# Patient Record
Sex: Female | Born: 1990 | Race: White | Hispanic: No | Marital: Single | Smoking: Never smoker
Health system: Southern US, Community
[De-identification: ages and names within clinical notes are randomized; demographics above are authoritative.]

## PROBLEM LIST (undated history)

## (undated) DIAGNOSIS — F419 Anxiety disorder, unspecified: Secondary | ICD-10-CM

## (undated) DIAGNOSIS — E282 Polycystic ovarian syndrome: Secondary | ICD-10-CM

## (undated) DIAGNOSIS — K589 Irritable bowel syndrome without diarrhea: Secondary | ICD-10-CM

## (undated) DIAGNOSIS — R5383 Other fatigue: Secondary | ICD-10-CM

## (undated) DIAGNOSIS — Z87440 Personal history of urinary (tract) infections: Secondary | ICD-10-CM

## (undated) DIAGNOSIS — K279 Peptic ulcer, site unspecified, unspecified as acute or chronic, without hemorrhage or perforation: Secondary | ICD-10-CM

## (undated) DIAGNOSIS — Z8619 Personal history of other infectious and parasitic diseases: Secondary | ICD-10-CM

## (undated) DIAGNOSIS — Z8719 Personal history of other diseases of the digestive system: Secondary | ICD-10-CM

## (undated) DIAGNOSIS — E063 Autoimmune thyroiditis: Secondary | ICD-10-CM

## (undated) DIAGNOSIS — G8929 Other chronic pain: Secondary | ICD-10-CM

## (undated) DIAGNOSIS — R51 Headache: Secondary | ICD-10-CM

## (undated) DIAGNOSIS — E049 Nontoxic goiter, unspecified: Secondary | ICD-10-CM

## (undated) DIAGNOSIS — R1013 Epigastric pain: Secondary | ICD-10-CM

## (undated) DIAGNOSIS — E079 Disorder of thyroid, unspecified: Secondary | ICD-10-CM

## (undated) DIAGNOSIS — E669 Obesity, unspecified: Secondary | ICD-10-CM

## (undated) DIAGNOSIS — N915 Oligomenorrhea, unspecified: Secondary | ICD-10-CM

## (undated) HISTORY — DX: Other fatigue: R53.83

## (undated) HISTORY — DX: Epigastric pain: R10.13

## (undated) HISTORY — DX: Anxiety disorder, unspecified: F41.9

## (undated) HISTORY — DX: Other chronic pain: G89.29

## (undated) HISTORY — DX: Disorder of thyroid, unspecified: E07.9

## (undated) HISTORY — DX: Nontoxic goiter, unspecified: E04.9

## (undated) HISTORY — DX: Autoimmune thyroiditis: E06.3

## (undated) HISTORY — PX: OTHER SURGICAL HISTORY: SHX169

## (undated) HISTORY — DX: Oligomenorrhea, unspecified: N91.5

## (undated) HISTORY — DX: Polycystic ovarian syndrome: E28.2

## (undated) HISTORY — DX: Peptic ulcer, site unspecified, unspecified as acute or chronic, without hemorrhage or perforation: K27.9

## (undated) HISTORY — DX: Obesity, unspecified: E66.9

## (undated) HISTORY — PX: WISDOM TOOTH EXTRACTION: SHX21

## (undated) HISTORY — DX: Personal history of other infectious and parasitic diseases: Z86.19

## (undated) HISTORY — PX: KNEE SURGERY: SHX244

## (undated) HISTORY — DX: Personal history of urinary (tract) infections: Z87.440

## (undated) HISTORY — PX: BAND HEMORRHOIDECTOMY: SHX1213

## (undated) HISTORY — DX: Personal history of other diseases of the digestive system: Z87.19

## (undated) HISTORY — DX: Headache: R51

---
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
## 2008-02-26 ENCOUNTER — Ambulatory Visit: Payer: Self-pay | Admitting: "Endocrinology

## 2008-05-14 ENCOUNTER — Telehealth: Payer: Self-pay | Admitting: Internal Medicine

## 2008-08-02 ENCOUNTER — Ambulatory Visit: Payer: Self-pay | Admitting: "Endocrinology

## 2009-05-11 IMAGING — CT CT ENTERO ABD W/CM
2 of 8 series · 15 of 46 positions shown, 19 images · IV contrast (Omnipaque 300)
Comparison: Abdominal ultrasound 05/16/2007.

CT ABDOMEN:

CLINICAL DATA: 16-year-old with nausea, vomiting, epigastric pain
and weight loss.  Question Crohn disease.

CT ABDOMEN AND PELVIS WITH CONTRAST (CT ENTEROGRAPHY)
TECHNIQUE: Multidetector CT of the abdomen and pelvis during bolus
administration of intravenous contrast. Negative oral contrast
VoLumen was given.
Contrast:  100 ml Zmnipaque-AJJ intravenously.

[Series 4: abd_pel entero 2.0 b30f st · axial · 0.80mm/px · z∈[-328,+52]mm · 12 of 297 slices shown, 16 images]
[im 29/297  soft-tissue]
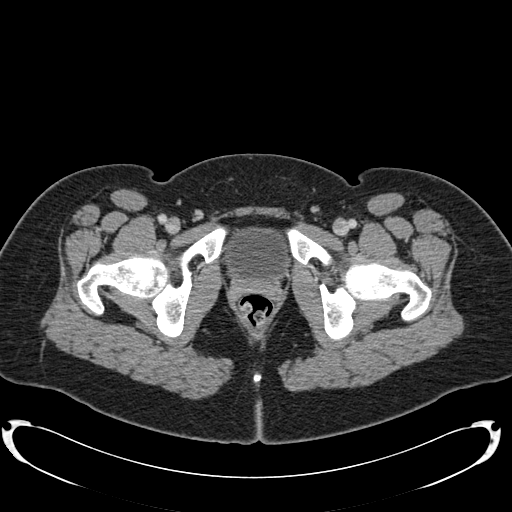
[im 29/297  bone]
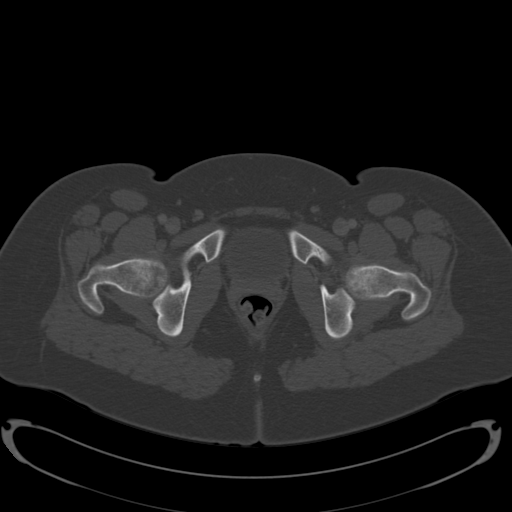
[im 57/297  soft-tissue]
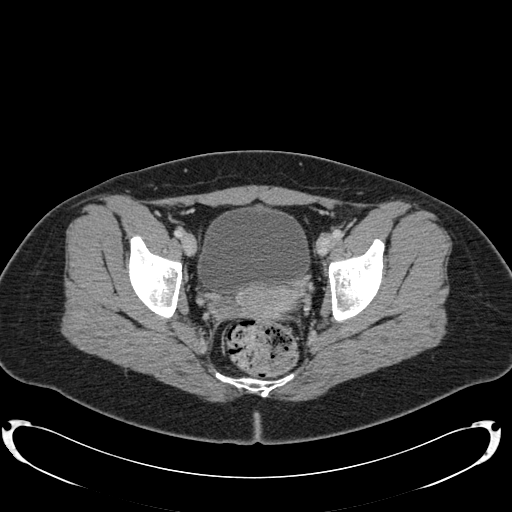
[im 85/297  soft-tissue]
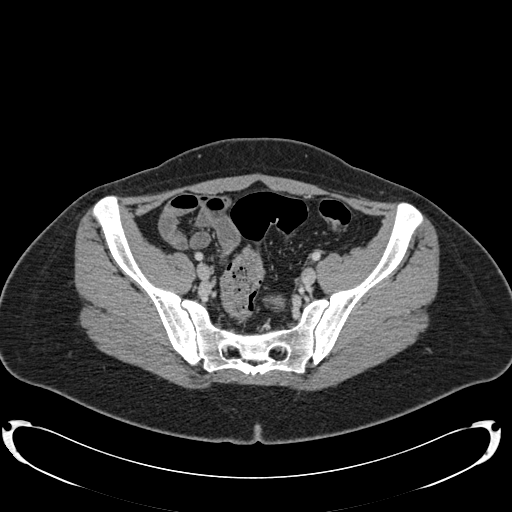
[im 113/297  soft-tissue]
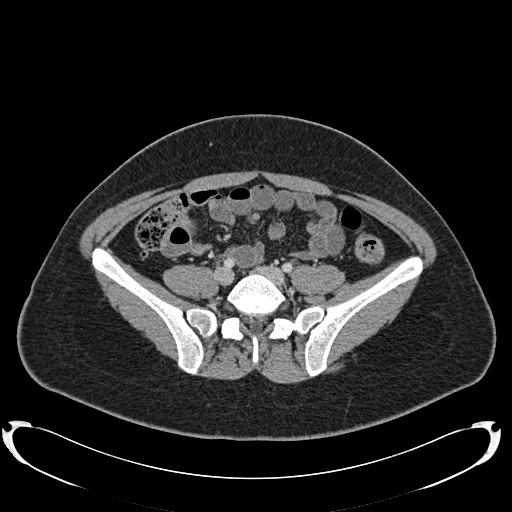
[im 141/297  soft-tissue]
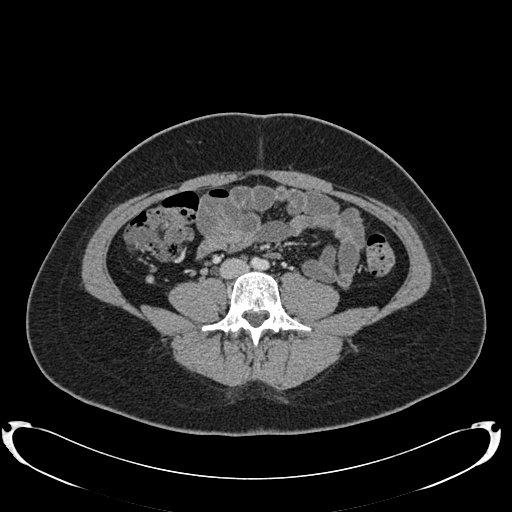
[im 170/297  soft-tissue]
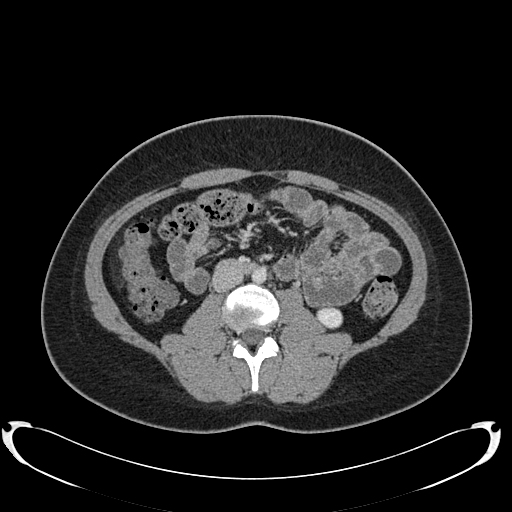
[im 198/297  soft-tissue]
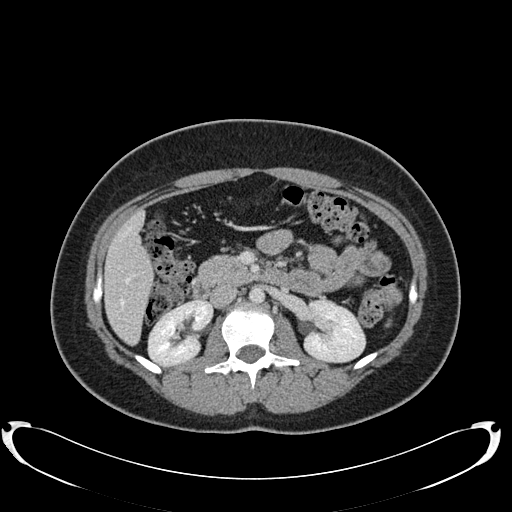
[im 226/297  soft-tissue]
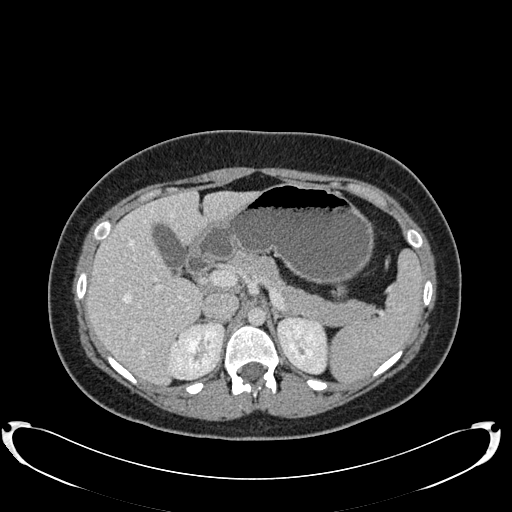
[im 240/297  lung]
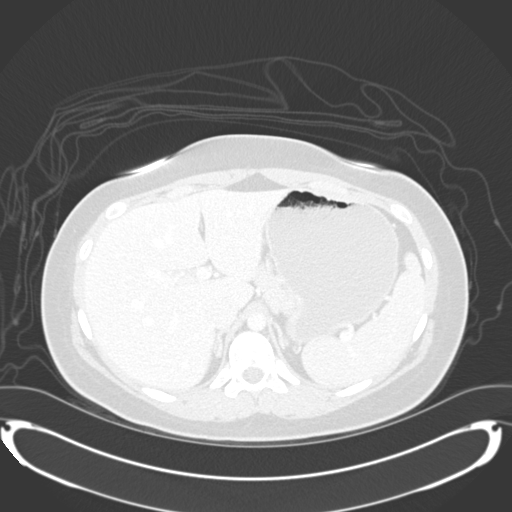
[im 254/297  soft-tissue]
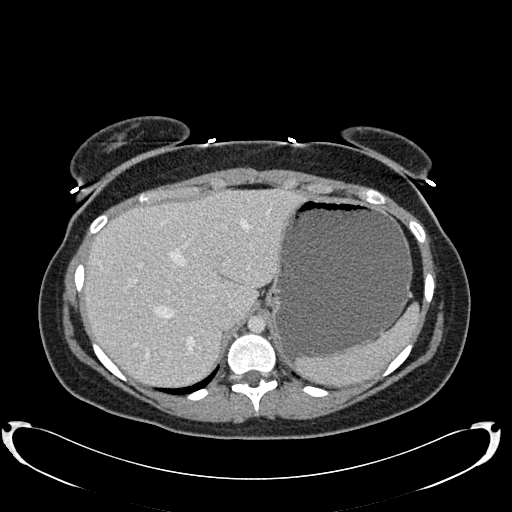
[im 254/297  lung]
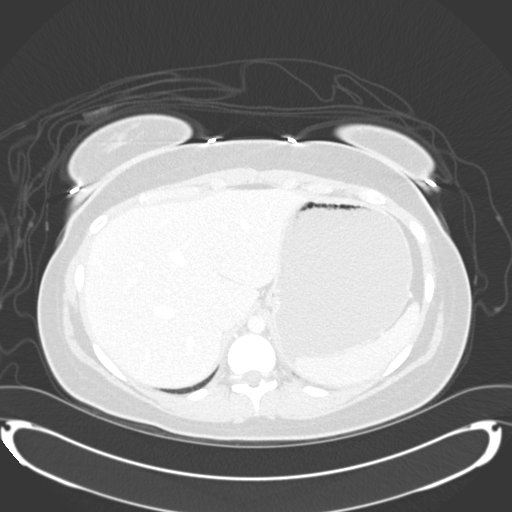
[im 254/297  bone]
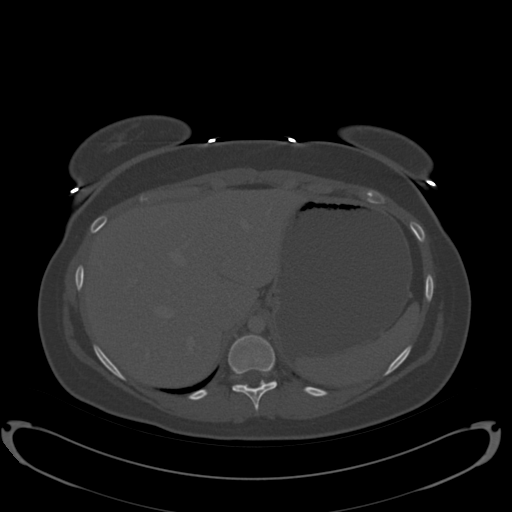
[im 268/297  lung]
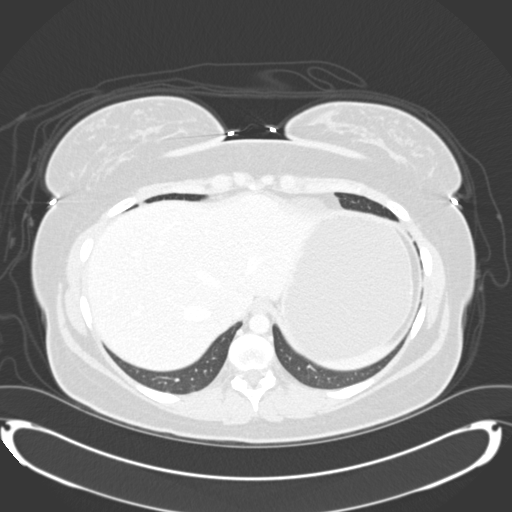
[im 282/297  soft-tissue]
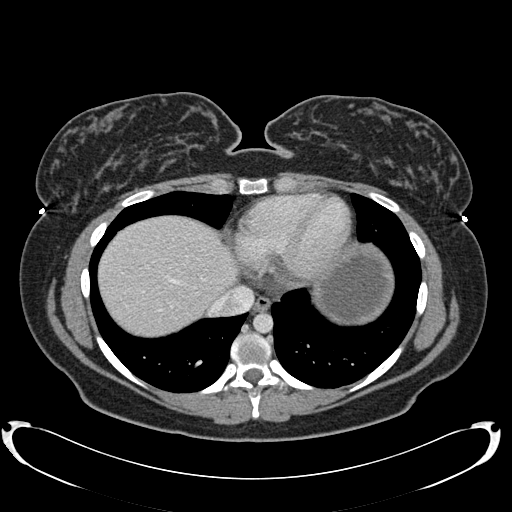
[im 282/297  lung]
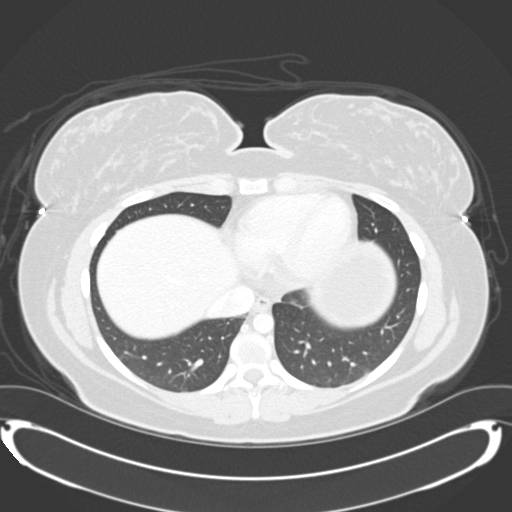

[Series 602: <mpr range> · coronal · 0.87mm/px · 3 of 50 slices shown]
[im 13/50  soft-tissue]
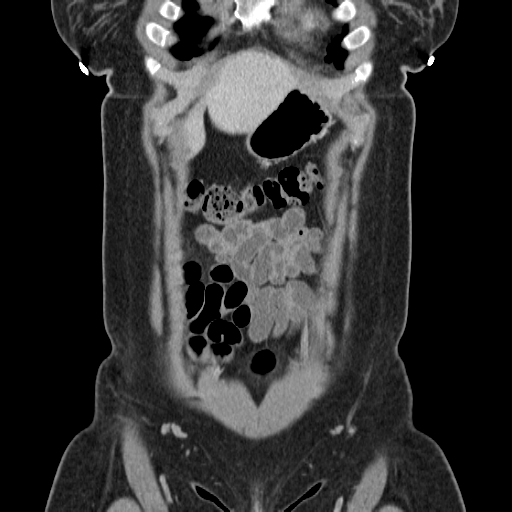
[im 25/50  soft-tissue]
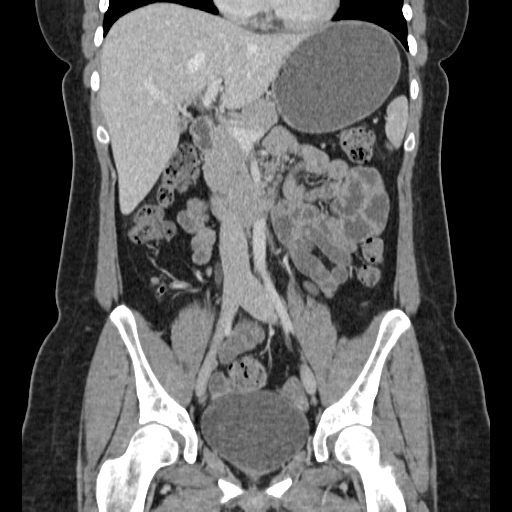
[im 37/50  soft-tissue]
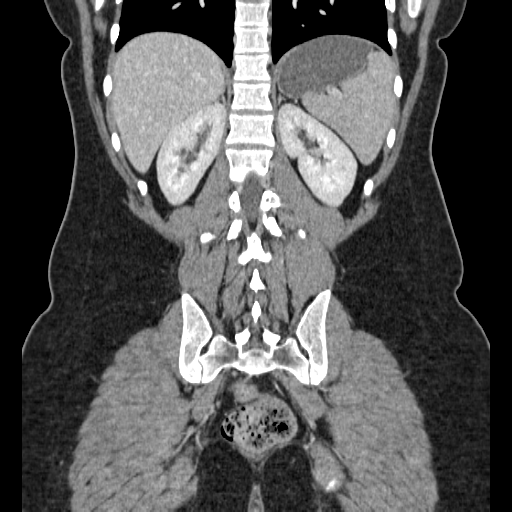

[15 of 46 positions shown; findings below may reference images not displayed]

FINDINGS: CT enterography images demonstrate no bowel wall
thickening, abnormal bowel wall enhancement or perienteric
inflammation.  Specifically, the terminal ileum and cecum appear
normal.  The appendix appears normal.  There are no enlarged
retroperitoneal lymph nodes.  There is no bowel distension or
ascites.

The lung bases are clear.  The gallbladder demonstrates a phrygian
cap but otherwise appears normal.  The liver, spleen, pancreas,
adrenal glands and kidneys appear normal.
IMPRESSION: Normal CT enterography.  No evidence of Crohn disease.

CT PELVIS:
FINDINGS: There is no bowel wall thickening, abnormal enhancement
or distension in the pelvis.  No pelvic mass, fluid collection or
inflammatory process is demonstrated.  The uterus and ovaries
appear normal.  There is no adenopathy or ascites.  The sacroiliac
joints appear normal without evidence of sacroiliitis.
IMPRESSION: Normal CT enterography of the pelvis.

## 2009-05-27 ENCOUNTER — Ambulatory Visit: Payer: Self-pay | Admitting: "Endocrinology

## 2009-10-08 ENCOUNTER — Ambulatory Visit (HOSPITAL_BASED_OUTPATIENT_CLINIC_OR_DEPARTMENT_OTHER): Admission: RE | Admit: 2009-10-08 | Discharge: 2009-10-08 | Payer: Self-pay | Admitting: Orthopaedic Surgery

## 2009-10-08 ENCOUNTER — Ambulatory Visit: Payer: Self-pay | Admitting: Diagnostic Radiology

## 2010-03-18 ENCOUNTER — Ambulatory Visit: Payer: Self-pay | Admitting: "Endocrinology

## 2010-12-25 ENCOUNTER — Other Ambulatory Visit: Payer: Self-pay | Admitting: "Endocrinology

## 2010-12-25 LAB — CLIENT PROFILE 3332: TSH: 1.692 u[IU]/mL (ref 0.350–4.500)

## 2010-12-29 NOTE — Assessment & Plan Note (Signed)
[facility]                         GASTROENTEROLOGY OFFICE NOTE   NAME:Catherine [name], Catherine [name]                      MRN:          [id number]  DATE:11/03/2007                            DOB:          04/06/1991    REFERRING PHYSICIAN:  David Stall, M.D.   REASON FOR VISIT:  Nausea, vomiting, and abdominal pain.   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  She is referred through the courtesy of Dr. Fransico Michael  regarding nausea, vomiting, and abdominal discomfort.  The patient is  accompanied today by her mother who provides a large portion of the  history.  Apparently,Catherine [name] has had a sensitive stomach for many  years as well as intermittent problems with constipation.  At one point,  she had problems with persistant left lower quadrant discomfort for  which she underwent a flexible sigmoidoscopy with Nance Pew in [employer].  Last September, she developed an acute gastroenteritis-type  illness with nausea, vomiting, and diarrhea.  She subsequently saw Dr.  Anselmo Rod ( a [facility] Gastroenterologist) for these persistant  symptoms.  She underwent an extensive workup, including upper endoscopy  and colonoscopy.  Upper endoscopy revealed nonspecific mild gastric  erosions.  Biopsies were obtained and revealed nonspecific gastritis.  No evidence of eosinophilic gastritis.  Biopsies of the duodenum were  normal.  No evidence of sprue.  Colonoscopy was entirely normal.  The  ileum was said to have some small erosions.  However, biopsies returned  normal.  Other testing included a normal abdominal ultrasound, normal  hepatobiliary scan, and normal blood work, including CBC, comprehensive  metabolic panel, and thyroid-stimulating hormone.  She also had multiple  stool studies which were unremarkable.  Finally, a breath test was  ordered.  There was no evidence of lactose malabsorption.  There was a  question of mild bacterial overgrowth for which Flagyl and Flora Q  treatment were ordered.  The patient and her mother were concerned that  the ibuprofen that she uses for bad menstrual cramps as well as birth  control pill for her ovarian disease may have contributed to symptoms.  As such, these were discontinued in the late fall.  Through December,  January, and February, she did well.  In February, she resumed birth  control as well as ibuprofen.  Now, for the past several weeks, she has  been having intermittent problems with nausea, vomiting, and epigastric  pain.  Some diarrhea, though not prominent.  She is now referred.  The  patient denies that the pain precedes the episodes of nausea and  vomiting.  She does have some reflux symptoms as manifested by  substernal burning.  For this, she has been on Nexium 40 mg b.i.d.  This  helps.  She does report that at times her vomitus contains food stuff  eaten many hours previous.  No significant nocturnal symptoms.  She has  had no change in her weight since last fall. She has chronic migraines  and wants an alternative to NSAIDS.  They plan to see a neurologist in  the near future.  Her mother is concerned that this illness may affect  her  school attendance.   PAST MEDICAL HISTORY:  1. Hashimoto's thyroiditis.  Apparently euthyroid at this time.  2. Migraine headaches.  3. Polycystic ovarian disease.  4. Gastroesophageal reflux disease.   PAST SURGICAL HISTORY:  1. Hemorrhoidectomy.  2. Surgery on her femur in 2003.   FAMILY HISTORY:  Maternal grandmother with colon cancer as well as colon  polyps.  Grandfather with diabetes.   SOCIAL HISTORY:  The patient is single without children.  She lives with  her parents.  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  She does not smoke or use alcohol.   REVIEW OF SYSTEMS:  Per diagnostic  evaluation form.  Last menstrual  period October 15, 2007.   PHYSICAL EXAMINATION:  GENERAL:  Pleasant, well-appearing female in no  acute distress.  VITAL SIGNS:  Blood pressure 100/58, heart rate 80 and regular, weight  174 pounds.  She is 5 feet 4 inches in height.  HEENT:  Sclerae are anicteric.  Conjunctivae are pink.  Oral mucosa is  intact.  There is no thrush.  There are no aphthous ulcers.  No  adenopathy.  LUNGS:  Clear.  HEART:  Regular.  ABDOMEN:  Soft without tenderness, mass, or hernia.  Good bowel sounds  are heard.  EXTREMITIES:  Without edema.   IMPRESSION:  An 20 year old female who presents today with 2-week  history of intermittent problems with nausea and vomiting.  Similar  problems last fall.  She certainly has reflux disease which may be a  contributor.  In addition, I wonder if she may have gastroparesis. At  this point, I do not find compelling evidence for a serious underlying  condition given the extensive relatively recent negative testing as well  as an absence of alarm features by history.  She could certainly have  intolerance to nonsteroidal anti-inflammatory drugs and/or hormones as a  primary cause.   RECOMMENDATIONS:  1. Repeat laboratories to compare to last fall to look for any      interval change to suggest a chronic illness.  2. Schedule solid phase gastric emptying scan to exclude gastroparesis      of significance.  3. Prescribe Zofran 4 mg q.4h. p.r.n. nausea.  We did discuss that she      may wish to try this on a regular basis for the time being in an      effort to break the cycle of vomiting.  4. Continue b.i.d. proton pump inhibitor therapy.  5. Keep scheduled appointment with neurologist regarding recent      problems with migraine      headaches.  6. GI office followup in about 4 weeks.     Wilhemina Bonito. Marina Goodell, MD  Electronically Signed    JNP/MedQ  DD: 11/05/2007  DT: 11/05/2007  Job #: [id number]   cc:   David Stall,  M.D.

## 2010-12-29 NOTE — Assessment & Plan Note (Signed)
[facility]                         GASTROENTEROLOGY OFFICE NOTE   NAME:Catherine [name], Catherine [name]                      MRN:          [id number]  DATE:12/04/2007                            DOB:          July 09, 1991    HISTORY:  Catherine [name] presents today for followup regarding intermittent  problems with nausea, vomiting, and abdominal pain.  She was evaluated  on November 03, 2007 as a new [facility] patient for the same.  See that  dictation for details.  Multiple repeat laboratories including CBC,  comprehensive metabolic panel, thyroid-stimulating hormone were normal.  Her albumin was 3.4.  Sedimentation rate was mildly elevated at 30 and C-  reactive protein mildly elevated at 12.  The patient was prescribed  Zofran which she states she did not tolerate due to constipation.  She  also underwent solid phase gastric emptying scan which was normal.  She is accompanied today by her mother.  They tell me that she developed  some problems with itching and rash which was treated with prednisone  and antihistamines.  She thought maybe this made her GI symptoms better.  Over the past week or so she has felt quite well without nausea,  vomiting, or pain.  Her mother has quite a number of questions today  some of which are  relevant while others irrelevant.  They were answered  to the best of my ability.   CURRENT MEDICATIONS:  Nexium, birth control pill, and Benadryl p.r.n.   PHYSICAL EXAMINATION:  GENERAL:  Finds a well-appearing female in no  acute distress.  VITAL SIGNS:  Blood pressure 100/60, heart rate 88, weight is 176.2  pounds (increased 2.2 pounds).  HEENT:  Sclerae are anicteric.  Oral mucosa is intact.  LUNGS:  Clear.  HEART:  Regular.  ABDOMEN:  Obese and soft without tenderness, mass, or hernia.  EXTREMITIES:  Without edema.  SKIN:  Unremarkable.  No evidence of rash.   IMPRESSION:  Intermittent problems with nausea, vomiting, and abdominal  pain of  uncertain cause.  Negative workup to date.   Of interest question of endoscopic ileitis (but biopsy negative) from  Dr. Loreta Ave in the past. Also, minimal elevation of inflammatory markers.  One wonders if she could have occult Crohn disease.  Also, the history  of improvement with antihistamines and prednisone, could this patient  have C1 esterase inhibitor deficiency with intestinal angioedema.   RECOMMENDATIONS:  1. Schedule CT enterography to rule out Crohn's.  2. Prescribe Phenergan p.r.n. nausea as they have requested as an      alternative to Zofran.  3. The patient's mother is referred to her primary physician regarding      questions about Avian borne illnesses.     Wilhemina Bonito. Marina Goodell, MD  Electronically Signed    JNP/MedQ  DD: 12/04/2007  DT: 12/04/2007  Job #: [id number]   cc:   David Stall, M.D.

## 2010-12-31 ENCOUNTER — Encounter: Payer: Self-pay | Admitting: *Deleted

## 2010-12-31 DIAGNOSIS — E038 Other specified hypothyroidism: Secondary | ICD-10-CM

## 2010-12-31 DIAGNOSIS — E282 Polycystic ovarian syndrome: Secondary | ICD-10-CM | POA: Insufficient documentation

## 2010-12-31 DIAGNOSIS — E669 Obesity, unspecified: Secondary | ICD-10-CM

## 2011-03-29 ENCOUNTER — Ambulatory Visit (INDEPENDENT_AMBULATORY_CARE_PROVIDER_SITE_OTHER): Payer: Managed Care, Other (non HMO) | Admitting: "Endocrinology

## 2011-03-29 VITALS — BP 105/72 | HR 87 | Wt 170.4 lb

## 2011-03-29 DIAGNOSIS — F32A Depression, unspecified: Secondary | ICD-10-CM

## 2011-03-29 DIAGNOSIS — R5381 Other malaise: Secondary | ICD-10-CM

## 2011-03-29 DIAGNOSIS — E669 Obesity, unspecified: Secondary | ICD-10-CM

## 2011-03-29 DIAGNOSIS — E049 Nontoxic goiter, unspecified: Secondary | ICD-10-CM

## 2011-03-29 DIAGNOSIS — F329 Major depressive disorder, single episode, unspecified: Secondary | ICD-10-CM

## 2011-03-29 DIAGNOSIS — R5383 Other fatigue: Secondary | ICD-10-CM

## 2011-03-29 DIAGNOSIS — F341 Dysthymic disorder: Secondary | ICD-10-CM

## 2011-03-29 DIAGNOSIS — E063 Autoimmune thyroiditis: Secondary | ICD-10-CM

## 2011-03-29 NOTE — Progress Notes (Addendum)
Subjective:  Patient Name: Catherine [name] Date of Birth: January 24, 1991  MRN: [id number]  Zaharah Amir  presents to the office today for follow-up of her oligomenorrhea, PCOS, goiter, Hashimoto's thyroiditis, obesity, nausea and vomiting, dyspepsia, hypothyroidism, and fatigue.   HISTORY OF PRESENT ILLNESS:   Catherine [name] is a 20 y.o. Caucasian young woman.  Catherine [name] was unaccompanied.  1. The patient was first referred to me on 09/09/06 by her gynecologist, Dr. Marcelle Overlie, for evaluation and management of secondary amenorrhea. She was then 20 years old.  A. Patient had been essentially healthy up through age 47-13, when she underwent menarche. Her menstrual cycles have never been regular. Her last normal menstrual period occurred in about 2005. She was not on any oral contraceptives  or contraceptive implants at that time. Another obstetrician put her on oral contraceptives at that point. She gained about 20 pounds in weight in that first year on oral contraceptives. She started evaluation with Dr. Vincente Poli in 2007 or so. Dr. Vincente Poli put her on Yaz initially, but later converted her to [facility].The patient's past medical history was positive for a lot of depression, mood swings, and panic attacks. She also had 2 knee surgeries in the seventh grade. She did not do any type of routine physical activity. Family history was positive for diabetes in the paternal grandfather and hypothyroidism in the paternal grandmother.  Maternal grandmother had colon cancer. Mother was obese. Mother also had endometriosis.  B. On physical examination, her height was at the 35th percentile and her weight at 177.6 pounds was at the 93rd percentile. Her BMI was 30.7. She had a 25+ gram thyroid gland. She was tender on the right lobe. She also had 1+ acanthosis nigricans. Previous laboratory data from 06/21/06 showed a normal CMP. Her TSH was 2.05 and her free T4 was 1.18. A serum glucose was 91 with a corresponding insulin level of 95.  Laboratory data at our visit included a blood glucose of 79 and hemoglobin A1c of 5.0%. Subsequent lab tests on 09/12/06 showed a fasting glucose of 72 and a fasting insulin of 22. It appeared at that time that the patient was mildly obese. Her level of obesity was high enough, however, to cause a significant degree of insulin resistance and hyperinsulinemia. Her level of obesity may also have been high enough to cause oligomenorrhea. She had a goiter and clinical evidence of Hashimoto's disease, but she was euthyroid. I talked with the patient and her mother about our Eat Right Diet plan. I also talked about exercising 45-60 minutes a day. I started her on metformin, 500 mg, twice daily. 2. During the past 4-1/2 years, patient's weight has fluctuated up and down, varying between 150.7-179.4 lbs. She has continued to have flare ups of Hashimoto's disease. On 02/12/08 her TSH rose to 4.493, but then subsequently normalized. She has been essentially euthyroid for the entire time period. At times she's been on birth control patches, but most times has been on birth control pills. Her dyspepsia is better when she takes her Nexium and worse when she does not. The patient's last [facility] visit was on 03/18/10. At that point her weight was 150.7 pounds. She was then taking Loestrin FE, which she felt was "wonderful". In the interim, she started Paxil in December. This medication has significantly reduced her anxiety. Her life is much better. Everything has leveled out. She also had an ovarian cyst rupture in May. She was put on birth control at that time. Restarting her birth control pills  resulted in migraines. Topamax was added to control migraine. 3. Pertinent Review of Systems:  Constitutional: The patient feels "pretty good". She is sleeping well. She has more energy. She is much less depressed. She has no significant complaints. Eyes: Vision is good. There are no significant eye complaints. Neck: The patient has no  complaints of anterior neck swelling, soreness, tenderness,  pressure, discomfort, or difficulty swallowing.  Heart: Heart rate increases with exercise or other physical activity. The patient has no complaints of palpitations, irregular heat beats, chest pain, or chest pressure. Gastrointestinal: She gets a "nervous stomach" under stress. Stress tends to cause diarrhea. This has not been as much of a problem since she's been on Paxil. Bowel movents seem normal. The patient has no complaints of excessive hunger, acid reflux, upset stomach, stomach aches or pains, diarrhea, or constipation. Legs: Muscle mass and strength seem normal. There are no complaints of numbness, tingling, burning, or pain. No edema is noted. Feet: There are no obvious foot problems. There are no complaints of numbness, tingling, burning, or pain. No edema is noted. GYN/GU: On oral contraceptives.   PAST MEDICAL, FAMILY, AND SOCIAL HISTORY:  Past Medical History  Diagnosis Date  . Anxiety   . Chronic headaches   . Thyroid disease   . History of lower GI bleeding   . PUD (peptic ulcer disease)   . IBS (irritable bowel syndrome)   . PCOS (polycystic ovarian syndrome)   . Oligomenorrhea   . Goiter   . Hashimoto's thyroiditis   . Obesity   . Dyspepsia   . Hypothyroidism, acquired, autoimmune   . Fatigue     Family History  Problem Relation Age of Onset  . Breast cancer Maternal Grandmother   . Colon cancer Maternal Grandmother   . Colon polyps Maternal Grandmother   . Diabetes Paternal Grandfather   . Lupus Paternal Grandmother   . Osteopenia Mother     Current outpatient prescriptions:cetirizine (ZYRTEC) 10 MG tablet, Take 10 mg by mouth daily.  , Disp: , Rfl: ;  Norethindrone-Ethinyl Estradiol-Fe (GENERESS FE) 0.8-25 MG-MCG tablet, Chew 1 tablet by mouth daily.  , Disp: , Rfl: ;  PARoxetine (PAXIL) 20 MG tablet, Take 20 mg by mouth every morning.  , Disp: , Rfl: ;  topiramate (TOPAMAX) 25 MG capsule, Take 25  mg by mouth 3 (three) times daily.  , Disp: , Rfl:  busPIRone (BUSPAR) 10 MG tablet, One tablet by mouth twice daily , Disp: , Rfl: ;  dicyclomine (BENTYL) 10 MG capsule, Take 1 tablet by mouth every 4 hours as needed for pain., Disp: 120 capsule, Rfl: 0;  loperamide (IMODIUM A-D) 2 MG tablet, Take as directed, Disp: 1 tablet, Rfl: 0;  Probiotic Product (ALIGN) 4 MG CAPS, Take 1 capsule by mouth daily., Disp: 21 capsule, Rfl: 0  Allergies as of 03/29/2011 - Review Complete 03/29/2011  Allergen Reaction Noted  . Nsaids  03/29/2011    1. 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 2. Activities: Not much physical activity 3. Smoking, alcohol, or drugs: None 4. Primary Care Provider: The patient no longer has a PCP  ROS: There are no other significant problems involving [place]'s other body systems.   Objective:  Vital Signs:  BP 105/72  Pulse 87  Wt 170 lb 6.4 oz (77.293 kg)   Ht Readings from Last 3 Encounters:  06/21/11 5\' 6"  (1.676 m)  06/03/11 5\' 6"  (1.676 m)  05/11/11 5\' 6"  (1.676 m)   Wt Readings from Last 3 Encounters:  06/21/11 170 lb (77.111 kg)  06/03/11 169 lb (76.658 kg)  05/11/11 172 lb (78.019 kg)   There is no height on file to calculate BSA.  Normalized stature-for-age data available only for age 67 to 20 years. Normalized weight-for-age data available only for age 67 to 20 years.   PHYSICAL EXAM:  Constitutional: The patient appears upbeat, perky, and very chatty. She has gained 20 pounds since her last visit. Face: The face appears normal.  Eyes: There is no obvious arcus or proptosis. Moisture appears normal. Mouth: The oropharynx and tongue appear normal. Dentition appears to be normal for age. Oral moisture is normal. Neck: The neck appears to be visibly normal. No carotid bruits are noted. The thyroid gland is 20+ grams in size. Her thyroid isthmus is  fairly large. The consistency of the thyroid gland is normal. The left lobe of the thyroid gland is mildly tender to palpation. Lungs: The lungs are clear to auscultation. Air movement is good. Heart: Heart rate and rhythm are regular. Heart sounds S1 and S2 are normal. I did not appreciate any pathologic cardiac murmurs. Abdomen: The abdomen appears to be normal in size. Bowel sounds are normal. There is no obvious hepatomegaly, splenomegaly, or other mass effect.  Arms: Muscle size and bulk are normal for age. Hands: There is no obvious tremor. Phalangeal and metacarpophalangeal joints are normal. Palmar muscles are normal. Palmar skin is normal. Palmar moisture is also normal. Legs: Muscles appear normal for age. No edema is present. Neurologic: Strength is normal for age in both the upper and lower extremities. Muscle tone is normal. Sensation to touch is normal in both legs.    LAB DATA: 12/28/10: TSH was 1.692. Free T4 0.99. Free T3 was 3.0.    Assessment and Plan:   ASSESSMENT:  1. Thyroiditis: The patient's Hashimoto's disease is active today. She was mid-range euthyroid in May. 2. Goiter: The thyroid gland is somewhat smaller today. 3. Obesity: Her weight is worse. 4.  Dyspepsia: She is doing better. 5. Fatigue: She is doing better. 6. Anxiety and depression: There has been a wonderful improvement with Paxil.  PLAN:  1. Diagnostic: No lab tests will be ordered today. We can repeat her lab tests at the next visit.  2. Therapeutic: Eat right and exercise right. 3. Patient education: Although Paxil was not very likely to cause increased weight gain, Topamax is more likely to do so. However, if she is exercising 45-60 minutes per day and being careful with the amount of calories she takes in, she can lose weight again. 4. Follow-up: Return in about 6 months (around 09/29/2011).  Level of Service: This visit lasted in excess of 40 minutes. More than 50% of the visit was devoted to  counseling.  David Stall, MD 09/05/2011 10:27 PM

## 2011-03-29 NOTE — Patient Instructions (Signed)
Follow-up in 6 months. Please resume Eat right Diet and daily exercise regimen.

## 2011-05-09 ENCOUNTER — Telehealth: Payer: Self-pay | Admitting: Internal Medicine

## 2011-05-09 NOTE — Telephone Encounter (Signed)
Mother called this AM- patient is a 20 yo woman with rectal bleeding in setting of chronic diarrhea. Last saw Dr. Marina Goodell in 2009. 15 mins rectal bleeding yesterday - none since. Has been having some intermittently. I advised that urgent care evaluation today was appropriate - and that we would contact her from the office (she is requesting follow-up). Has not seen Dr. Marina Goodell since 2009.

## 2011-05-10 NOTE — Telephone Encounter (Signed)
Pt last seen by Dr. Marina Goodell in 2009 for nausea, diarrhea, and abdominal pain. Mother states that she has been having problems with diarrhea for quite a while and is now having rectal bleeding. Mother is requesting she be seen. Pt scheduled to see Mike Gip PA 05/11/11@9am .

## 2011-05-11 ENCOUNTER — Other Ambulatory Visit (INDEPENDENT_AMBULATORY_CARE_PROVIDER_SITE_OTHER): Payer: 59

## 2011-05-11 ENCOUNTER — Ambulatory Visit (INDEPENDENT_AMBULATORY_CARE_PROVIDER_SITE_OTHER): Payer: 59 | Admitting: Physician Assistant

## 2011-05-11 ENCOUNTER — Encounter: Payer: Self-pay | Admitting: Physician Assistant

## 2011-05-11 VITALS — BP 90/58 | HR 80 | Ht 66.0 in | Wt 172.0 lb

## 2011-05-11 DIAGNOSIS — K625 Hemorrhage of anus and rectum: Secondary | ICD-10-CM

## 2011-05-11 DIAGNOSIS — K589 Irritable bowel syndrome without diarrhea: Secondary | ICD-10-CM

## 2011-05-11 LAB — CBC WITH DIFFERENTIAL/PLATELET
Basophils Absolute: 0 10*3/uL (ref 0.0–0.1)
HCT: 38.5 % (ref 36.0–46.0)
Hemoglobin: 13.2 g/dL (ref 12.0–15.0)
Lymphs Abs: 3.1 10*3/uL (ref 0.7–4.0)
MCHC: 34.2 g/dL (ref 30.0–36.0)
MCV: 89.3 fl (ref 78.0–100.0)
Monocytes Absolute: 0.6 10*3/uL (ref 0.1–1.0)
Monocytes Relative: 7 % (ref 3.0–12.0)
Neutro Abs: 5.1 10*3/uL (ref 1.4–7.7)
RDW: 12.4 % (ref 11.5–14.6)

## 2011-05-11 MED ORDER — HYDROCORTISONE ACETATE 25 MG RE SUPP
RECTAL | Status: DC
Start: 2011-05-11 — End: 2011-06-03

## 2011-05-11 MED ORDER — ALIGN 4 MG PO CAPS: 1.0000 | ORAL_CAPSULE | Freq: Every day | ORAL | Status: DC

## 2011-05-11 NOTE — Progress Notes (Signed)
Agree with initial assessment and plan 

## 2011-05-11 NOTE — Patient Instructions (Signed)
Please go to the basement level to have your labs drawn. . We have faxed a prescription to [facility] in [facility] for suppositories.  We have given you a note for school. Call us back in 2 weeks if you have not improved.

## 2011-05-11 NOTE — Progress Notes (Signed)
Subjective:    Patient ID: Catherine [name], female    DOB: 06-11-1991, 20 y.o.   MRN: [id number]  HPI Catherine [name] is a pleasant 20 year old white female known to Dr. Marina Goodell from an evaluation in 2009. She does have history of Hashimoto's  thyroiditis and polycystic ovarian syndrome. She had undergone fairly extensive GI workup with Dr. Ranae Palms , in  2008 and then saw Dr. Marina Goodell in 2009. She underwent upper endoscopy and colonoscopy both of which were negative with the exception of small internal hemorrhoids and biopsies of the duodenum were negative for sprue and terminal ileal biopsies were unremarkable. She underwent CT enterography . in 2009 again to rule out Crohn's and this was negative.  Patient relates that she has had chronic ongoing problems with diarrhea which generally has been mild. She says she always has loose stools but may have good days without any bowel movements and some days has 1-2 loose stools. She intermittently has some abdominal cramping and discomfort and has noted scant amount of bright red blood on the tissue very sporadically. She comes in today after taking a course of Augmentin for a sinus infection which she completed a little over a week ago. She says that while on the antibiotic she had a significant amount of abdominal discomfort cramping and frequent diarrhea than her baseline. This past weekend she had an episode of rectal bleeding and says that she had dripping of blood into the commode for about 15 minutes and had an increase in abdominal pain after that episode. She has not had any bowel movement since nor any further bleeding. She says she saw a lot of blood, enough to worry her. She complains of increasing abdominal wall bloating discomfort and nausea all of which were worse while she was on the antibiotics.  Patient does have a cousin who has been labeled gluten intolerant. Maternal grandmother had colon cancer.    Review of Systems  Constitutional: Negative.     HENT: Negative.   Eyes: Negative.   Respiratory: Negative.   Cardiovascular: Negative.   Gastrointestinal: Positive for abdominal pain, diarrhea and anal bleeding.  Genitourinary: Negative.   Musculoskeletal: Negative.   Neurological: Negative.   Hematological: Negative.   Psychiatric/Behavioral: The patient is nervous/anxious.    Outpatient Prescriptions Prior to Visit  Medication Sig Dispense Refill  . cetirizine (ZYRTEC) 10 MG tablet Take 10 mg by mouth daily.        . Norethindrone-Ethinyl Estradiol-Fe (GENERESS FE) 0.8-25 MG-MCG tablet Chew 1 tablet by mouth daily.        [name] PARoxetine (PAXIL) 20 MG tablet Take 20 mg by mouth every morning.        . topiramate (TOPAMAX) 25 MG capsule Take 25 mg by mouth 3 (three) times daily.         Allergies  Allergen Reactions  . Nsaids        Objective:   Physical Exam Well-developed young white female in no acute distress, pleasant, accompanied by her mother. HEENT; nontraumatic normocephalic EOMI PERRLA sclera anicteric Neck supple no JVD Cardiovascular; regular rate and rhythm with S1-S2 no murmur or gallop Pulmonary; clear bilaterally Abdomen; soft minimally tender bilateral lower quadrants no guarding no rebound no masses or palpable hepatosplenomegaly, bowel sounds active, Recta;l no external hemorrhoids scant stool trace Hemoccult positive, on anoscopy she has an inflamed internal hemorrhoid, nonthrombosed and is nontender to exam, skin pale warm dry benign, Psych; mood and affect normal an appropriate.  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 Patient also with an episode of hematochezia 3 days ago, since resolved. Internal hemorrhoids on exam  Plan; Reassurance, and discussion with patient and her mother regarding antibiotic-induced of the flare of irritable bowel syndrome. Start align,one by mouth daily x30 days, and advised  aconcurrent course of probiotics whenever she has to take antibiotics in the future. Start Anusol HC suppositories each bedtime x7 days then as needed Check CBC today Advised patient to call back for followup in 2 weeks if her symptoms are not significantly improved  #2 Chronic anxiety  #3 Positive family history of colon cancer, maternal grandmother

## 2011-05-12 ENCOUNTER — Telehealth: Payer: Self-pay | Admitting: *Deleted

## 2011-05-12 NOTE — Telephone Encounter (Signed)
Message copied by Daphine Deutscher on Wed May 12, 2011 10:06 AM ------      Message from: [place], [place] S      Created: Wed May 12, 2011 10:03 AM       Please let Consuella know her cbc is normal, no anemia

## 2011-05-12 NOTE — Telephone Encounter (Signed)
Spoke with patient and gave her the results as per Amy Esterwood, PA 

## 2011-05-24 ENCOUNTER — Telehealth: Payer: Self-pay | Admitting: Internal Medicine

## 2011-05-28 NOTE — Telephone Encounter (Signed)
Pt is still having problems with diarrhea and her IBS. Pt scheduled to see Mike Gip PA on 06/03/11@3 :30pm, Dr. Marina Goodell is the supervising physician that day. Pt has had to drop out of school due to her stomach issues per pts mother. Pt aware of appt date and time.

## 2011-06-02 ENCOUNTER — Telehealth: Payer: Self-pay

## 2011-06-02 ENCOUNTER — Telehealth: Payer: Self-pay | Admitting: Internal Medicine

## 2011-06-02 MED ORDER — TRAMADOL HCL 50 MG PO TABS: ORAL_TABLET | ORAL | Status: DC

## 2011-06-02 NOTE — Telephone Encounter (Signed)
Pt has an appt to see Mike Gip PA 06/03/11@9am . Dr. Marina Goodell had a cancellation 06/03/11@9 :15am. Pt moved to Dr. Lamar Sprinkles schedule. Pts mother aware.

## 2011-06-02 NOTE — Telephone Encounter (Signed)
Pts mother is calling stating that her daughter is complaining of a lot of lower abdominal pain. She has an appt tomorrow to see Catherine [name]. Catherine [name] pt has history of IBS. Pts mother states that she has had such problems with pain and diarrhea that she had to drop out of school. Mother is requesting some pain medication until she is seen tomorrow. States that about the only drug her GI tract can tolerate is hydrocodone. Dr. Rhea Belton as doc of the day please advise.

## 2011-06-02 NOTE — Telephone Encounter (Signed)
Pts mother aware and rx sent to the pharmacy.

## 2011-06-02 NOTE — Telephone Encounter (Signed)
Okay to give tramadol 50 mg every 6-8 hours when necessary pain. Dispense #10 No refills See Amy tomorrow and then follow with Marina Goodell

## 2011-06-03 ENCOUNTER — Ambulatory Visit (INDEPENDENT_AMBULATORY_CARE_PROVIDER_SITE_OTHER): Payer: 59 | Admitting: Internal Medicine

## 2011-06-03 ENCOUNTER — Ambulatory Visit: Payer: 59 | Admitting: Physician Assistant

## 2011-06-03 ENCOUNTER — Other Ambulatory Visit: Payer: Self-pay | Admitting: "Endocrinology

## 2011-06-03 ENCOUNTER — Other Ambulatory Visit (INDEPENDENT_AMBULATORY_CARE_PROVIDER_SITE_OTHER): Payer: 59

## 2011-06-03 ENCOUNTER — Encounter: Payer: Self-pay | Admitting: Internal Medicine

## 2011-06-03 VITALS — BP 98/64 | HR 80 | Ht 66.0 in | Wt 169.0 lb

## 2011-06-03 DIAGNOSIS — R197 Diarrhea, unspecified: Secondary | ICD-10-CM

## 2011-06-03 DIAGNOSIS — R1084 Generalized abdominal pain: Secondary | ICD-10-CM

## 2011-06-03 DIAGNOSIS — K589 Irritable bowel syndrome without diarrhea: Secondary | ICD-10-CM

## 2011-06-03 LAB — COMPREHENSIVE METABOLIC PANEL
ALT: 13 U/L (ref 0–35)
Alkaline Phosphatase: 74 U/L (ref 39–117)
CO2: 24 mEq/L (ref 19–32)
Creatinine, Ser: 0.9 mg/dL (ref 0.4–1.2)
GFR: 85.52 mL/min (ref >60.00)
Glucose, Bld: 87 mg/dL (ref 70–99)
Total Bilirubin: 0.5 mg/dL (ref 0.3–1.2)

## 2011-06-03 MED ORDER — METRONIDAZOLE 250 MG PO TABS: 250.0000 mg | ORAL_TABLET | Freq: Four times a day (QID) | ORAL | Status: AC

## 2011-06-03 NOTE — Patient Instructions (Signed)
Go to basement floor today and have labs Metronidazole sent to your pharmacy, please complete entire prescription. Follow-up in 2 weeks with Dr. Marina Goodell.

## 2011-06-03 NOTE — Progress Notes (Signed)
HISTORY OF PRESENT ILLNESS:  Catherine [name] is a 20 y.o. female with a history of chronic anxiety for which she sees a psychiatrist, Hashimoto's thyroiditis, chronic migraine headaches, and polycystic ovary disease. She also has a history of chronic GI complaints for which he has undergone extensive evaluation elsewhere (including, but not limited to, upper endoscopy and colonoscopy with biopsies) and here in 2009. Have not seen the patient since April of 2009. She was seen by our physician extender on 05/11/2011 with intermittent abdominal cramping, worsening loose stools, and scant red blood on the toilet tissue. This, one week after taking a course of Augmentin for a sinus infection. CBC was obtained and returned normal. She was placed on probiotic Align. She was treated with Anusol suppositories for obvious hemorrhoids on physical exam. She presents today for followup. States that her rectal bleeding has resolved. Still with multiple loose stools daily and sharp abdominal pain. No improvement with probiotic. She does have some nocturnal symptoms. She has lost a few pounds of weight. No fevers. It should be noted that her symptoms also began around the time that her psychiatric medications were adjusted or changed. Apparently she has discontinued the new medication that was previously prescribed. She is accompanied today by her mother. Her mother is distressed stating that this current illness has caused her daughter to dropout of college. 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 She requests that I draft a letter on her daughter's behalf to the school, regarding her illness. She hands me a paper with specific items to be included in that letter.   REVIEW OF SYSTEMS:  All non-GI ROS negative except for fatigue and headaches  Past Medical History  Diagnosis Date  . Anxiety   . Chronic headaches   . Thyroid disease   . History of lower GI bleeding   . PUD (peptic ulcer disease)     Past Surgical  History  Procedure Date  . Knee surgery     x 2  . Band hemorrhoidectomy   . Wisdom tooth extraction     Social History MIKE BERNTSEN  reports that she has never smoked. She has never used smokeless tobacco. She reports that she does not drink alcohol or use illicit drugs.  family history includes Breast cancer in her maternal grandmother; Colon cancer in her maternal grandmother; Colon polyps in her maternal grandmother; Diabetes in her paternal grandfather; Lupus in her paternal grandmother; and Osteopenia in her mother.  Allergies  Allergen Reactions  . Nsaids        PHYSICAL EXAMINATION:  Vital signs: BP 98/64  Pulse 80  Ht 5\' 6"  (1.676 m)  Wt 169 lb (76.658 kg)  BMI 27.28 kg/m2  LMP 04/27/2011 General: Well-developed, obese, well-nourished, no acute distress HEENT: Sclerae are anicteric, conjunctiva pink. Oral mucosa intact Lungs: Clear Heart: Regular Abdomen: soft, obese, nontender, nondistended, no obvious ascites, no peritoneal signs, normal bowel sounds. No organomegaly. Extremities: No edema Psychiatric: alert and oriented x3. Cooperative     ASSESSMENT:  #1. Acute diarrheal illness with abdominal cramping. Rule out infectious colitis. Rule out exacerbation of irritable bowel syndrome #2. Chronic abdominal complaints with previous extensive GI workups being negative. Likely irritable bowel syndrome #3. Anxiety #4. Recent withdrawal from school on patient's part allegedly due to illness   PLAN:  #1. Stool studies including Clostridium difficile by PCR, ova and parasites, enteric pathogens #2. Comprehensive metabolic panel, tissue transglutaminase antibody #3. Prescribe metronidazole 250 mg 4 times a day x10  days #4. Routine office followup in 2 weeks #5. Draft t the patient's college as requested

## 2011-06-07 ENCOUNTER — Telehealth: Payer: Self-pay

## 2011-06-07 LAB — TISSUE TRANSGLUTAMINASE, IGA: Tissue Transglutaminase Ab, IgA: 3.4 U/mL (ref <20)

## 2011-06-07 NOTE — Telephone Encounter (Signed)
Message copied by Michele Mcalpine on Mon Jun 07, 2011  3:29 PM ------      Message from: Hilarie Fredrickson      Created: Mon Jun 07, 2011  2:29 PM       The patient noted her blood work was normal including celiac sprue testing. Continue with plan as outlined on the day of her last office visit. Keep followup office appointment as recommended

## 2011-06-07 NOTE — Telephone Encounter (Signed)
Pt's mother aware.

## 2011-06-09 ENCOUNTER — Telehealth: Payer: Self-pay

## 2011-06-09 MED ORDER — DICYCLOMINE HCL 10 MG PO CAPS: ORAL_CAPSULE | ORAL | Status: DC

## 2011-06-09 NOTE — Telephone Encounter (Signed)
I am not comfortable providing hydrocodone for her abdominal pain. She is welcome to try Bentyl 10 mg every 4 hours prn pain (you may prescribe if they want to try). As for the latter, it will be ready at the time of her followup visit.

## 2011-06-09 NOTE — Telephone Encounter (Signed)
Pt aware. Rx for bentyl sent to the pharmacy.

## 2011-06-09 NOTE — Telephone Encounter (Signed)
Pts mother is calling stating they forgot to ask Dr. Marina Goodell for pain medicine at their last visit. She reports that they did not fill the Tramadol rx that Dr. Rhea Belton sent in due to a reaction with it and her Paxil. She also takes an anti-seizure med for migraines that interacts with pain meds. Mother states that Hydrocodone is the only thing that works well for her, she states she takes 1/2 tab along with a tylenol and this helps. Daughter cannot take NSAIDS.  Also asking about letter that was to be written to [facility] for pt to be readmitted to the [facility] since she withdrew due to medical reasons.  Dr. Marina Goodell please advise.

## 2011-06-11 ENCOUNTER — Ambulatory Visit: Payer: 59 | Admitting: Internal Medicine

## 2011-06-16 ENCOUNTER — Encounter: Payer: Self-pay | Admitting: Internal Medicine

## 2011-06-18 ENCOUNTER — Ambulatory Visit: Payer: 59

## 2011-06-18 ENCOUNTER — Other Ambulatory Visit: Payer: Self-pay | Admitting: *Deleted

## 2011-06-18 DIAGNOSIS — R197 Diarrhea, unspecified: Secondary | ICD-10-CM

## 2011-06-18 DIAGNOSIS — R1084 Generalized abdominal pain: Secondary | ICD-10-CM

## 2011-06-21 ENCOUNTER — Ambulatory Visit (INDEPENDENT_AMBULATORY_CARE_PROVIDER_SITE_OTHER): Payer: 59 | Admitting: Internal Medicine

## 2011-06-21 ENCOUNTER — Encounter: Payer: Self-pay | Admitting: Internal Medicine

## 2011-06-21 VITALS — BP 100/60 | HR 88 | Ht 66.0 in | Wt 170.0 lb

## 2011-06-21 DIAGNOSIS — R1084 Generalized abdominal pain: Secondary | ICD-10-CM

## 2011-06-21 DIAGNOSIS — R197 Diarrhea, unspecified: Secondary | ICD-10-CM

## 2011-06-21 DIAGNOSIS — F341 Dysthymic disorder: Secondary | ICD-10-CM

## 2011-06-21 DIAGNOSIS — F32A Depression, unspecified: Secondary | ICD-10-CM

## 2011-06-21 DIAGNOSIS — F419 Anxiety disorder, unspecified: Secondary | ICD-10-CM

## 2011-06-21 DIAGNOSIS — K589 Irritable bowel syndrome without diarrhea: Secondary | ICD-10-CM

## 2011-06-21 LAB — OVA AND PARASITE SCREEN: OP: NONE SEEN

## 2011-06-21 MED ORDER — LOPERAMIDE HCL 2 MG PO TABS: ORAL_TABLET | ORAL | Status: DC

## 2011-06-21 NOTE — Progress Notes (Signed)
HISTORY OF PRESENT ILLNESS:  Catherine [name] is a 20 y.o. female with chronic anxiety, Hashimoto's thyroiditis, chronic migraine headaches, polycystic ovary disease, and chronic functional GI complaints for which she has undergone extensive evaluation previously. She was last seen 06/03/2011 regarding worsening diarrhea and abdominal discomfort. The dictation for details. She was placed on metronidazole 250 mg 4 times a day x10 days. She has taken this on reliably. Still has several days of medication left. States that when she takes a medication a "freaks her out". She cannot be more specific. She tells me that she is having 5 loose bowel movements per day. She was prescribed Bentyl for abdominal pain, at their request. She has not tried this. She is due to see her psychiatrist. Still with some hemorrhoidal bleeding. No fevers or weight loss. She submitted stool studies the other day. These are all negative thus far. Clostridium difficile PCR is pending. Known to problems  REVIEW OF SYSTEMS:  All non-GI ROS negative except for sinus allergy, anxiety, fatigue, headaches, menstrual cramps.  Past Medical History  Diagnosis Date  . Anxiety   . Chronic headaches   . Thyroid disease   . History of lower GI bleeding   . PUD (peptic ulcer disease)     Past Surgical History  Procedure Date  . Knee surgery     x 2  . Band hemorrhoidectomy   . Wisdom tooth extraction     Social History Catherine [name]  reports that she has never smoked. She has never used smokeless tobacco. She reports that she does not drink alcohol or use illicit drugs.  family history includes Breast cancer in her maternal grandmother; Colon cancer in her maternal grandmother; Colon polyps in her maternal grandmother; Diabetes in her paternal grandfather; Lupus in her paternal grandmother; and Osteopenia in her mother.  Allergies  Allergen Reactions  . Nsaids        PHYSICAL EXAMINATION: Vital signs: BP 100/60  Pulse  88  Ht 5\' 6"  (1.676 m)  Wt 170 lb (77.111 kg)  BMI 27.44 kg/m2 General: Well-developed, well-nourished, no acute distress HEENT: Sclerae are anicteric, conjunctiva pink. Oral mucosa intact Lungs: Clear Heart: Regular Abdomen: soft, obese, nontender, nondistended, no obvious ascites, no peritoneal signs, normal bowel sounds. No organomegaly. Extremities: No edema Psychiatric: alert and oriented x3. Cooperative    ASSESSMENT:  #1. Problems of abdominal cramping and diarrhea. Most likely exacerbation of irritable bowel. #2. Anxiety   PLAN:  #1. Complete empiric trial of metronidazole #2. Try Bentyl for abdominal cramping #3. Close followup with her psychiatrist regarding anxiety and depression issues, as this may exacerbate irritable bowel. As such, improvement in her psychiatric issues may result in improvement of the abdominal complaints. #4. Followup on a pending stool studies #5. Provided letter for the patient's school regarding her current problems and need to withdraw #6. GI followup as needed. Return to the care of her primary provider.

## 2011-06-21 NOTE — Patient Instructions (Addendum)
We have given you a work note. Please finish your course of antibiotics. Take Imodium as needed for diarrhea. Continue Bentyl as prescribed.

## 2011-06-22 LAB — STOOL CULTURE

## 2011-06-25 ENCOUNTER — Telehealth: Payer: Self-pay | Admitting: Internal Medicine

## 2011-06-25 DIAGNOSIS — R197 Diarrhea, unspecified: Secondary | ICD-10-CM

## 2011-06-25 NOTE — Telephone Encounter (Signed)
paer office note on 06/21/11 patient was to have a stool for c-diff by PCR.  I have spoken with the lab and they did not provide the specimen container to the patient.  The Stool for O&P and culture are negative.  Patient is on flagyl 250 mg qid since 06/21/11.  Dr Marina Goodell please advise if you want her to come back and provide a specimen since she has been taking antibiotics.

## 2011-06-30 NOTE — Telephone Encounter (Signed)
Yes as she continued to complain of diarrhea at the recent o.v.

## 2011-06-30 NOTE — Telephone Encounter (Signed)
Patient advised to come and pick up container for stool for c-diff.  Mother verbalized understanding

## 2011-07-24 ENCOUNTER — Encounter (HOSPITAL_COMMUNITY): Payer: Self-pay

## 2011-07-24 ENCOUNTER — Emergency Department (HOSPITAL_COMMUNITY)
Admission: EM | Admit: 2011-07-24 | Discharge: 2011-07-24 | Disposition: A | Discharge status: Home / Self Care | Payer: 59 | Source: Home / Self Care

## 2011-07-24 DIAGNOSIS — N390 Urinary tract infection, site not specified: Secondary | ICD-10-CM

## 2011-07-24 HISTORY — DX: Irritable bowel syndrome, unspecified: K58.9

## 2011-07-24 LAB — POCT URINALYSIS DIP (DEVICE)
Glucose, UA: NEGATIVE mg/dL
Ketones, ur: NEGATIVE mg/dL
Protein, ur: NEGATIVE mg/dL
Urobilinogen, UA: 0.2 mg/dL (ref 0.0–1.0)

## 2011-07-24 LAB — POCT PREGNANCY, URINE: Preg Test, Ur: NEGATIVE

## 2011-07-24 MED ORDER — CEPHALEXIN 500 MG PO CAPS: 500.0000 mg | ORAL_CAPSULE | Freq: Four times a day (QID) | ORAL | Status: AC

## 2011-07-24 MED ORDER — PHENAZOPYRIDINE HCL 200 MG PO TABS: 200.0000 mg | ORAL_TABLET | Freq: Three times a day (TID) | ORAL | Status: AC

## 2011-07-24 NOTE — ED Provider Notes (Signed)
History     CSN: [id number] Arrival date & time: 07/24/2011  6:19 PM   None     Chief Complaint  Patient presents with  . Urinary Tract Infection    Pt has urinary frequency, blood in urine and low back pain since yesterday    (Consider location/radiation/quality/duration/timing/severity/associated sxs/prior treatment) HPI Comments: Pt had uti dx over thanksgiving, took 3 day antibiotics, felt better; 2 days later sx returned and have progessively worsened  Patient is a 20 y.o. female presenting with urinary tract infection. The history is provided by the patient.  Urinary Tract Infection This is a new problem. The current episode started more than 1 week ago. The problem occurs constantly. The problem has been gradually worsening. Pertinent negatives include no abdominal pain. Exacerbated by: urination. The symptoms are relieved by nothing. She has tried nothing for the symptoms.    Past Medical History  Diagnosis Date  . Anxiety   . Chronic headaches   . Thyroid disease   . History of lower GI bleeding   . PUD (peptic ulcer disease)   . Polycystic ovarian disease   . IBS (irritable bowel syndrome)     Past Surgical History  Procedure Date  . Knee surgery     x 2  . Band hemorrhoidectomy   . Wisdom tooth extraction     Family History  Problem Relation Age of Onset  . Breast cancer Maternal Grandmother   . Colon cancer Maternal Grandmother   . Colon polyps Maternal Grandmother   . Diabetes Paternal Grandfather   . Lupus Paternal Grandmother   . Osteopenia Mother     History  Substance Use Topics  . Smoking status: Never Smoker   . Smokeless tobacco: Never Used  . Alcohol Use: No    OB History    Grav Para Term Preterm Abortions TAB SAB Ect Mult Living                  Review of Systems  Constitutional: Negative for fever and chills.  Gastrointestinal: Negative for nausea, vomiting and abdominal pain.  Genitourinary: Positive for dysuria, urgency,  frequency, hematuria and flank pain. Negative for vaginal discharge.    Allergies  Nsaids and Penicillins  Home Medications   Current Outpatient Rx  Name Route Sig Dispense Refill  . BUSPIRONE HCL 10 MG PO TABS  One tablet by mouth twice daily     . CEPHALEXIN 500 MG PO CAPS Oral Take 1 capsule (500 mg total) by mouth 4 (four) times daily. 28 capsule 0  . CETIRIZINE HCL 10 MG PO TABS Oral Take 10 mg by mouth daily.      [name] DICYCLOMINE HCL 10 MG PO CAPS  Take 1 tablet by mouth every 4 hours as needed for pain. 120 capsule 0  . LOPERAMIDE HCL 2 MG PO TABS  Take as directed 1 tablet 0  . NORETHIN-ETH ESTRADIOL-FE 0.8-25 MG-MCG PO CHEW Oral Chew 1 tablet by mouth daily.      [name] PAROXETINE HCL 20 MG PO TABS Oral Take 20 mg by mouth every morning.      [name] PHENAZOPYRIDINE HCL 200 MG PO TABS Oral Take 1 tablet (200 mg total) by mouth 3 (three) times daily. 6 tablet 0  . ALIGN 4 MG PO CAPS Oral Take 1 capsule by mouth daily. 21 capsule 0    Take 1 capsule for 1 month.  . TOPIRAMATE 25 MG PO CPSP Oral Take 25 mg by mouth 3 (three) times  daily.        BP 109/76  Pulse 73  Temp(Src) 98.5 F (36.9 C) (Oral)  Resp 16  SpO2 97%  LMP 07/22/2011  Physical Exam  Constitutional: She appears well-developed and well-nourished. No distress.  Cardiovascular: Normal rate and regular rhythm.   Pulmonary/Chest: Effort normal and breath sounds normal.  Abdominal: Soft. Bowel sounds are normal. There is tenderness in the suprapubic area. There is CVA tenderness.       B CVAT    ED Course  Procedures (including critical care time)  Labs Reviewed  POCT URINALYSIS DIP (DEVICE) - Abnormal; Notable for the following:    Hgb urine dipstick MODERATE (*)    Leukocytes, UA MODERATE (*) Biochemical Testing Only. Please order routine urinalysis from main lab if confirmatory testing is needed.   All other components within normal limits  POCT PREGNANCY, URINE  POCT URINALYSIS DIPSTICK  POCT PREGNANCY, URINE    No results found.   1. UTI (lower urinary tract infection)    Pt not actually allergic to penicillins, but was advised by her GI not to take them.  Pt to f/u with pcp for recheck urine after completes antibiotics.    MDM          Cathlyn Parsons, NP 07/24/11 [phone]

## 2011-07-28 NOTE — ED Provider Notes (Signed)
Medical screening examination/treatment/procedure(s) were performed by non-physician practitioner and as supervising physician I was immediately available for consultation/collaboration.   Kiyo Heal [name] MD.    Azizah Lisle [name] Madalen Gavin, MD 07/28/11 2131 

## 2011-08-06 ENCOUNTER — Telehealth: Payer: Self-pay | Admitting: Internal Medicine

## 2011-08-06 NOTE — Telephone Encounter (Signed)
Left message for pt to call back  °

## 2011-08-09 NOTE — Telephone Encounter (Signed)
Spoke with pts mother and she needs a letter from our office stating that she can return to [employer] for the Spring 2013 semester. Letter mailed to pt.

## 2011-09-05 ENCOUNTER — Encounter: Payer: Self-pay | Admitting: "Endocrinology

## 2011-09-05 DIAGNOSIS — R5383 Other fatigue: Secondary | ICD-10-CM | POA: Insufficient documentation

## 2011-09-05 DIAGNOSIS — E049 Nontoxic goiter, unspecified: Secondary | ICD-10-CM | POA: Insufficient documentation

## 2011-09-05 DIAGNOSIS — E063 Autoimmune thyroiditis: Secondary | ICD-10-CM | POA: Insufficient documentation

## 2011-09-05 DIAGNOSIS — R1013 Epigastric pain: Secondary | ICD-10-CM | POA: Insufficient documentation

## 2011-09-05 DIAGNOSIS — N915 Oligomenorrhea, unspecified: Secondary | ICD-10-CM | POA: Insufficient documentation

## 2011-09-06 ENCOUNTER — Encounter: Payer: Self-pay | Admitting: "Endocrinology

## 2011-10-04 ENCOUNTER — Ambulatory Visit: Payer: Managed Care, Other (non HMO) | Admitting: "Endocrinology

## 2011-12-09 ENCOUNTER — Ambulatory Visit: Payer: 59 | Admitting: "Endocrinology

## 2012-02-01 ENCOUNTER — Other Ambulatory Visit: Payer: Self-pay | Admitting: *Deleted

## 2012-02-01 DIAGNOSIS — E038 Other specified hypothyroidism: Secondary | ICD-10-CM

## 2012-02-19 LAB — T3, FREE: T3, Free: 3 pg/mL (ref 2.3–4.2)

## 2012-02-19 LAB — TSH: TSH: 1.208 u[IU]/mL (ref 0.350–4.500)

## 2012-02-21 ENCOUNTER — Ambulatory Visit (INDEPENDENT_AMBULATORY_CARE_PROVIDER_SITE_OTHER): Payer: Managed Care, Other (non HMO) | Admitting: "Endocrinology

## 2012-02-21 ENCOUNTER — Encounter: Payer: Self-pay | Admitting: "Endocrinology

## 2012-02-21 VITALS — BP 103/69 | HR 72 | Wt 165.2 lb

## 2012-02-21 DIAGNOSIS — F32A Depression, unspecified: Secondary | ICD-10-CM

## 2012-02-21 DIAGNOSIS — K3189 Other diseases of stomach and duodenum: Secondary | ICD-10-CM

## 2012-02-21 DIAGNOSIS — R5383 Other fatigue: Secondary | ICD-10-CM

## 2012-02-21 DIAGNOSIS — F329 Major depressive disorder, single episode, unspecified: Secondary | ICD-10-CM

## 2012-02-21 DIAGNOSIS — R1013 Epigastric pain: Secondary | ICD-10-CM

## 2012-02-21 DIAGNOSIS — T07XXXA Unspecified multiple injuries, initial encounter: Secondary | ICD-10-CM

## 2012-02-21 DIAGNOSIS — E669 Obesity, unspecified: Secondary | ICD-10-CM

## 2012-02-21 DIAGNOSIS — J329 Chronic sinusitis, unspecified: Secondary | ICD-10-CM

## 2012-02-21 DIAGNOSIS — F341 Dysthymic disorder: Secondary | ICD-10-CM

## 2012-02-21 DIAGNOSIS — E063 Autoimmune thyroiditis: Secondary | ICD-10-CM

## 2012-02-21 DIAGNOSIS — E049 Nontoxic goiter, unspecified: Secondary | ICD-10-CM

## 2012-02-21 DIAGNOSIS — R5381 Other malaise: Secondary | ICD-10-CM

## 2012-02-21 NOTE — Progress Notes (Signed)
Subjective:  Patient Name: Catherine [name] Date of Birth: 1991-05-16  MRN: [id number]  Catherine [name]  presents to the office today for follow-up of her oligomenorrhea, PCOS, goiter, Hashimoto's thyroiditis, obesity, nausea and vomiting, dyspepsia, hypothyroidism, and fatigue.   HISTORY OF PRESENT ILLNESS:   Catherine [name] is a 21 y.o. Caucasian young woman.  Catherine [name] was accompanied by her mother.  1. The patient was first referred to me on 09/09/06 by her gynecologist, Dr. Marcelle Overlie, for evaluation and management of secondary amenorrhea. She was then 21 years old.  A. Patient had been essentially healthy up through age 50-13, when she underwent menarche. Her menstrual cycles have never been regular. Her last normal menstrual period occurred in about 2005. She was not on any oral contraceptives  or contraceptive implants at that time. Another obstetrician put her on oral contraceptives at that point. She gained about 20 pounds in weight in that first year on oral contraceptives. She started evaluation with Dr. Vincente Poli in 2007 or so. Dr. Vincente Poli put her on Yaz initially, but later converted her to [facility].The patient's past medical history was positive for a lot of depression, mood swings, and panic attacks. She also had 2 knee surgeries in the seventh grade. She did not do any type of routine physical activity. Family history was positive for diabetes in the paternal grandfather and hypothyroidism in the paternal grandmother.  Maternal grandmother had colon cancer. Mother was obese. Mother also had endometriosis.  B. On physical examination, her height was at the 35th percentile and her weight at 177.6 pounds was at the 93rd percentile. Her BMI was 30.7. She had a 25+ gram thyroid gland. She was tender on the right lobe. She also had 1+ acanthosis nigricans. Previous laboratory data from 06/21/06 showed a normal CMP. Her TSH was 2.05 and her free T4 was 1.18. A serum glucose was 91 with a corresponding insulin  level of 95. Laboratory data at our visit included a blood glucose of 79 and hemoglobin A1c of 5.0%. Subsequent lab tests on 09/12/06 showed a fasting glucose of 72 and a fasting insulin of 22. It appeared at that time that the patient was mildly obese. Her level of obesity was high enough, however, to cause a significant degree of insulin resistance and hyperinsulinemia. Her level of obesity may also have been high enough to cause oligomenorrhea. She had a goiter and clinical evidence of Hashimoto's disease, but she was euthyroid. I talked with the patient and her mother about our Eat Right Diet plan. I also talked about exercising 45-60 minutes a day. I started her on metformin, 500 mg, twice daily. 2. During the past 5-1/2 years, patient's weight has fluctuated up and down, varying between 150.7-179.4 lbs. She has continued to have flare ups of Hashimoto's disease. On 02/12/08 her TSH rose to 4.493, but then subsequently normalized. She has been essentially euthyroid for the entire time period. At times she's been on birth control patches, but most times has been on birth control pills. Her dyspepsia is better when she takes her Nexium and worse when she does not.  3. The patient's last [facility] visit was on 03/29/11. In the interim she saw Dr. Yancey Flemings, GI at [facility], for severe diarrhea. No specific diagnoses were made. He put her on antibiotics and then probiotics. She is doing better, but still has occasional diarrhea, especially when she is anxious. Because she was having almost constant panic attacks, Dr. Leigh Aurora added Klonopin to her Paxil. She is also taking Topamax  for menstrual migraines, but at a lower dose.  The migraines are better on the Topamax. She is also taking Generess Fe OCPs. She is very prone to contracting viral and bacterial illnesses. When she develops a bacterial illness, it usually take two or more courses of antibiotics to cure the bug. She has been sick so often that she has failed  several courses at [employer]. 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 When she had a three-week trip to [place] 8 weeks ago she exercised more and felt better.  She has been working out more, has gained some muscle mass, and has lost inches. She and her boyfriend frequently. On the other hand, she still has significant fatigue and many bad days.  4. As an aside as we were finishing up today, she was reviewing her history with her mother while I was typing this note. Both ladies mentioned a history of fractures, something I'd never heard before.  She reportedly has had multiple fractures in the past involving her fingers wrists, and long bones. Some of these fractures occurred after falls or trauma, but some seemed to happen after relatively little trauma.  Her last fracture was probably about 1-1/2 years ago.   5. Pertinent Review of Systems:  Constitutional: The patient feels "OK-good". She sleeps a lot, but also has her circadian rhythm off. Her energy level is low. She is less depressed in some ways, but is struggling with her frustrations about being chronically ill, about her shame at failing in school, and about worries concerning whether or not she will be able to support herself. . Eyes: Vision is good. There are no significant eye complaints. Neck: The patient has no complaints of anterior neck swelling, soreness, tenderness,  pressure, discomfort, or difficulty swallowing.  Heart: Heart rate increases with exercise or other physical activity. The patient has no complaints of palpitations, irregular heat beats, chest pain, or chest pressure. Gastrointestinal: She gets a "nervous stomach" under stress. Stress tends to cause diarrhea. She also has occasional rectal bleeding associated with chronic diarrhea. Bowel movents seem normal otherwise. The patient has no complaints of excessive hunger, acid reflux, upset stomach, stomach aches or pains, diarrhea, or constipation. Legs: Muscle mass  and strength seem normal. There are no complaints of numbness, tingling, burning, or pain. No edema is noted. Feet: There are no obvious foot problems. There are no complaints of numbness, tingling, burning, or pain. No edema is noted. GYN/GU: LMP 3 weeks ago on oral contraceptives.   PAST MEDICAL, FAMILY, AND SOCIAL HISTORY:  Past Medical History  Diagnosis Date  . Anxiety   . Chronic headaches   . Thyroid disease   . History of lower GI bleeding   . PUD (peptic ulcer disease)   . IBS (irritable bowel syndrome)   . PCOS (polycystic ovarian syndrome)   . Oligomenorrhea   . Goiter   . Hashimoto's thyroiditis   . Obesity   . Dyspepsia   . Hypothyroidism, acquired, autoimmune   . Fatigue     Family History  Problem Relation Age of Onset  . Breast cancer Maternal Grandmother   . Colon cancer Maternal Grandmother   . Colon polyps Maternal Grandmother   . Cancer Maternal Grandmother   . Diabetes Paternal Grandfather   . Lupus Paternal Grandmother   . Thyroid disease Paternal Grandmother     Hypothyroid without surgery or irradiation.  . Osteopenia Mother   . Obesity Mother     Current outpatient  prescriptions:clonazePAM (KLONOPIN) 0.5 MG tablet, Take 0.5 mg by mouth at bedtime as needed., Disp: , Rfl: ;  Norethindrone-Ethinyl Estradiol-Fe (GENERESS FE) 0.8-25 MG-MCG tablet, Chew 1 tablet by mouth daily.  , Disp: , Rfl: ;  PARoxetine (PAXIL) 20 MG tablet, Take 20 mg by mouth every morning.  , Disp: , Rfl: ;  topiramate (TOPAMAX) 25 MG capsule, Take 25 mg by mouth 2 (two) times daily. , Disp: , Rfl:  busPIRone (BUSPAR) 10 MG tablet, One tablet by mouth twice daily , Disp: , Rfl: ;  cetirizine (ZYRTEC) 10 MG tablet, Take 10 mg by mouth daily.  , Disp: , Rfl: ;  dicyclomine (BENTYL) 10 MG capsule, Take 1 tablet by mouth every 4 hours as needed for pain., Disp: 120 capsule, Rfl: 0;  loperamide (IMODIUM A-D) 2 MG tablet, Take as directed, Disp: 1 tablet, Rfl: 0 Probiotic Product (ALIGN) 4  MG CAPS, Take 1 capsule by mouth daily., Disp: 21 capsule, Rfl: 0  Allergies as of 02/21/2012 - Review Complete 02/21/2012  Allergen Reaction Noted  . Nsaids  03/29/2011  . Penicillins  07/24/2011    1. 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 She no longer qualifies for financial aid. 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 Her grandparents are ill and will move into her parents' home soon.  2. Activities: Walking with boyfriend 3. Smoking, alcohol, or drugs: None 4. Primary Care Provider: The patient no longer has a PCP. I've recommended Dr. Berniece Andreas.  ROS: There are no other significant problems involving Catherine [name]'s other body systems.   Objective:  Vital Signs:  BP 103/69  Pulse 72  Wt 165 lb 3.2 oz (74.934 kg)   Ht Readings from Last 3 Encounters:  06/21/11 5\' 6"  (1.676 m)  06/03/11 5\' 6"  (1.676 m)  05/11/11 5\' 6"  (1.676 m)   Wt Readings from Last 3 Encounters:  02/21/12 165 lb 3.2 oz (74.934 kg)  06/21/11 170 lb (77.111 kg)  06/03/11 169 lb (76.658 kg)   There is no height on file to calculate BSA.  Normalized stature-for-age data available only for age 11 to 20 years. Normalized weight-for-age data available only for age 11 to 20 years.   PHYSICAL EXAM:  Constitutional: The patient appears somewhat sad, but fairly normal. She has lost 5 pounds since her last visit. Face: The face appears normal.  Eyes: There is no obvious arcus or proptosis. Moisture appears normal. Mouth: The oropharynx and tongue appear normal. Dentition appears to be normal for age. Oral moisture is normal. She has no hyperpigmentation of the oral mucosa. Neck: The neck appears to be visibly normal. No carotid bruits are noted. The thyroid gland is 20+ grams in size. Her thyroid isthmus is fairly large. The consistency of the thyroid gland is normal. The thyroid gland is not tender to palpation. Lungs: The lungs are clear  to auscultation. Air movement is good. Heart: Heart rate and rhythm are regular. Heart sounds S1 and S2 are normal. I did not appreciate any pathologic cardiac murmurs. Abdomen: The abdomen is somewhat enlarged. Bowel sounds are normal. There is no obvious hepatomegaly, splenomegaly, or other mass effect.  Arms: Muscle size and bulk are normal for age. Hands: There is no obvious tremor. Phalangeal and metacarpophalangeal joints are normal. Palmar muscles are normal. Palmar skin is normal. Palmar moisture is also normal. Nails are normally pink. There is no palmar hyperpigmentation.  Legs: Muscles appear  normal for age. No edema is present. Neurologic: Strength is normal for age in both the upper and lower extremities. Muscle tone is normal. Sensation to touch is normal in both legs.   Skin: She is fairly pale.   LAB DATA: 02/18/12: TSH 1.208, free T4, free T3 3.0   Assessment and Plan:   ASSESSMENT:  1. Thyroiditis: The patient's Hashimoto's disease is clinically quiescent. She was mid-range euthyroid last week. . 2. Goiter: The thyroid gland is about the same size today compared with last visit. 3. Obesity: Her weight is better. 4.  Dyspepsia: She is doing better. 5. Fatigue: She is doing better, but this is still a problem. Clinically she does not show the hyperpigmentation and loss of appetite that would be expected with evolving Addison's disease. She does appear somewhat pale, so she could have anemia, iron deficiency anemia, or lowish iron.  6. Anxiety and depression: She is doing better than she was two years ago, but not as well as she was one year ago.  7. Chronic/recurrent/frequent infectious/inflammatory disease: She has had a lot of difficulty in the past year with recurrent and difficult to treat infections. Mother wonders if she has some immunological compromise, such as chronic fatigue syndrome.  I doubt that her clinical course is due to Addison's disease. 8. Multiple fractures:  She brought this up at the end of the visit. It's really hard to tell from her history whether or not she has a genuine problem with her bone mineral metabolism.   PLAN:  1. Diagnostic: The following blood tests will be drawn at 8:00 AM: CMP, CBC with diff, iron, ACTH, and cortisol, 25-OH vitamin D, PTH, calcium,1.25 Vitamin D 2. Therapeutic: Take one good MVI per day, either Centrum for Women or One-A-Day for Women. I recommended that she contact Dr. Berniece Andreas for primary care. 3. Patient education: We talked a lot about causes of chronic fatigue. She may have had an extended mono-like illness. If she continues to have recurrent infections that are difficult to treat and that severely hamper her lifestyle, consultation to infectious disease and/or rheumatology may be indicated.   4. Follow-up: 4 months  Level of Service: This visit lasted in excess of 40 minutes. More than 50% of the visit was devoted to counseling.  David Stall, MD 02/21/2012 2:27 PM

## 2012-02-21 NOTE — Patient Instructions (Addendum)
Follow-up visit in 4 months. Take either one-A-Day for Women or Centrum for Women

## 2012-03-02 LAB — CBC WITH DIFFERENTIAL/PLATELET
Basophils Absolute: 0 10*3/uL (ref 0.0–0.1)
Basophils Relative: 1 % (ref 0–1)
Eosinophils Relative: 2 % (ref 0–5)
HCT: 45.4 % (ref 36.0–46.0)
MCHC: 33.3 g/dL (ref 30.0–36.0)
MCV: 90.3 fL (ref 78.0–100.0)
Monocytes Absolute: 0.4 10*3/uL (ref 0.1–1.0)
RDW: 13.2 % (ref 11.5–15.5)

## 2012-03-02 LAB — COMPREHENSIVE METABOLIC PANEL
AST: 13 U/L (ref 0–37)
Alkaline Phosphatase: 78 U/L (ref 39–117)
BUN: 11 mg/dL (ref 6–23)
Calcium: 9.2 mg/dL (ref 8.4–10.5)
Creat: 0.83 mg/dL (ref 0.50–1.10)
Total Bilirubin: 0.2 mg/dL — ABNORMAL LOW (ref 0.3–1.2)

## 2012-03-02 LAB — CORTISOL: Cortisol, Plasma: 10.4 ug/dL

## 2012-03-03 LAB — VITAMIN D 25 HYDROXY (VIT D DEFICIENCY, FRACTURES): Vit D, 25-Hydroxy: 29 ng/mL — ABNORMAL LOW (ref 30–89)

## 2012-03-03 LAB — PTH, INTACT AND CALCIUM: PTH: 34.3 pg/mL (ref 14.0–72.0)

## 2012-03-03 LAB — ACTH: C206 ACTH: 11 pg/mL (ref 10–46)

## 2012-03-06 LAB — VITAMIN D 1,25 DIHYDROXY: Vitamin D 1, 25 (OH)2 Total: 72 pg/mL (ref 18–72)

## 2012-03-08 ENCOUNTER — Other Ambulatory Visit: Payer: Self-pay | Admitting: *Deleted

## 2012-03-08 DIAGNOSIS — E069 Thyroiditis, unspecified: Secondary | ICD-10-CM

## 2012-05-31 ENCOUNTER — Ambulatory Visit (INDEPENDENT_AMBULATORY_CARE_PROVIDER_SITE_OTHER): Payer: 59 | Admitting: Internal Medicine

## 2012-05-31 ENCOUNTER — Encounter: Payer: Self-pay | Admitting: Internal Medicine

## 2012-05-31 VITALS — BP 112/72 | HR 91 | Temp 98.2°F | Ht 64.75 in | Wt 168.0 lb

## 2012-05-31 DIAGNOSIS — K589 Irritable bowel syndrome without diarrhea: Secondary | ICD-10-CM

## 2012-05-31 DIAGNOSIS — F4322 Adjustment disorder with anxiety: Secondary | ICD-10-CM

## 2012-05-31 DIAGNOSIS — N915 Oligomenorrhea, unspecified: Secondary | ICD-10-CM

## 2012-05-31 DIAGNOSIS — Z87440 Personal history of urinary (tract) infections: Secondary | ICD-10-CM

## 2012-05-31 DIAGNOSIS — G43829 Menstrual migraine, not intractable, without status migrainosus: Secondary | ICD-10-CM

## 2012-05-31 DIAGNOSIS — E063 Autoimmune thyroiditis: Secondary | ICD-10-CM

## 2012-05-31 DIAGNOSIS — E559 Vitamin D deficiency, unspecified: Secondary | ICD-10-CM

## 2012-05-31 DIAGNOSIS — Z8781 Personal history of (healed) traumatic fracture: Secondary | ICD-10-CM

## 2012-05-31 NOTE — Progress Notes (Signed)
Subjective:    Patient ID: Catherine [name], female    DOB: [birth year], 21 y.o.   MRN: [id number]  HPI  Patient comes in as new patient visit . Previous care has been a number of different specialists over the last years. She has not had a primary care physician for while but [facility]. She has had a number of problems and refers to herself as physically a lot. She has missed a good deal of school and had to drop out of [employer] because she missed too many classes. Has been difficult for her because her financial aid has been rescinded in the meantime. 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  Her medical problems include Hashimoto's thyroiditis autoimmune disease followed by Dr. Fransico Michael Menstrual migraines seen by Dr. Vincente Poli who put her on OCPs and also Dr. Hulda Humphrey who has her on Topamax.  History of ovarian cyst possible PCO S. Anxiety and panic sees a counselor and a psychiatrist in [place] on Paxil and as needed low-dose Klonopin. They are in the midst of changing psychiatrist ;appointment with Dr. Evelene Croon. History of recurrent UTIs requiring antibiotics but caused GI upset..  she has seen Dr. Vernie Ammons She has had recurrent GI distress seen by Dr. Marina Goodell diagnosed with irritable bowel syndrome apparently has had a colonoscopy and screening test for celiac know upper endoscopy. She's been diagnosed with a low vitamin D level by her OB/GYN and was put her on prescription strength vitamin D There is some question per mom and patient about her low immunity and getting recurrent bacterial infections. However up until adolescence she was very well had a normal birth history no hospitalizations or serious illnesses or disease.   She also when she was in young teenager had surgery at [facility] a history of an unusual fracture of femur near her knee. There is no history otherwise of recurrent fracture or family  history of such. 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  nReview of Systems  Currently negative for chest pain shortness of breath breathing wheezing syncope current abdominal pain or diarrhea blood in her stool acute joint swelling unusual rashes. She is somewhat down at times and somewhat anxious about her situation but is able to speak with her parents who have been very supportive. Recent cessation of a long-term 3 year relationship.   Past history family history social history reviewed in the electronic medical record.  Outpatient Encounter Prescriptions as of 05/31/2012  Medication Sig Dispense Refill  . clonazePAM (KLONOPIN) 0.5 MG tablet Take 0.25 mg by mouth at bedtime as needed.       . ergocalciferol (VITAMIN D2) 50000 UNITS capsule Take 50,000 Units by mouth once a week. For five weeks      . loperamide (IMODIUM A-D) 2 MG tablet Take as directed  1 tablet  0  . Norethindrone-Ethinyl Estradiol-Fe (GENERESS FE) 0.8-25 MG-MCG tablet Chew 1 tablet by mouth daily.        [name] PARoxetine (PAXIL) 20 MG tablet Take 20 mg by mouth every morning.        . topiramate (TOPAMAX) 25 MG capsule Take 25 mg by mouth 2 (two) times daily.       . cetirizine (ZYRTEC) 10 MG tablet Take 10 mg by mouth daily.        [name] dicyclomine (BENTYL) 10 MG capsule Take 1 tablet by mouth every 4 hours as needed for  pain.  120 capsule  0  . DISCONTD: busPIRone (BUSPAR) 10 MG tablet One tablet by mouth twice daily       . DISCONTD: Probiotic Product (ALIGN) 4 MG CAPS Take 1 capsule by mouth daily.  21 capsule  0       Objective:   Physical Exam BP 112/72  Pulse 91  Temp 98.2 F (36.8 C) (Oral)  Ht 5' 4.75" (1.645 m)  Wt 168 lb (76.204 kg)  BMI 28.17 kg/m2  SpO2 98%  LMP 05/22/2012 [facility] in nad  heent grossly normal. Neck palpable thyroid ;no tenderness or bruit. Chest:  Clear to A&P without wheezes rales or rhonchi CV:  S1-S2 no gallops or murmurs peripheral perfusion is normal  abdomen soft without  organomegaly guarding or rebound skin no unusual story pierced nostril   negative CCE affect good eye contact normal thought processes and attention quite verbal no tremor no obvious depression screen not done.     Assessment & Plan:   Hashimotos thyroiditis Menstrual migrainess  dohmeir q 6 month check Dr Vincente Poli  ocps and vit d deficiency.  Hx ovarian cysts Frequent fractures. ? Doubt underlying metabolic bone disease but to see a Rheum? Soon  do not have the records to review to give further opinion. GI:   Dr Marina Goodell   Ibs.    ?  Sx  Diarrhea  And  Some vomiting in the past gi bleeding.    No celiac by serology and bx in past  Aggravated  by antibiotics in the past. Anxiety  :   .  Hx panic attacks  clonipen prn.  and paxil.    Hp doctor  Changing  To Dr  Evelene Croon. November.  "Chronic UTIs" has been on antibiotic per Dr Enid Baas.   Late summer   No recent antibiotic Possible allergy taking antihistamine. Preventive care discussed having a flu immunization this season she will think about it and come back strongly recommended in her situation risk is way low compared benefit. She can call for this.  At this time I have encouraged her to view her unlucky predicament in a more positive light to help her with anxiety in going forward that she" she is well in between her illnesses". Encouraged her to not view or refer to herself  as a chronically sickly person. She has family supports. Explained that we can try to help out with her initial evaluation of acute illnesses and help coordinate her care. With her specialists.

## 2012-05-31 NOTE — Patient Instructions (Signed)
Will review your record . Contact us as needed for illness  And then wellness visit in 4- 6 months .  Have your specialists send [place] notes about the visits,  Advise flu vaccine  For prevention.

## 2012-07-05 LAB — ACTH: C206 ACTH: 24 pg/mL (ref 10–46)

## 2012-07-10 ENCOUNTER — Ambulatory Visit (INDEPENDENT_AMBULATORY_CARE_PROVIDER_SITE_OTHER): Payer: 59 | Admitting: "Endocrinology

## 2012-07-10 ENCOUNTER — Encounter: Payer: Self-pay | Admitting: "Endocrinology

## 2012-07-10 VITALS — BP 120/90 | HR 80 | Temp 97.0°F | Wt 167.4 lb

## 2012-07-10 DIAGNOSIS — E063 Autoimmune thyroiditis: Secondary | ICD-10-CM

## 2012-07-10 DIAGNOSIS — R5381 Other malaise: Secondary | ICD-10-CM

## 2012-07-10 DIAGNOSIS — E559 Vitamin D deficiency, unspecified: Secondary | ICD-10-CM

## 2012-07-10 DIAGNOSIS — F341 Dysthymic disorder: Secondary | ICD-10-CM

## 2012-07-10 DIAGNOSIS — F419 Anxiety disorder, unspecified: Secondary | ICD-10-CM

## 2012-07-10 DIAGNOSIS — E049 Nontoxic goiter, unspecified: Secondary | ICD-10-CM

## 2012-07-10 DIAGNOSIS — E663 Overweight: Secondary | ICD-10-CM

## 2012-07-10 DIAGNOSIS — F32A Depression, unspecified: Secondary | ICD-10-CM

## 2012-07-10 DIAGNOSIS — T07XXXA Unspecified multiple injuries, initial encounter: Secondary | ICD-10-CM

## 2012-07-10 DIAGNOSIS — R5383 Other fatigue: Secondary | ICD-10-CM

## 2012-07-10 NOTE — Patient Instructions (Addendum)
Follow up visit in 4 months. Call [facility] radiology service to schedule your bone density study.

## 2012-07-10 NOTE — Progress Notes (Signed)
Subjective:  Patient Name: Catherine [name] Date of Birth: 1991-04-25  MRN: [id number]  Catherine [name]  presents to the office today for follow-up of her oligomenorrhea, PCOS, goiter, Hashimoto's thyroiditis, obesity, nausea and vomiting, dyspepsia, hypothyroidism, and fatigue.   HISTORY OF PRESENT ILLNESS:   Catherine [name] is a 21 y.o. Caucasian young woman.  Catherine [name] was accompanied by her mother.  1. The patient was first referred to me on 09/09/06 by her gynecologist, Dr. Marcelle Overlie, for evaluation and management of secondary amenorrhea. She was then 21 years old.  A. Patient had been essentially healthy up through age 68-13, when she underwent menarche. Her menstrual cycles have never been regular. Her last normal menstrual period occurred in about 2005. She was not on any oral contraceptives  or contraceptive implants at that time. Another obstetrician put her on oral contraceptives at that point. She gained about 20 pounds in weight in that first year on oral contraceptives. She started evaluation with Dr. Vincente Poli in 2007 or so. Dr. Vincente Poli put her on Yaz initially, but later converted her to [employer].The patient's past medical history was positive for a lot of depression, mood swings, and panic attacks. She also had 2 knee surgeries in the seventh grade. She did not do any type of routine physical activity. Family history was positive for diabetes in the paternal grandfather and hypothyroidism in the paternal grandmother.  Father had severe depression and anxiety. Maternal grandmother had colon cancer. Mother was obese. Mother also had endometriosis. As I later learned on 07/10/12, one of Catherine [name]'s mother's first cousins had recently developed Addison's Disease.    B. On physical examination, her height was at the 35th percentile and her weight at 177.6 pounds was at the 93rd percentile. Her BMI was 30.7. She had a 25+ gram thyroid gland. She was tender on the right lobe. She also had 1+ acanthosis  nigricans. Previous laboratory data from 06/21/06 showed a normal CMP. Her TSH was 2.05 and her free T4 was 1.18. A serum glucose was 91 with a corresponding insulin level of 95. Laboratory data at our visit included a blood glucose of 79 and hemoglobin A1c of 5.0%. Subsequent lab tests on 09/12/06 showed a fasting glucose of 72 and a fasting insulin of 22. It appeared at that time that the patient was mildly obese. Her level of obesity was high enough, however, to cause a significant degree of insulin resistance and hyperinsulinemia. Her level of obesity may also have been high enough to cause oligomenorrhea. She had a goiter and clinical evidence of Hashimoto's disease, but she was euthyroid. I talked with the patient and her mother about our Eat Right Diet plan. I also talked about exercising 45-60 minutes a day. I started her on metformin, 500 mg, twice daily. 2. During the past 5-1/2 years, patient's weight has fluctuated up and down, varying between 150.7-179.4 lbs. She has continued to have flare ups of Hashimoto's disease. On 02/12/08 her TSH rose to 4.493, but then subsequently normalized. She has been euthyroid for most of the past 5 years. At times she's been on birth control patches, but most times has been on birth control pills. Her dyspepsia is better when she takes her Nexium and worse when she does not.  3. The patient's last [facility] visit was on 02/21/12. In the interim she had been healthy, until she developed a new URI several days ago. She has been undergoing a lot of emotional stresses. She recently had a change of anti-depressants from Paxil to  Pristiq and a change in benzodiazepines from Klonopin to Xanax. She continues to have problems with frequent diarrhea. She is also taking Topamax for menstrual migraines.  The migraines are much less frequent and less severe on the Topamax. She is also taking Gene[employer] Fe OCPs.  She sometimes takes her multivitamin. She has not been working out lately. She  has not had any more fractures.  4. Pertinent Review of Systems:  Constitutional: The patient feels "OK, but my emotional state really drags me down." She sleeps a lot, but also stays up late a lot and has an abnormal circadian rhythm. Her energy level is low. She is  more depressed than she was at last visit.  Eyes: Vision is good. There are no significant eye complaints. Neck: The patient has no complaints of anterior neck swelling, soreness, tenderness,  pressure, discomfort, or difficulty swallowing.  Heart: Heart rate increases with exercise or other physical activity. The patient has no complaints of palpitations, irregular heat beats, chest pain, or chest pressure. Gastrointestinal: She gets a "nervous stomach" under stress. Stress tends to cause diarrhea. She also has occasional rectal bleeding associated with chronic diarrhea. Bowel movents seem normal otherwise. The patient has no complaints of excessive hunger, acid reflux, upset stomach, stomach aches or pains, diarrhea, or constipation. Legs: Muscle mass and strength seem normal. There are no complaints of numbness, tingling, burning, or pain. No edema is noted. Feet: There are no obvious foot problems. There are no complaints of numbness, tingling, burning, or pain. No edema is noted. GYN: LMP 3 weeks ago on oral contraceptives.   PAST MEDICAL, FAMILY, AND SOCIAL HISTORY:  Past Medical History  Diagnosis Date  . Anxiety     Counseling and psychiatry  . Chronic headaches     Sees specialist  . Thyroid disease   . History of lower GI bleeding   . PUD (peptic ulcer disease)   . IBS (irritable bowel syndrome)   . PCOS (polycystic ovarian syndrome)   . Oligomenorrhea   . Goiter   . Hashimoto's thyroiditis   . Obesity   . Dyspepsia   . Hypothyroidism, acquired, autoimmune   . Fatigue   . History of shingles     face right   . History of recurrent UTIs     Sees urologist  . Hx of varicella     Family History  Problem  Relation Age of Onset  . Breast cancer Maternal Grandmother   . Colon cancer Maternal Grandmother   . Colon polyps Maternal Grandmother   . Cancer Maternal Grandmother   . Diabetes Paternal Grandfather   . Lupus Paternal Grandmother   . Thyroid disease Paternal Grandmother     Hypothyroid without surgery or irradiation.  . Osteopenia Mother   . Obesity Mother     Current outpatient prescriptions:ALPRAZolam (XANAX XR) 0.5 MG 24 hr tablet, Take by mouth every morning., Disp: , Rfl: ;  cetirizine (ZYRTEC) 10 MG tablet, Take 10 mg by mouth daily.  , Disp: , Rfl: ;  ergocalciferol (VITAMIN D2) 50000 UNITS capsule, Take 50,000 Units by mouth once a week. For five weeks, Disp: , Rfl: ;  loperamide (IMODIUM A-D) 2 MG tablet, Take as directed, Disp: 1 tablet, Rfl: 0 clonazePAM (KLONOPIN) 0.5 MG tablet, Take 0.25 mg by mouth at bedtime as needed. , Disp: , Rfl: ;  dicyclomine (BENTYL) 10 MG capsule, Take 1 tablet by mouth every 4 hours as needed for pain., Disp: 120 capsule, Rfl: 0;  Norethindrone-Ethinyl Estradiol-Fe (  GENERESS FE) 0.8-25 MG-MCG tablet, Chew 1 tablet by mouth daily.  , Disp: , Rfl: ;  PARoxetine (PAXIL) 20 MG tablet, Take 20 mg by mouth every morning.  , Disp: , Rfl:  topiramate (TOPAMAX) 25 MG capsule, Take 25 mg by mouth 2 (two) times daily. , Disp: , Rfl:   Allergies as of 07/10/2012 - Review Complete 05/31/2012  Allergen Reaction Noted  . Nsaids  03/29/2011  . Penicillins  07/24/2011    1. 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 Her grandparents are ill and moved into her parents' home soon. Alaisia and her boyfriend had been living together, but they recently broke up. She is still living in the apartment that they shared.   2. Activities: Not much 3. Smoking, alcohol, or drugs: None 4. Primary Care Provider: Both Salene and her mother really liked Dr. Berniece Andreas. 5.  Psychiatrist: Dr. Milagros Evener, MD - phone [phone], fax [phone]  REVIEW OF SYSTEMS: There are no other significant problems involving Sruthi's other body systems.   Objective:  Vital Signs:  BP 120/90  Pulse 80  Temp 97 F (36.1 C)  Wt 167 lb 6.4 oz (75.932 kg)  She is on Sudafed today for her URI.    Ht Readings from Last 3 Encounters:  05/31/12 5' 4.75" (1.645 m)  06/21/11 5\' 6"  (1.676 m)  06/03/11 5\' 6"  (1.676 m)   Wt Readings from Last 3 Encounters:  07/10/12 167 lb 6.4 oz (75.932 kg)  05/31/12 168 lb (76.204 kg)  02/21/12 165 lb 3.2 oz (74.934 kg)   There is no height on file to calculate BSA.  Normalized stature-for-age data available only for age 82 to 20 years. Normalized weight-for-age data available only for age 82 to 20 years.  PHYSICAL EXAM:  Constitutional: The patient appears somewhat sad, but fairly normal. She has gained one pound since her last visit. She is sniffling and coughing intermittently. Face: The face appears normal.  Eyes: There is no obvious arcus or proptosis. Moisture appears normal. Mouth: The oropharynx and tongue appear normal. Dentition appears to be normal for age. Oral moisture is normal. She has no hyperpigmentation of the oral mucosa. Neck: The neck appears to be visibly normal. No carotid bruits are noted. The thyroid gland is 20+ grams in size. Her thyroid isthmus is fairly large. The consistency of the thyroid gland is normal. The thyroid gland is mildly tender to palpation in the right mid-lobe area. Lungs: The lungs are clear to auscultation. Air movement is good. Heart: Heart rate and rhythm are regular. Heart sounds S1 and S2 are normal. I did not appreciate any pathologic cardiac murmurs. Abdomen: The abdomen is somewhat enlarged. Bowel sounds are normal. There is no obvious hepatomegaly, splenomegaly, or other mass effect.  Arms: Muscle size and bulk are normal for age. Hands: There is no obvious tremor. Phalangeal and  metacarpophalangeal joints are normal. Palmar muscles are normal. Palmar skin is normal. Palmar moisture is also normal. Nails are normally pink. There is no palmar hyperpigmentation.  Legs: Muscles appear normal for age. No edema is present. Neurologic: Strength is normal for age in both the upper and lower extremities. Muscle tone is normal. Sensation to touch is normal in both legs.   Skin: She is fairly pale.   LAB DATA:  07/04/12 at 9 AM: ACTH 24, cortisol 17.3 02/18/12: TSH 1.208, free T4, free  T3 3.0   Assessment and Plan:   ASSESSMENT:  1. Thyroiditis: The patient's Hashimoto's disease is active today. She was mid-range euthyroid in July. 2. Goiter: The thyroid gland is about the same size today as compared with last visit. 3. Obesity: Her weight is about the same.  4.  Dyspepsia: She is doing better. 5. Fatigue: She is doing better, but this is still a problem. Her hypothalamic-pituitary-adreal axis is intact. Her hypothalamic-pituitary-thyroid axis is also intact. It appears that much of her fatigue is due to depression and perhaps due to the combination of medications she is on. Her CBC in July was perfectly normal.  Her iron in July was within normal limits according to the lab, but below the limit of 70 which is what many hematologists see as the lower limit of normal. She should take a good multivitamin for women that has adequate iron.  6. Anxiety and depression: She is probably doing worse than she was at last visit. Both Dariona and mom hope that her new medications will work for her. 7. Chronic/recurrent/frequent infectious/inflammatory disease: She has been free of these problems for the past several months.  8. Multiple fractures: She brought this up at the end of her last visit. Her PTH in July was mid-range normal. Her calcium was at about the 35%. Her 25-OH vitamin D was a bit low. Her 1,25-dihydroxy vitamin D was at the upper limit of normal. She should consume more calcium  and vitamin D in her diet or take a good multivitamin for women.  Dr. Vincente Poli has suggested that Jeanae have a BMD to assess her bone mineral status. I concur.      PLAN:  1. Diagnostic: I ordered a BMD study at [facility] to determine if her multiple fractures were caused by a low BMD. The following blood tests will be drawn prior to next visit: TFTs, iron, 25-OH vitamin D, PTH, calcium,1,25-dihydroxy Vitamin D. 2. Therapeutic: Take one good MVI per day, either Centrum for Women or One-A-Day for Women. 3. Patient education: We talked a lot again about causes of chronic fatigue. She may have had an extended mono-like illness in the past. If she continues to have recurrent infections that are difficult to treat and that severely hamper her lifestyle, consultation to infectious disease and/or rheumatology may be indicated.   4. Follow-up: 4 months  Level of Service: This visit lasted in excess of 40 minutes. More than 50% of the visit was devoted to counseling.  David Stall, MD 07/10/2012 10:21 AM

## 2012-08-22 ENCOUNTER — Telehealth: Payer: Self-pay | Admitting: Internal Medicine

## 2012-08-22 NOTE — Telephone Encounter (Signed)
This pt's grandparents have moved here from [place] and they want to know if you would accept them as patients. They both have Medicare.

## 2012-08-28 NOTE — Telephone Encounter (Signed)
Pt's grandparents have called back and they have medicare w/ [employer]. They have just moved from [place], [place] and want to be patients of yours. Pls advise New phone no; [phone] CELL: [phone]

## 2012-09-01 ENCOUNTER — Telehealth: Payer: Self-pay | Admitting: Internal Medicine

## 2012-09-01 ENCOUNTER — Ambulatory Visit (INDEPENDENT_AMBULATORY_CARE_PROVIDER_SITE_OTHER): Payer: 59 | Admitting: Internal Medicine

## 2012-09-01 ENCOUNTER — Encounter: Payer: Self-pay | Admitting: Internal Medicine

## 2012-09-01 VITALS — BP 120/70 | HR 79 | Temp 98.2°F | Wt 169.0 lb

## 2012-09-01 DIAGNOSIS — L309 Dermatitis, unspecified: Secondary | ICD-10-CM

## 2012-09-01 DIAGNOSIS — K589 Irritable bowel syndrome without diarrhea: Secondary | ICD-10-CM

## 2012-09-01 DIAGNOSIS — K625 Hemorrhage of anus and rectum: Secondary | ICD-10-CM

## 2012-09-01 DIAGNOSIS — R197 Diarrhea, unspecified: Secondary | ICD-10-CM

## 2012-09-01 DIAGNOSIS — L259 Unspecified contact dermatitis, unspecified cause: Secondary | ICD-10-CM

## 2012-09-01 MED ORDER — HYDROCORTISONE ACETATE 25 MG RE SUPP: 25.0000 mg | Freq: Every day | RECTAL | Status: DC

## 2012-09-01 NOTE — Patient Instructions (Addendum)
Steroid  On the face can cause a different rash  Dermatitis.  Suggest  May need to withdraw from steroid.   Try again  Lactose  Free diet for another 2 weeks and do  Probiotic  andsee if calms down  Will look at record and get you original back.   To you  Ok to use supp in short term.   Medicines are just sx  Control and not  A cure.   Some people have had success on the FODMAPS diet   Look on web md or other   There is no specific medicine affixes every symptom so treatment and management is very individualized. Can use suppositories as needed but not on a chronic basis and no more than 10 days at a time

## 2012-09-01 NOTE — Telephone Encounter (Signed)
At this time  dont have the capacity to take on new medicare patients  .   Can ask if  Dr Selena Batten  Is  Able to do this .

## 2012-09-01 NOTE — Progress Notes (Signed)
Chief Complaint  Patient presents with  . Diarrhea    Has constant diarrhea.  This is not a new problem.  She has internal hemorrhoids and has been using a rectal suppository supplied by her mom.  She would like a new rx for these as the old ones are outdated.  . Abdominal Pain  . Rectal Bleeding    HPI: Patient comes in today for SDA for  problem evaluation. Hx of above comes in today from flare.  Frequently since  About 22 years of age.  2 colonsocopy and 1 endoscopy showed inflammation but no specific diagnosis no celiac. She tends to get rectal bleeding that trips into the toilet bowl from either fissure or hemorrhoid when her her bowel problem flares up using expired  suppositorites   . These do work to calm things down used intermittently Hemorrhoidal hc 25 as needed hx  10 08  She is seeing a number of gastric antrum neurologist in town according to her with no specific diagnosis although possibly IBS was given Bentyl for symptoms and only supposed to check as needed. Has some concern about what diagnosis she has. Description of the problem Period type cramps.  And then  Hot fiery liquid   Very very loose . Hx fo brb drip .   No vomiting  Some at night.  Stress and   Antibiotic can make this flare she has had some improvement with  probiotic and flagyl  At times.  Not a trigger recently.  And that's why she came in today with increasing stooling but no fever syncope other bleeding vomiting.  She brings in a packet of old records from [facility] [place] to review and return back to her. ROS: See pertinent positives and negatives per HPI. Facial rash  mostly on for heads feel like deep under the skin and callus ;l has used a number of lotions for the itching.   Has helped to use a steroidal  cream for psoriasis  And eczema. This seems to continue initially she had it in the nasolabial area now mostly on the for head. Past Medical History  Diagnosis Date  . Anxiety     Counseling and  psychiatry  . Chronic headaches     Sees specialist  . Thyroid disease   . History of lower GI bleeding   . PUD (peptic ulcer disease)   . IBS (irritable bowel syndrome)   . PCOS (polycystic ovarian syndrome)   . Oligomenorrhea   . Goiter   . Hashimoto's thyroiditis   . Obesity   . Dyspepsia   . Hypothyroidism, acquired, autoimmune   . Fatigue   . History of shingles     face right   . History of recurrent UTIs     Sees urologist  . Hx of varicella     Family History  Problem Relation Age of Onset  . Breast cancer Maternal Grandmother   . Colon cancer Maternal Grandmother   . Colon polyps Maternal Grandmother   . Cancer Maternal Grandmother   . Diabetes Paternal Grandfather   . Lupus Paternal Grandmother   . Thyroid disease Paternal Grandmother     Hypothyroid without surgery or irradiation.  . Osteopenia Mother   . Obesity Mother     History   Social History  . Marital Status: Single    Spouse Name: N/A    Number of Children: 0  . 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 Smoking status: Never Smoker   . Smokeless tobacco: Never Used  . Alcohol Use: No  . Drug Use: No  . Sexually Active: Yes    Birth Control/ Protection: Pill   Other Topics Concern  . None   Social History Narrative   Lives by herself with 5 cats near [facility] apartment. Household to 3 at home 10 hours of sleepNegative alcohol tobacco some caffeine occasionallyFifth year senior [employer]. 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 Will be difficult to go back financially.    Outpatient Encounter Prescriptions as of 09/01/2012  Medication Sig Dispense Refill  . ALPRAZolam (XANAX XR) 0.5 MG 24 hr tablet Take by mouth every morning.      . cetirizine (ZYRTEC) 10 MG tablet Take 10 mg by mouth daily.        [name] desvenlafaxine (PRISTIQ) 50 MG 24 hr tablet Take 50 mg by mouth daily.      [name] dicyclomine  (BENTYL) 10 MG capsule Take 1 tablet by mouth every 4 hours as needed for pain.  120 capsule  0  . loperamide (IMODIUM A-D) 2 MG tablet Take as directed  1 tablet  0  . Norethindrone-Ethinyl Estradiol-Fe (GENERESS FE) 0.8-25 MG-MCG tablet Chew 1 tablet by mouth daily.        [name] topiramate (TOPAMAX) 25 MG capsule Take 25 mg by mouth 2 (two) times daily.       . hydrocortisone (ANUSOL-HC) 25 MG suppository Place 1 suppository (25 mg total) rectally at bedtime. When flaring  12 suppository  1  . [DISCONTINUED] clonazePAM (KLONOPIN) 0.5 MG tablet Take 0.25 mg by mouth at bedtime as needed.       . [DISCONTINUED] ergocalciferol (VITAMIN D2) 50000 UNITS capsule Take 50,000 Units by mouth once a week. For five weeks      . [DISCONTINUED] PARoxetine (PAXIL) 20 MG tablet Take 20 mg by mouth every morning.          EXAM:  BP 120/70  Pulse 79  Temp 98.2 F (36.8 C) (Oral)  Wt 169 lb (76.658 kg)  LMP 08/09/2012  There is no height on file to calculate BMI.  GENERAL: vitals reviewed and listed above, alert, oriented, appears well hydrated and in no acute distress HEENT: atraumatic, conjunctiva  clear, no obvious abnormalities on inspection of external nose and ears OP : no lesion edema or exudate  Op clear  NECK: no obvious masses on inspection palpation no adenopathy LUNGS: clear to auscultation bilaterally, no wheezes, rales or rhonchi, good air movement CV: HRRR, no clubbing cyanosis or  peripheral edema nl cap refill  Abdomen soft without guarding or rebound no masses are noted MS: moves all extremities without noticeable focal  abnormality Skin: Few fading papules on extremities slightly dry skin generally clear for head shows multiple papules some with induration but no pustules all throughout for head. Minimal papule on the head face PSYCH: pleasant and cooperative, no obvious depression or anxiety she is conversant and talkative. Normal speech.  ASSESSMENT AND PLAN:  Discussed the  following assessment and plan:  1. Diarrhea    Probably related to IBS although bowel overgrowth has been in the differential apparently has had major workups will review her records if any other ideas  2. Dermatitis    forehead using topical steroid ? if rebound   since mid  november .   3. IBS (irritable bowel syndrome)  4. Bright red rectal bleeding    History of same when her bowels act up usually hemorrhoidal has had colonoscopies and workups okay to use short-term suppositories   Overall she doesn't really want to take new medications asks what else there is for irritable bowel or other options we spent a good deal of time discussing the framework of what irritable bowel is and how someone could have more than one thing such as lactose intolerance to develop with age. At this time would try probiotics and another lactose lamination diet to see how she does follow up in about month.  We'll review her records.  I suspect the abdominal pain is really cramps that she has had before with her irritable bowel. -Patient advised to return or notify health care team  immediately if symptoms worsen or persist or new concerns arise.  Patient Instructions  Steroid  On the face can cause a different rash  Dermatitis.  Suggest  May need to withdraw from steroid.   Try again  Lactose  Free diet for another 2 weeks and do  Probiotic  andsee if calms down  Will look at record and get you original back.   To you  Ok to use supp in short temp.   Medicines are just sx  Control and not  A cure.   Some people have had success on the FODMAPS diet   Look on web md or other   There is no specific medicine affixes every symptom so treatment and management is very individualized. Can use suppositories as needed but not on a chronic basis and no more than 10 days at a time           [name]. Robley Matassa M.D. Total visit 40 mins > 50% spent counseling and coordinating care  Review or record

## 2012-09-01 NOTE — Telephone Encounter (Signed)
Patient Information:  Caller Name: Kamarri Fischetti  Phone: [phone]  Patient: Catherine [name]  Gender: Female  DOB: [birth year]  Age: 22 Years  PCP: Berniece Andreas [facility])  Pregnant: No  Office Follow Up:  Does the office need to follow up with this patient?: Yes  Instructions For The Office: Child was made appt by the office today at 2:30 for evaluation.   Symptoms  Reason For Call & Symptoms: Mother is calling about daughter. 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   Mother states that she is having blood in stool (diarrhea normal for patient) unknown number of stools. Problematic since the Holidays. No vomiting. +abdominal pain lower abdomen. Described as intermittent with bouts of diarrhea.  Mother is unable to fully answer triage questions. Child is with her. Mother states she has appt 2:30 pm today  Reviewed Health History In EMR: Yes  Reviewed Medications In EMR: Yes  Reviewed Allergies In EMR: Yes  Reviewed Surgeries / Procedures: No  Date of Onset of Symptoms: 08/09/2012  Treatments Tried: Immodium  Treatments Tried Worked: No OB / GYN:  LMP: Unknown  Guideline(s) Used:  Abdominal Pain - Female  Disposition Per Guideline:   See Today in Office  Reason For Disposition Reached:   Moderate or mild pain that comes and goes (cramps) lasts > 24 hours  Advice Given:  Rest:  Lie down and rest until you feel better.  Fluids:  Sip clear fluids only (e.g., water, flat soft drinks or 1/2 strength fruit juice) until the pain has been gone for over 2 hours. Then slowly return to a regular diet.  Fluids:  Sip clear fluids only (e.g., water, flat soft drinks or 1/2 strength fruit juice) until the pain has been gone for over 2 hours. Then slowly return to a regular diet.  Diet:  Slowly advance diet from clear liquids to a bland diet  Avoid alcohol or caffeinated beverages  Avoid greasy or fatty foods.  Avoid NSAIDS and Aspirin  : Avoid any  drug that can irritate the stomach lining and make the pain worse (especially aspirin and NSAIDs like ibuprofen).  Pass a BM:  Sit on the toilet and try to pass a bowel movement (BM). Do not strain. This may relieve the pain if it is due to constipation or impending diarrhea.  Call Back If:  Abdominal pain is constant and present for more than 2 hours  Abdominal pains come and go and are present for more than 24 hours  You are pregnant  You become worse.

## 2012-09-03 DIAGNOSIS — L309 Dermatitis, unspecified: Secondary | ICD-10-CM | POA: Insufficient documentation

## 2012-09-05 ENCOUNTER — Encounter: Payer: Self-pay | Admitting: Internal Medicine

## 2012-09-05 ENCOUNTER — Ambulatory Visit (INDEPENDENT_AMBULATORY_CARE_PROVIDER_SITE_OTHER): Payer: 59 | Admitting: Internal Medicine

## 2012-09-05 ENCOUNTER — Telehealth: Payer: Self-pay | Admitting: Internal Medicine

## 2012-09-05 ENCOUNTER — Ambulatory Visit (INDEPENDENT_AMBULATORY_CARE_PROVIDER_SITE_OTHER)
Admission: RE | Admit: 2012-09-05 | Discharge: 2012-09-05 | Disposition: A | Discharge status: Home / Self Care | Payer: 59 | Source: Ambulatory Visit | Attending: Internal Medicine | Admitting: Internal Medicine

## 2012-09-05 VITALS — BP 116/80 | HR 93 | Temp 98.4°F | Wt 171.0 lb

## 2012-09-05 DIAGNOSIS — M109 Gout, unspecified: Secondary | ICD-10-CM

## 2012-09-05 DIAGNOSIS — M7989 Other specified soft tissue disorders: Secondary | ICD-10-CM | POA: Insufficient documentation

## 2012-09-05 DIAGNOSIS — Z87442 Personal history of urinary calculi: Secondary | ICD-10-CM

## 2012-09-05 MED ORDER — PREDNISONE 20 MG PO TABS: ORAL_TABLET | ORAL | Status: DC

## 2012-09-05 NOTE — Patient Instructions (Addendum)
Get x ray of  Your foot  Can treat as if gout and do  Labs later    Uric acid levels may not be  As accurate  If this is a gout attack .  Plan lab tests in about 10 days to 2 week and then ROV . i will put in order.s   Gout Gout is an inflammatory condition (arthritis) caused by a buildup of uric acid crystals in the joints. Uric acid is a chemical that is normally present in the blood. Under some circumstances, uric acid can form into crystals in your joints. This causes joint redness, soreness, and swelling (inflammation). Repeat attacks are common. Over time, uric acid crystals can form into masses (tophi) near a joint, causing disfigurement. Gout is treatable and often preventable. CAUSES  The disease begins with elevated levels of uric acid in the blood. Uric acid is produced by your body when it breaks down a naturally found substance called purines. This also happens when you eat certain foods such as meats and fish. Causes of an elevated uric acid level include:  Being passed down from parent to child (heredity).  Diseases that cause increased uric acid production (obesity, psoriasis, some cancers).  Excessive alcohol use.  Diet, especially diets rich in meat and seafood.  Medicines, including certain cancer-fighting drugs (chemotherapy), diuretics, and aspirin.  Chronic kidney disease. The kidneys are no longer able to remove uric acid well.  Problems with metabolism. Conditions strongly associated with gout include:  Obesity.  High blood pressure.  High cholesterol.  Diabetes. Not everyone with elevated uric acid levels gets gout. It is not understood why some people get gout and others do not. Surgery, joint injury, and eating too much of certain foods are some of the factors that can lead to gout. SYMPTOMS   An attack of gout comes on quickly. It causes intense pain with redness, swelling, and warmth in a joint.  Fever can occur.  Often, only one joint is involved.  Certain joints are more commonly involved:  Base of the big toe.  Knee.  Ankle.  Wrist.  Finger. Without treatment, an attack usually goes away in a few days to weeks. Between attacks, you usually will not have symptoms, which is different from many other forms of arthritis. DIAGNOSIS  Your caregiver will suspect gout based on your symptoms and exam. Removal of fluid from the joint (arthrocentesis) is done to check for uric acid crystals. Your caregiver will give you a medicine that numbs the area (local anesthetic) and use a needle to remove joint fluid for exam. Gout is confirmed when uric acid crystals are seen in joint fluid, using a special microscope. Sometimes, blood, urine, and X-ray tests are also used. TREATMENT  There are 2 phases to gout treatment: treating the sudden onset (acute) attack and preventing attacks (prophylaxis). Treatment of an Acute Attack  Medicines are used. These include anti-inflammatory medicines or steroid medicines.  An injection of steroid medicine into the affected joint is sometimes necessary.  The painful joint is rested. Movement can worsen the arthritis.  You may use warm or cold treatments on painful joints, depending which works best for you.  Discuss the use of coffee, vitamin C, or cherries with your caregiver. These may be helpful treatment options. Treatment to Prevent Attacks After the acute attack subsides, your caregiver may advise prophylactic medicine. These medicines either help your kidneys eliminate uric acid from your body or decrease your uric acid production. You may need  to stay on these medicines for a very long time. The early phase of treatment with prophylactic medicine can be associated with an increase in acute gout attacks. For this reason, during the first few months of treatment, your caregiver may also advise you to take medicines usually used for acute gout treatment. Be sure you understand your caregiver's  directions. You should also discuss dietary treatment with your caregiver. Certain foods such as meats and fish can increase uric acid levels. Other foods such as dairy can decrease levels. Your caregiver can give you a list of foods to avoid. HOME CARE INSTRUCTIONS   Do not take aspirin to relieve pain. This raises uric acid levels.  Only take over-the-counter or prescription medicines for pain, discomfort, or fever as directed by your caregiver.  Rest the joint as much as possible. When in bed, keep sheets and blankets off painful areas.  Keep the affected joint raised (elevated).  Use crutches if the painful joint is in your leg.  Drink enough water and fluids to keep your urine clear or pale yellow. This helps your body get rid of uric acid. Do not drink alcoholic beverages. They slow the passage of uric acid.  Follow your caregiver's dietary instructions. Pay careful attention to the amount of protein you eat. Your daily diet should emphasize fruits, vegetables, whole grains, and fat-free or low-fat milk products.  Maintain a healthy body weight. SEEK MEDICAL CARE IF:   You have an oral temperature above 102 F (38.9 C).  You develop diarrhea, vomiting, or any side effects from medicines.  You do not feel better in 24 hours, or you are getting worse. SEEK IMMEDIATE MEDICAL CARE IF:   Your joint becomes suddenly more tender and you have:  Chills.  An oral temperature above 102 F (38.9 C), not controlled by medicine. MAKE SURE YOU:   Understand these instructions.  Will watch your condition.  Will get help right away if you are not doing well or get worse. Document Released: 07/30/2000 Document Revised: 10/25/2011 Document Reviewed: 11/10/2009 [facility] Patient Information 2013 [place], [place].

## 2012-09-05 NOTE — Progress Notes (Signed)
Chief Complaint  Patient presents with  . Foot Swelling    Stiffness in joints.  Says she feels hard bumps in the top of her foot.  Started about 2 weeks ago.  . Skin Discoloration    HPI: Patient comes in today for SDA for  new problem evaluation. Patient states that over the last days she's had increasing pain in her left foot without associated trauma she had mild soreness when she was here last week but didn't mention it. However at this week it is becoming increasing painful hard to walk just touching is painful but without associated fever or redness it is discolored at the MTP and the lateral foot. She's not had this problem before.  She is currently on a slow weaning dose of Topamax that was used for her headaches ;because she had tremors at high dose. Because this medicine can raise uric acid the suspicion of gout was discussed with her neurologist. She's not had laboratory tests done in this regard. She's never had gout.  She has a past history of kidney stones when she was on Topamax presumed calcium because it runs in the family but was never analyzed.   In regard to her diarrhea she did try a line and it is helping some although does have a small amount lactose in it  ROS: See pertinent positives and negatives per HPI. No falling bleeding or other new symptoms.  Past Medical History  Diagnosis Date  . Anxiety     Counseling and psychiatry  . Chronic headaches     Sees specialist  . Thyroid disease   . History of lower GI bleeding   . PUD (peptic ulcer disease)   . IBS (irritable bowel syndrome)   . PCOS (polycystic ovarian syndrome)   . Oligomenorrhea   . Goiter   . Hashimoto's thyroiditis   . Obesity   . Dyspepsia   . Hypothyroidism, acquired, autoimmune   . Fatigue   . History of shingles     face right   . History of recurrent UTIs     Sees urologist  . Hx of varicella     Family History  Problem Relation Age of Onset  . Breast cancer Maternal Grandmother    . Colon cancer Maternal Grandmother   . Colon polyps Maternal Grandmother   . Cancer Maternal Grandmother   . Diabetes Paternal Grandfather   . Lupus Paternal Grandmother   . Thyroid disease Paternal Grandmother     Hypothyroid without surgery or irradiation.  . Osteopenia Mother   . Obesity Mother     History   Social History  . Marital Status: Single    Spouse Name: N/A    Number of Children: 0  . Years of Education: N/A   Occupational History  . student    Social History Main Topics  . Smoking status: Never Smoker   . Smokeless tobacco: Never Used  . Alcohol Use: No  . Drug Use: No  . Sexually Active: Yes    Birth Control/ Protection: Pill   Other Topics Concern  . None   Social History Narrative   Lives by herself with 5 cats near [facility] apartment. Household to 3 at home 10 hours of sleepNegative alcohol tobacco some caffeine occasionallyFifth year senior [employer]. 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 Will be difficult to go back financially.    Outpatient Encounter Prescriptions as of 09/05/2012  Medication Sig  Dispense Refill  . ALPRAZolam (XANAX XR) 0.5 MG 24 hr tablet Take by mouth every morning.      . cetirizine (ZYRTEC) 10 MG tablet Take 10 mg by mouth daily.        [name] desvenlafaxine (PRISTIQ) 50 MG 24 hr tablet Take 50 mg by mouth daily.      [name] dicyclomine (BENTYL) 10 MG capsule Take 1 tablet by mouth every 4 hours as needed for pain.  120 capsule  0  . hydrocortisone (ANUSOL-HC) 25 MG suppository Place 1 suppository (25 mg total) rectally at bedtime. When flaring  12 suppository  1  . loperamide (IMODIUM A-D) 2 MG tablet Take as directed  1 tablet  0  . Norethindrone-Ethinyl Estradiol-Fe (GENERESS FE) 0.8-25 MG-MCG tablet Chew 1 tablet by mouth daily.        [name] topiramate (TOPAMAX) 25 MG capsule Take 25 mg by mouth. Every other day      . predniSONE (DELTASONE) 20 MG tablet Take 3,3,3,2,2,2,1,1,1, 1/.2 1./2 1/.2  pills qd  24 tablet  0    EXAM:  BP 116/80  Pulse 93  Temp 98.4 F (36.9 C) (Oral)  Wt 171 lb (77.565 kg)  SpO2 97%  LMP 08/09/2012  There is no height on file to calculate BMI.  GENERAL: vitals reviewed and listed above, alert, oriented, appears well hydrated and in no acute distress She is walking with a limp. HEENT: atraumatic, conjunctiva  clear, no obvious abnormalities on inspection of external nose and ears OP : no lesion edema or exudate   NECK: no obvious masses on inspection palpation  MS: Left foot with distal swelling without redness but some purplish inflammation MTP joint is discolored and tender but not warm. Lateral foot fifth metatarsal has some hard feeling bumps on the top of the foot but no ulcers or skin changes otherwise. Neurovascular is otherwise intact. PSYCH: pleasant and cooperative,  cognitively intact. Lab Results  Component Value Date   WBC 8.0 03/02/2012   HGB 15.1* 03/02/2012   HCT 45.4 03/02/2012   PLT 292 03/02/2012   GLUCOSE 78 03/02/2012   ALT 12 03/02/2012   AST 13 03/02/2012   NA 139 03/02/2012   K 4.2 03/02/2012   CL 106 03/02/2012   CREATININE 0.83 03/02/2012   BUN 11 03/02/2012   CO2 24 03/02/2012   TSH 1.208 02/18/2012    ASSESSMENT AND PLAN:  Discussed the following assessment and plan:  1. Foot swelling  DG Foot Complete Left, predniSONE (DELTASONE) 20 MG tablet, Basic metabolic panel, CBC with Differential, Uric acid, Sedimentation rate, POCT Urinalysis, Dipstick, Hepatic function panel  2. Gout attack possible.   DG Foot Complete Left, predniSONE (DELTASONE) 20 MG tablet, Basic metabolic panel, CBC with Differential, Uric acid, Sedimentation rate, POCT Urinalysis, Dipstick, Hepatic function panel   no signs of infection  cant take nsaids  prednisone x ray and labs in 10 -14 days  3. Hx of urinary stone      Level of pain and swelling. Typical of gout although not warm  is very tender. history of kidney stones on the analyzed. Has been  on Topamax which puts 1 at risk for kidney stones and metabolic acidosis we'll check laboratory studies in 10 days or so BMP uric acid CBC etc.  Diarrhea is improving do not think the align is causing this problem   -Patient advised to return or notify health care team  immediately if symptoms worsen or persist or new concerns arise.  Patient Instructions  Get x ray of  Your foot  Can treat as if gout and do  Labs later    Uric acid levels may not be  As accurate  If this is a gout attack .  Plan lab tests in about 10 days to 2 week and then ROV . i will put in order.s   Gout Gout is an inflammatory condition (arthritis) caused by a buildup of uric acid crystals in the joints. Uric acid is a chemical that is normally present in the blood. Under some circumstances, uric acid can form into crystals in your joints. This causes joint redness, soreness, and swelling (inflammation). Repeat attacks are common. Over time, uric acid crystals can form into masses (tophi) near a joint, causing disfigurement. Gout is treatable and often preventable. CAUSES  The disease begins with elevated levels of uric acid in the blood. Uric acid is produced by your body when it breaks down a naturally found substance called purines. This also happens when you eat certain foods such as meats and fish. Causes of an elevated uric acid level include:  Being passed down from parent to child (heredity).  Diseases that cause increased uric acid production (obesity, psoriasis, some cancers).  Excessive alcohol use.  Diet, especially diets rich in meat and seafood.  Medicines, including certain cancer-fighting drugs (chemotherapy), diuretics, and aspirin.  Chronic kidney disease. The kidneys are no longer able to remove uric acid well.  Problems with metabolism. Conditions strongly associated with gout include:  Obesity.  High blood pressure.  High cholesterol.  Diabetes. Not everyone with elevated uric acid  levels gets gout. It is not understood why some people get gout and others do not. Surgery, joint injury, and eating too much of certain foods are some of the factors that can lead to gout. SYMPTOMS   An attack of gout comes on quickly. It causes intense pain with redness, swelling, and warmth in a joint.  Fever can occur.  Often, only one joint is involved. Certain joints are more commonly involved:  Base of the big toe.  Knee.  Ankle.  Wrist.  Finger. Without treatment, an attack usually goes away in a few days to weeks. Between attacks, you usually will not have symptoms, which is different from many other forms of arthritis. DIAGNOSIS  Your caregiver will suspect gout based on your symptoms and exam. Removal of fluid from the joint (arthrocentesis) is done to check for uric acid crystals. Your caregiver will give you a medicine that numbs the area (local anesthetic) and use a needle to remove joint fluid for exam. Gout is confirmed when uric acid crystals are seen in joint fluid, using a special microscope. Sometimes, blood, urine, and X-ray tests are also used. TREATMENT  There are 2 phases to gout treatment: treating the sudden onset (acute) attack and preventing attacks (prophylaxis). Treatment of an Acute Attack  Medicines are used. These include anti-inflammatory medicines or steroid medicines.  An injection of steroid medicine into the affected joint is sometimes necessary.  The painful joint is rested. Movement can worsen the arthritis.  You may use warm or cold treatments on painful joints, depending which works best for you.  Discuss the use of coffee, vitamin C, or cherries with your caregiver. These may be helpful treatment options. Treatment to Prevent Attacks After the acute attack subsides, your caregiver may advise prophylactic medicine. These medicines either help your kidneys eliminate uric acid from your body or decrease your uric acid production.  You may need  to stay on these medicines for a very long time. The early phase of treatment with prophylactic medicine can be associated with an increase in acute gout attacks. For this reason, during the first few months of treatment, your caregiver may also advise you to take medicines usually used for acute gout treatment. Be sure you understand your caregiver's directions. You should also discuss dietary treatment with your caregiver. Certain foods such as meats and fish can increase uric acid levels. Other foods such as dairy can decrease levels. Your caregiver can give you a list of foods to avoid. HOME CARE INSTRUCTIONS   Do not take aspirin to relieve pain. This raises uric acid levels.  Only take over-the-counter or prescription medicines for pain, discomfort, or fever as directed by your caregiver.  Rest the joint as much as possible. When in bed, keep sheets and blankets off painful areas.  Keep the affected joint raised (elevated).  Use crutches if the painful joint is in your leg.  Drink enough water and fluids to keep your urine clear or pale yellow. This helps your body get rid of uric acid. Do not drink alcoholic beverages. They slow the passage of uric acid.  Follow your caregiver's dietary instructions. Pay careful attention to the amount of protein you eat. Your daily diet should emphasize fruits, vegetables, whole grains, and fat-free or low-fat milk products.  Maintain a healthy body weight. SEEK MEDICAL CARE IF:   You have an oral temperature above 102 F (38.9 C).  You develop diarrhea, vomiting, or any side effects from medicines.  You do not feel better in 24 hours, or you are getting worse. SEEK IMMEDIATE MEDICAL CARE IF:   Your joint becomes suddenly more tender and you have:  Chills.  An oral temperature above 102 F (38.9 C), not controlled by medicine. MAKE SURE YOU:   Understand these instructions.  Will watch your condition.  Will get help right away if you  are not doing well or get worse. Document Released: 07/30/2000 Document Revised: 10/25/2011 Document Reviewed: 11/10/2009 [facility] Patient Information 2013 [place], [place].   [name]. Panosh M.D.

## 2012-09-05 NOTE — Telephone Encounter (Signed)
Patient Information:  Caller Name: Dewayne Hatch  Phone: [phone]  Patient: Catherine, [name]  Gender: Female  DOB: 13-Jun-1991  Age: 22 Years  PCP: Berniece Andreas (Family Practice)  Pregnant: No  Office Follow Up:  Does the office need to follow up with this patient?: Yes  Instructions For The Office: ED disp; wondering if she can be worked in the office   Symptoms  Reason For Call & Symptoms: problems started a wk and a half ago with the left toe joint; has swelling and redness throughout the foot now;  has difficulty walking; heat bothers it; can't put a sock on it; neurologist said she was on too high dosage of Topamax and that is being tapered; uric acid levels were high; feels that there is gout in the foot; some areas have a purplish tint to them; looks tight and shiny  Reviewed Health History In EMR: Yes  Reviewed Medications In EMR: Yes  Reviewed Allergies In EMR: Yes  Reviewed Surgeries / Procedures: Yes  Date of Onset of Symptoms: Unknown OB / GYN:  LMP: Unknown  Guideline(s) Used:  Foot Pain  Disposition Per Guideline:   Go to ED Now  Reason For Disposition Reached:   Purple or black skin on foot or toe  Advice Given:  Call Back If:  You have more questions.  RN Overrode Recommendation:  Follow Up With Office Later  Please call back about appt

## 2012-09-05 NOTE — Telephone Encounter (Signed)
Appt made for 3L45 per [facility]. Called and LMOM at [facility] to have them fax Catherine [name]'s most recent labs over.

## 2012-09-08 NOTE — Telephone Encounter (Signed)
LMOM

## 2012-09-19 ENCOUNTER — Other Ambulatory Visit (INDEPENDENT_AMBULATORY_CARE_PROVIDER_SITE_OTHER): Payer: 59

## 2012-09-19 DIAGNOSIS — M109 Gout, unspecified: Secondary | ICD-10-CM

## 2012-09-19 DIAGNOSIS — M7989 Other specified soft tissue disorders: Secondary | ICD-10-CM

## 2012-09-19 LAB — CBC WITH DIFFERENTIAL/PLATELET
Basophils Relative: 0.4 % (ref 0.0–3.0)
Eosinophils Absolute: 0.1 10*3/uL (ref 0.0–0.7)
Lymphs Abs: 4 10*3/uL (ref 0.7–4.0)
MCHC: 33.8 g/dL (ref 30.0–36.0)
MCV: 88.9 fl (ref 78.0–100.0)
Monocytes Absolute: 0.7 10*3/uL (ref 0.1–1.0)
Neutro Abs: 11.2 10*3/uL — ABNORMAL HIGH (ref 1.4–7.7)
Neutrophils Relative %: 69.6 % (ref 43.0–77.0)
RBC: 4.75 Mil/uL (ref 3.87–5.11)

## 2012-09-19 LAB — HEPATIC FUNCTION PANEL
ALT: 19 U/L (ref 0–35)
AST: 13 U/L (ref 0–37)
Albumin: 3.2 g/dL — ABNORMAL LOW (ref 3.5–5.2)
Total Protein: 6.3 g/dL (ref 6.0–8.3)

## 2012-09-19 LAB — BASIC METABOLIC PANEL
CO2: 30 mEq/L (ref 19–32)
Chloride: 102 mEq/L (ref 96–112)
Creatinine, Ser: 0.7 mg/dL (ref 0.4–1.2)

## 2012-09-19 LAB — SEDIMENTATION RATE: Sed Rate: 10 mm/hr (ref 0–22)

## 2012-09-21 ENCOUNTER — Other Ambulatory Visit: Payer: Self-pay | Admitting: "Endocrinology

## 2012-09-22 ENCOUNTER — Ambulatory Visit (HOSPITAL_COMMUNITY)
Admission: RE | Admit: 2012-09-22 | Discharge: 2012-09-22 | Disposition: A | Discharge status: Home / Self Care | Payer: 59 | Source: Ambulatory Visit | Attending: "Endocrinology | Admitting: "Endocrinology

## 2012-09-22 DIAGNOSIS — Z13820 Encounter for screening for osteoporosis: Secondary | ICD-10-CM | POA: Insufficient documentation

## 2012-09-22 DIAGNOSIS — Z8781 Personal history of (healed) traumatic fracture: Secondary | ICD-10-CM | POA: Insufficient documentation

## 2012-09-26 ENCOUNTER — Ambulatory Visit: Payer: 59 | Admitting: Internal Medicine

## 2012-09-30 ENCOUNTER — Other Ambulatory Visit: Payer: Self-pay

## 2012-10-03 ENCOUNTER — Ambulatory Visit (INDEPENDENT_AMBULATORY_CARE_PROVIDER_SITE_OTHER): Payer: 59 | Admitting: Internal Medicine

## 2012-10-03 ENCOUNTER — Encounter: Payer: Self-pay | Admitting: Internal Medicine

## 2012-10-03 VITALS — BP 100/74 | HR 104 | Temp 98.6°F | Ht 64.5 in | Wt 169.0 lb

## 2012-10-03 DIAGNOSIS — M7989 Other specified soft tissue disorders: Secondary | ICD-10-CM

## 2012-10-03 DIAGNOSIS — N898 Other specified noninflammatory disorders of vagina: Secondary | ICD-10-CM

## 2012-10-03 DIAGNOSIS — Z0000 Encounter for general adult medical examination without abnormal findings: Secondary | ICD-10-CM

## 2012-10-03 DIAGNOSIS — R197 Diarrhea, unspecified: Secondary | ICD-10-CM

## 2012-10-03 NOTE — Patient Instructions (Addendum)
Continue align or probiotics and can try adding back foods every 4- 7 days to see if getting GI sx.   Will ley you know about screening tests.  Recheck if new joint  Flare up again . msuspect this was related to the topamax your uric acid level was good. Really acted like gout.    See Dr Vincente Poli to check your exam as we discussed

## 2012-10-03 NOTE — Progress Notes (Signed)
Chief Complaint  Patient presents with  . Annual Exam    HPI: Patient comes in today for Preventive Health Care visit   since last visit : She has made some changes to her diet and has done a lactose elimination and use them probiotic such as align and feels like her stomach is getting better. No other change in her medication.  Her left foot swelling decreased dramatically on the prednisone treatment. Has to come back at this time she is now off of the Topamax which might of been the culprit. No other joint pains coming or unusual rashes. She has a concern of some bumps in the vaginal area she's noted over the last week or 2 without significant pain unusual discharge.  Condoms most of time . ROS:  GEN/ HEENT: No fever, significant weight changes sweats headaches vision problems hearing changes, CV/ PULM; No chest pain shortness of breath cough, syncope,edema  change in exercise tolerance. GI /GU: No adominal pain, vomiting, change in bowel habits.  EXCEPT AS ABOVE No blood in the stool. No significant GU symptoms. Periods   Ok on ocps  Still have heavy bleeding  But regular.  SKIN/HEME: ,no acute skin rashes suspicious lesions or bleeding. No lymphadenopathy, nodules, masses.  NEURO/ PSYCH:  No neurologic signs such as weakness numbness. No depression anxiety. IMM/ Allergy: No unusual infections.  Allergy .   REST of 12 system review negative except as per HPI   Past Medical History  Diagnosis Date  . Anxiety     Counseling and psychiatry  . Chronic headaches     Sees specialist  . Thyroid disease   . History of lower GI bleeding   . PUD (peptic ulcer disease)   . IBS (irritable bowel syndrome)   . PCOS (polycystic ovarian syndrome)   . Oligomenorrhea   . Goiter   . Hashimoto's thyroiditis   . Obesity   . Dyspepsia   . Hypothyroidism, acquired, autoimmune   . Fatigue   . History of shingles     face right   . History of recurrent UTIs     Sees urologist  . Hx of  varicella     Family History  Problem Relation Age of Onset  . Breast cancer Maternal Grandmother   . Colon cancer Maternal Grandmother   . Colon polyps Maternal Grandmother   . Cancer Maternal Grandmother   . Diabetes Paternal Grandfather   . Lupus Paternal Grandmother   . Thyroid disease Paternal Grandmother     Hypothyroid without surgery or irradiation.  . Osteopenia Mother   . Obesity Mother     History   Social History  . Marital Status: Single    Spouse Name: N/A    Number of Children: 0  . Years of Education: N/A   Occupational History  . student    Social History Main Topics  . Smoking status: Never Smoker   . Smokeless tobacco: Never Used  . Alcohol Use: No  . Drug Use: No  . Sexually Active: Yes    Birth Control/ Protection: Pill   Other Topics Concern  . None   Social History Narrative   Lives by herself with 5 cats near [facility] apartment. 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 Will be difficult to go back financially.  Outpatient Encounter Prescriptions as of 10/03/2012  Medication Sig Dispense Refill  . ALPRAZolam (XANAX XR) 0.5 MG 24 hr tablet Take by mouth every morning.      . cetirizine (ZYRTEC) 10 MG tablet Take 10 mg by mouth daily.        [name] desvenlafaxine (PRISTIQ) 50 MG 24 hr tablet Take 50 mg by mouth daily.      [name] dicyclomine (BENTYL) 10 MG capsule Take 1 tablet by mouth every 4 hours as needed for pain.  120 capsule  0  . loperamide (IMODIUM A-D) 2 MG tablet Take as directed  1 tablet  0  . Norethindrone-Ethinyl Estradiol-Fe (GENERESS FE) 0.8-25 MG-MCG tablet Chew 1 tablet by mouth daily.        . hydrocortisone (ANUSOL-HC) 25 MG suppository Place 1 suppository (25 mg total) rectally at bedtime. When flaring  12 suppository  1  . [DISCONTINUED] predniSONE (DELTASONE) 20 MG  tablet Take 3,3,3,2,2,2,1,1,1, 1/.2 1./2 1/.2 pills qd  24 tablet  0  . [DISCONTINUED] topiramate (TOPAMAX) 25 MG capsule Take 25 mg by mouth. Every other day       No facility-administered encounter medications on file as of 10/03/2012.    EXAM:  BP 100/74  Pulse 104  Temp(Src) 98.6 F (37 C) (Oral)  Ht 5' 4.5" (1.638 m)  Wt 169 lb (76.658 kg)  BMI 28.57 kg/m2  SpO2 98%  LMP 09/12/2011  Body mass index is 28.57 kg/(m^2).  Physical Exam: Vital signs reviewed AVW:UJWJ is a well-developed well-nourished alert cooperative   female who appears her stated age in no acute distress.  HEENT: normocephalic atraumatic , Eyes: PERRL EOM's full, conjunctiva clear, Nares: paten,t no deformity discharge or tenderness., Ears: no deformity EAC's clear TMs with normal landmarks. Mouth: clear OP, no lesions, edema.  Moist mucous membranes. Dentition in adequate repair. NECK: supple without masses, thyromegaly or bruits. CHEST/PULM:  Clear to auscultation and percussion breath sounds equal no wheeze , rales or rhonchi. No chest wall deformities or tenderness. Breast: normal by inspection . No dimpling, discharge, masses, tenderness or discharge . CV: PMI is nondisplaced, S1 S2 no gallops, murmurs, rubs. Peripheral pulses are full without delay.No JVD .  ABDOMEN: Bowel sounds normal nontender  No guard or rebound, no hepato splenomegal no CVA tenderness.  No hernia. Extremtities:  No clubbing cyanosis or edema, no acute joint swelling or redness no focal atrophy NEURO:  Oriented x3, cranial nerves 3-12 appear to be intact, no obvious focal weakness,gait within normal limits no abnormal reflexes or asymmetrical SKIN: No acute rashes normal turgor, color, no bruising or petechiae. PSYCH: Oriented, good eye contact, no obvious depression anxiety, cognition and judgment appear normal. LN: no cervical axillary inguinal adenopathy EXT GU pink mucosa femal posterior orifice small pink palpable bumps poss  verrucal in nature non tender and no ulcers or discharge or adenopathy   Lab Results  Component Value Date   WBC 16.0* 09/19/2012   HGB 14.3 09/19/2012   HCT 42.2 09/19/2012   PLT 264.0 09/19/2012   GLUCOSE 77 09/19/2012   ALT 19 09/19/2012   AST 13 09/19/2012   NA 137 09/19/2012   K 3.8 09/19/2012   CL 102 09/19/2012   CREATININE 0.7 09/19/2012   BUN 11 09/19/2012   CO2 30 09/19/2012   TSH 1.208 02/18/2012    ASSESSMENT AND PLAN:  Discussed the following assessment and plan:  Visit for preventive health examination - counseled healthy LS I believe the elevated white blood cell count  is probably left over from the prednisone treatment. - Plan: GC/chlamydia probe amp, urine  Encounter for preventive health examination  Diarrhea  - gi distrubance better with  some diatery manipualtions at this time  contnue with lactose free and add back stay on the align for now  Foot swelling - gout presumed dramatically improved has small bunion nolesions . labs good ? if from  the topamax   Vaginal lesions - These could veruccae  versus other  new if so rx options.s  will have her see dr Vincente Poli for opinion. pt will make appt .  If the joint swelling heat or otherwise is recurrent we will do further evaluation. But at this time we'll chalk this up to a one time problem triggered by medication. Uric acid levels have been normal. Disc confidentiality and privacy    Patient Care Team: Madelin Headings, MD as PCP - General (Internal Medicine) Melvyn Novas, MD (Neurology) Jeani Hawking, MD as Attending Physician (Obstetrics and Gynecology) Garnett Farm, MD as Attending Physician (Urology) Hilarie Fredrickson, MD (Gastroenterology) Patient Instructions  Continue align or probiotics and can try adding back foods every 4- 7 days to see if getting GI sx.   Will ley you know about screening tests.  Recheck if new joint  Flare up again . msuspect this was related to the topamax your uric acid level was good. Really acted  like gout.    See Dr Vincente Poli to check your exam as we discussed     [name]. Lavella Myren M.D.  Health Maintenance  Topic Date Due  . Influenza Vaccine  04/16/1990  . Pap Smear  03/17/2015  . Tetanus/tdap  03/16/2020   Health Maintenance Review  Assume utd  immuniz never had hpv   Past records  Gi and urology to scan in records  Neg SBB

## 2012-10-04 ENCOUNTER — Telehealth: Payer: Self-pay | Admitting: Internal Medicine

## 2012-10-04 LAB — GC/CHLAMYDIA PROBE AMP, URINE: Chlamydia, Swab/Urine, PCR: NEGATIVE

## 2012-10-04 NOTE — Telephone Encounter (Signed)
Pt's mother, Dewayne Hatch left medical records to be copied about a month ago.  In a white 10X13 envelope. Pls copy and return as soon as possible. Pls call and confirm you have the records.

## 2012-10-05 ENCOUNTER — Encounter: Payer: Self-pay | Admitting: Internal Medicine

## 2012-10-05 NOTE — Telephone Encounter (Signed)
Patient's mother notified by telephone that records have been copied and placed in the mail for return.

## 2012-10-05 NOTE — Telephone Encounter (Signed)
Ok to return please copy and scan in the clipped papers and do not shred.

## 2012-10-06 DIAGNOSIS — N898 Other specified noninflammatory disorders of vagina: Secondary | ICD-10-CM | POA: Insufficient documentation

## 2012-10-13 ENCOUNTER — Other Ambulatory Visit: Payer: Self-pay | Admitting: *Deleted

## 2012-10-13 DIAGNOSIS — E063 Autoimmune thyroiditis: Secondary | ICD-10-CM

## 2012-10-24 LAB — TSH: TSH: 0.812 u[IU]/mL (ref 0.350–4.500)

## 2012-10-24 LAB — T3, FREE: T3, Free: 2.9 pg/mL (ref 2.3–4.2)

## 2012-10-24 LAB — IRON: Iron: 114 ug/dL (ref 42–145)

## 2012-10-24 LAB — T4, FREE: Free T4: 1.02 ng/dL (ref 0.80–1.80)

## 2012-10-27 LAB — VITAMIN D 1,25 DIHYDROXY: Vitamin D 1, 25 (OH)2 Total: 93 pg/mL — ABNORMAL HIGH (ref 18–72)

## 2012-11-07 ENCOUNTER — Ambulatory Visit: Payer: 59 | Admitting: "Endocrinology

## 2012-12-19 ENCOUNTER — Ambulatory Visit (INDEPENDENT_AMBULATORY_CARE_PROVIDER_SITE_OTHER): Payer: BC Managed Care – PPO | Admitting: "Endocrinology

## 2012-12-19 ENCOUNTER — Encounter: Payer: Self-pay | Admitting: "Endocrinology

## 2012-12-19 VITALS — BP 114/64 | HR 78 | Wt 172.2 lb

## 2012-12-19 DIAGNOSIS — F341 Dysthymic disorder: Secondary | ICD-10-CM

## 2012-12-19 DIAGNOSIS — R5383 Other fatigue: Secondary | ICD-10-CM

## 2012-12-19 DIAGNOSIS — F329 Major depressive disorder, single episode, unspecified: Secondary | ICD-10-CM

## 2012-12-19 DIAGNOSIS — M8580 Other specified disorders of bone density and structure, unspecified site: Secondary | ICD-10-CM

## 2012-12-19 DIAGNOSIS — F32A Depression, unspecified: Secondary | ICD-10-CM

## 2012-12-19 DIAGNOSIS — E063 Autoimmune thyroiditis: Secondary | ICD-10-CM

## 2012-12-19 DIAGNOSIS — M899 Disorder of bone, unspecified: Secondary | ICD-10-CM

## 2012-12-19 DIAGNOSIS — E559 Vitamin D deficiency, unspecified: Secondary | ICD-10-CM

## 2012-12-19 DIAGNOSIS — E049 Nontoxic goiter, unspecified: Secondary | ICD-10-CM

## 2012-12-19 DIAGNOSIS — R5381 Other malaise: Secondary | ICD-10-CM

## 2012-12-19 DIAGNOSIS — T07XXXA Unspecified multiple injuries, initial encounter: Secondary | ICD-10-CM

## 2012-12-19 NOTE — Progress Notes (Signed)
Subjective:  Patient Name: Catherine [name] Date of Birth: 1990-12-09  MRN: [id number]  Catherine [name]  presents to the office today for follow-up of her oligomenorrhea, PCOS, goiter, Hashimoto's thyroiditis, obesity, nausea and vomiting, dyspepsia, hypothyroidism, and fatigue.   HISTORY OF PRESENT ILLNESS:   Catherine [name] is a 22 y.o. Caucasian young woman.  Catherine [name] was accompanied by her mother.  1. The patient was first referred to me on 09/09/06 by her gynecologist, Dr. Marcelle Overlie, for evaluation and management of secondary amenorrhea. She was then 22 years old.  A. Patient had been essentially healthy up through age 63-13, when she underwent menarche. Her menstrual cycles have never been regular. Her last normal menstrual period occurred in about 2005. She was not on any oral contraceptives  or contraceptive implants at that time. Another obstetrician put her on oral contraceptives at that point. She gained about 20 pounds in weight in that first year on oral contraceptives. She started evaluation with Dr. Vincente Poli in 2007 or so. Dr. Vincente Poli put her on Yaz initially, but later converted her to [facility]The patient's past medical history was positive for a lot of depression, mood swings, and panic attacks. She also had 2 knee surgeries in the seventh grade. She did not do any type of routine physical activity. Family history was positive for diabetes in the paternal grandfather and hypothyroidism in the paternal grandmother.  Father had severe depression and anxiety. Maternal grandmother had colon cancer. Mother was obese. Mother also had endometriosis. As I later learned on 07/10/12, one of Catherine [name]'s mother's first cousins had recently developed Addison's Disease.    B. On physical examination, her height was at the 35th percentile and her weight at 177.6 pounds was at the 93rd percentile. Her BMI was 30.7. She had a 25+ gram thyroid gland. She was tender in the right lobe. She also had 1+ acanthosis  nigricans. Previous laboratory data from 06/21/06 showed a normal CMP. Her TSH was 2.05 and her free T4 was 1.18. A serum glucose was 91 with a corresponding insulin level of 95, which was very excessive. Laboratory data at our visit included a blood glucose of 79 and hemoglobin A1c of 5.0%. Subsequent lab tests on 09/12/06 showed a fasting glucose of 72 and a fasting insulin of 22, again excessive. It appeared at that time that the patient was mildly obese. Her level of obesity was high enough, however, to cause a significant degree of insulin resistance and hyperinsulinemia. Her level of obesity may also have been high enough to cause oligomenorrhea. She had a goiter and clinical evidence of Hashimoto's disease, but she was euthyroid. I talked with the patient and her mother about our Eat Right Diet plan. I also talked about exercising 45-60 minutes a day. I started her on metformin, 500 mg, twice daily.  2. During the past 6 years, patient's weight has fluctuated up and down, varying between 150.7-179.4 lbs. She has continued to have flare ups of Hashimoto's disease. On 02/12/08 her TSH rose to 4.493, but then subsequently normalized. She has been euthyroid for most of the past 5 years. At times she's been on birth control patches, but most times has been on birth control pills. Her dyspepsia is better when she takes her Nexium and worse when she does not. Her anxiety and depression have been major problems for her. She has also had chronic fatigue, recurrent UTIs, and a myriad of gastrointestinal symptoms.   3. The patient's last [facility] visit was on 07/10/12. In the interim  she had been feeling a lot better since following Dr. Rosezella Florida suggestion to try a food elimination diet. Catherine [name] feels that she is intolerant to both gluten and dairy.  She also developed gout in her foot, which was ascribed to Topamax. She was treated with prednisone and gained 5 pounds, which she is trying to lose. Topamax was  discontinued. Since then she has also not been having UTIs.  She has been doing pretty well emotionally, but had panic attacks after stopping prednisone. The panic attacks have subsided. She feels that Pristiq is working well. Diarrhea and abdominal pains have resolved. The migraines have not been frequent since stopping Topamax. She is also taking Generess Fe OCPs.  She takes her multivitamin every day. She began taking vitamin D after she was told that her vitamin D level was low in march. She has not been working out lately, except for a trip to [place] recently. She has not had any more fractures.   4. Pertinent Review of Systems:  Constitutional: The patient feels "great".  She no longer sleeps a lot. Her energy level is much better. She is not depressed today.   Eyes: Vision is good. There are no significant eye complaints. Neck: The patient has no complaints of anterior neck swelling, soreness, tenderness,  pressure, discomfort, or difficulty swallowing.  Heart: Heart rate increases with exercise or other physical activity. The patient has no complaints of palpitations, irregular heat beats, chest pain, or chest pressure. Gastrointestinal: As above. Bowel movents seem normal. The patient has no complaints of excessive hunger, acid reflux, upset stomach, stomach aches or pains, diarrhea, or constipation. Legs: Muscle mass and strength seem normal. There are no complaints of numbness, tingling, burning, or pain. No edema is noted. Feet: There are no obvious foot problems. There are no complaints of numbness, tingling, burning, or pain. No edema is noted. GYN: LMP 2 weeks ago. Periods are regular on her oral contraceptives.   PAST MEDICAL, FAMILY, AND SOCIAL HISTORY:  Past Medical History  Diagnosis Date  . Anxiety     Counseling and psychiatry  . Chronic headaches     Sees specialist  . Thyroid disease   . History of lower GI bleeding   . PUD (peptic ulcer disease)   . IBS (irritable  bowel syndrome)   . PCOS (polycystic ovarian syndrome)   . Oligomenorrhea   . Goiter   . Hashimoto's thyroiditis   . Obesity   . Dyspepsia   . Hypothyroidism, acquired, autoimmune   . Fatigue   . History of shingles     face right   . History of recurrent UTIs     Sees urologist  . Hx of varicella     Family History  Problem Relation Age of Onset  . Breast cancer Maternal Grandmother   . Colon cancer Maternal Grandmother   . Colon polyps Maternal Grandmother   . Cancer Maternal Grandmother   . Diabetes Paternal Grandfather   . Lupus Paternal Grandmother   . Thyroid disease Paternal Grandmother     Hypothyroid without surgery or irradiation.  . Osteopenia Mother   . Obesity Mother     Current outpatient prescriptions:ALPRAZolam (XANAX XR) 0.5 MG 24 hr tablet, Take by mouth every morning., Disp: , Rfl: ;  cetirizine (ZYRTEC) 10 MG tablet, Take 10 mg by mouth daily.  , Disp: , Rfl: ;  desvenlafaxine (PRISTIQ) 50 MG 24 hr tablet, Take 50 mg by mouth daily., Disp: , Rfl: ;  dicyclomine (BENTYL) 10  MG capsule, Take 1 tablet by mouth every 4 hours as needed for pain., Disp: 120 capsule, Rfl: 0 loperamide (IMODIUM A-D) 2 MG tablet, Take as directed, Disp: 1 tablet, Rfl: 0;  Norethindrone-Ethinyl Estradiol-Fe (GENERESS FE) 0.8-25 MG-MCG tablet, Chew 1 tablet by mouth daily.  , Disp: , Rfl: ;  hydrocortisone (ANUSOL-HC) 25 MG suppository, Place 1 suppository (25 mg total) rectally at bedtime. When flaring, Disp: 12 suppository, Rfl: 1  Allergies as of 12/19/2012 - Review Complete 12/19/2012  Allergen Reaction Noted  . Nsaids  03/29/2011  . Penicillins  07/24/2011    1. Work and Family: She wants to go back to school at [employer] in the Fall. Her grandparents are ill and moved into her parents' home soon. She is still living in her own apartment. She is dating someone else now. Life is good.  2. Activities: Not much 3. Smoking, alcohol, or drugs: None 4. Primary Care Provider: Dr. Berniece Andreas, MD 5. Psychiatrist: Dr. Milagros Evener, MD - phone [phone], fax [phone] 6. GYN: Dr. Marcelle Overlie, MD  REVIEW OF SYSTEMS: There are no other significant problems involving Catherine [name]'s other body systems.   Objective:  Vital Signs:  BP 114/64  Pulse 78  Wt 172 lb 3.2 oz (78.109 kg)  BMI 29.11 kg/m2  She is on Sudafed today for her URI.    Ht Readings from Last 3 Encounters:  10/03/12 5' 4.5" (1.638 m)  05/31/12 5' 4.75" (1.645 m)  06/21/11 5\' 6"  (1.676 m)   Wt Readings from Last 3 Encounters:  12/19/12 172 lb 3.2 oz (78.109 kg)  10/03/12 169 lb (76.658 kg)  09/05/12 171 lb (77.565 kg)   There is no height on file to calculate BSA.  Normalized stature-for-age data available only for age 67 to 20 years. Normalized weight-for-age data available only for age 67 to 20 years.  PHYSICAL EXAM:  Constitutional: The patient is vibrant, very chatty, very upbeat and positive about how she feels and about her feeling that she is physically and emotionally ready to move forward with her life now.  Face: The face appears normal.  Eyes: There is no obvious arcus or proptosis. Moisture appears normal. Mouth: The oropharynx and tongue appear normal. Dentition appears to be normal for age. Oral moisture is normal. She has no hyperpigmentation of the oral mucosa. Neck: The neck appears to be visibly normal. No carotid bruits are noted. The thyroid gland is mildly enlarged at  about 22 grams in size. Her thyroid isthmus is relatively large. The consistency of the thyroid gland is normal. The thyroid gland is not tender to palpation today. Lungs: The lungs are clear to auscultation. Air movement is good. Heart: Heart rate and rhythm are regular. Heart sounds S1 and S2 are normal. I did not appreciate any pathologic cardiac murmurs. Abdomen: The abdomen is somewhat enlarged. Bowel sounds are normal. There is no obvious hepatomegaly, splenomegaly, or other mass effect.  Arms: Muscle size and  bulk are normal for age. Hands: There is no obvious tremor. Phalangeal and metacarpophalangeal joints are normal. Palmar muscles are normal. Palmar skin is normal. Palmar moisture is also normal. Nails are normally pink. There is no palmar hyperpigmentation.  Legs: Muscles appear normal for age. No edema is present. Neurologic: Strength is normal for age in both the upper and lower extremities. Muscle tone is normal. Sensation to touch is normal in both legs.   Skin: She is wearing makeup today.   LAB DATA:  10/23/12: TSH 0.812, free  T4 1.02, free T3 2.9; iron 114, 25-hydroxy vitamin D 20, calcium 9.5, PTH 40.6 07/04/12 at 9 AM: ACTH 24, cortisol 17.3 02/18/12: TSH 1.208, free T4, free T3 3.0  Bone mineral density 09/22/12: Osteopenia of the spine based upon T-score for age  Assessment and Plan:   ASSESSMENT:  1. Thyroiditis: The patient's Hashimoto's disease is clinically quiescent today. She was mid-range euthyroid in July. 2. Goiter: The thyroid gland is a bit larger today. The waxing and waning of thyroid gland size shows that she has more thyroiditis at times and less at other times.  3. Obesity: Her weight has increased somewhat after prednisone. She is eating healthier. She would also benefit from exercising daily.   4. Dyspepsia: She is doing better. 5. Fatigue: She is doing much better. She no longer feels fatigued. Her multivitamin and probiotic may be helping. 6. Anxiety and depression: She is doing wonderfully better. This is the best I've seen her in years.  7. Chronic/recurrent/frequent infectious/inflammatory disease: She has been free of these problems for the past several months since eliminating gluten and dairy. .  8. Multiple fractures: She has not had any further fractures since last visit. Her bone mineral density showed that hers spine is osteopenic. Her PTH in March was mid-range normal. Her calcium was at the 50%. Her vitamin D, however was low. She has since been taking  a vitamin D tablet in addition to her MVI. In order to maximize bone mineral density, we need to increase the calcium level to the upper quartile. Daily exercise will help her build bone.       PLAN:  1. Diagnostic:  The following blood tests will be drawn prior to next visit: 25-OH vitamin D, PTH, calcium,1,25-dihydroxy Vitamin D. 2. Therapeutic: Take one good MVI per day, either Centrum for Women or One-A-Day for Women. Continue the vitamin D tablet. Get an hour or more of exercise per day.  3. Patient education: We discussed osteopenia, osteoporosis, vitamin d deficiency, her chronic fatigue symptoms and her anxiety and depression.   4. Follow-up: 4 months  Level of Service: This visit lasted in excess of 60 minutes. More than 50% of the visit was devoted to counseling.  David Stall, MD 12/19/2012 11:03 AM

## 2012-12-19 NOTE — Patient Instructions (Signed)
Follow up visit in 4 months.  

## 2012-12-20 ENCOUNTER — Encounter: Payer: Self-pay | Admitting: "Endocrinology

## 2013-01-20 ENCOUNTER — Encounter (HOSPITAL_BASED_OUTPATIENT_CLINIC_OR_DEPARTMENT_OTHER): Payer: Self-pay | Admitting: *Deleted

## 2013-01-20 ENCOUNTER — Emergency Department (HOSPITAL_BASED_OUTPATIENT_CLINIC_OR_DEPARTMENT_OTHER)
Admission: EM | Admit: 2013-01-20 | Discharge: 2013-01-21 | Disposition: A | Discharge status: Home / Self Care | Payer: BC Managed Care – PPO | Attending: Emergency Medicine | Admitting: Emergency Medicine

## 2013-01-20 DIAGNOSIS — Z8619 Personal history of other infectious and parasitic diseases: Secondary | ICD-10-CM | POA: Insufficient documentation

## 2013-01-20 DIAGNOSIS — R0789 Other chest pain: Secondary | ICD-10-CM | POA: Insufficient documentation

## 2013-01-20 DIAGNOSIS — T4791XA Poisoning by unspecified agents primarily affecting the gastrointestinal system, accidental (unintentional), initial encounter: Secondary | ICD-10-CM | POA: Insufficient documentation

## 2013-01-20 DIAGNOSIS — R197 Diarrhea, unspecified: Secondary | ICD-10-CM | POA: Insufficient documentation

## 2013-01-20 DIAGNOSIS — Z880 Allergy status to penicillin: Secondary | ICD-10-CM | POA: Insufficient documentation

## 2013-01-20 DIAGNOSIS — Z8719 Personal history of other diseases of the digestive system: Secondary | ICD-10-CM | POA: Insufficient documentation

## 2013-01-20 DIAGNOSIS — Y9289 Other specified places as the place of occurrence of the external cause: Secondary | ICD-10-CM | POA: Insufficient documentation

## 2013-01-20 DIAGNOSIS — Z8639 Personal history of other endocrine, nutritional and metabolic disease: Secondary | ICD-10-CM | POA: Insufficient documentation

## 2013-01-20 DIAGNOSIS — R112 Nausea with vomiting, unspecified: Secondary | ICD-10-CM | POA: Insufficient documentation

## 2013-01-20 DIAGNOSIS — Z8742 Personal history of other diseases of the female genital tract: Secondary | ICD-10-CM | POA: Insufficient documentation

## 2013-01-20 DIAGNOSIS — Z91011 Allergy to milk products: Secondary | ICD-10-CM

## 2013-01-20 DIAGNOSIS — F410 Panic disorder [episodic paroxysmal anxiety] without agoraphobia: Secondary | ICD-10-CM | POA: Insufficient documentation

## 2013-01-20 DIAGNOSIS — E669 Obesity, unspecified: Secondary | ICD-10-CM | POA: Insufficient documentation

## 2013-01-20 DIAGNOSIS — Y9389 Activity, other specified: Secondary | ICD-10-CM | POA: Insufficient documentation

## 2013-01-20 DIAGNOSIS — R21 Rash and other nonspecific skin eruption: Secondary | ICD-10-CM | POA: Insufficient documentation

## 2013-01-20 DIAGNOSIS — Z87440 Personal history of urinary (tract) infections: Secondary | ICD-10-CM | POA: Insufficient documentation

## 2013-01-20 DIAGNOSIS — Z8679 Personal history of other diseases of the circulatory system: Secondary | ICD-10-CM | POA: Insufficient documentation

## 2013-01-20 DIAGNOSIS — Z862 Personal history of diseases of the blood and blood-forming organs and certain disorders involving the immune mechanism: Secondary | ICD-10-CM | POA: Insufficient documentation

## 2013-01-20 DIAGNOSIS — Z3202 Encounter for pregnancy test, result negative: Secondary | ICD-10-CM | POA: Insufficient documentation

## 2013-01-20 DIAGNOSIS — T471X1A Poisoning by other antacids and anti-gastric-secretion drugs, accidental (unintentional), initial encounter: Secondary | ICD-10-CM | POA: Insufficient documentation

## 2013-01-20 DIAGNOSIS — Z79899 Other long term (current) drug therapy: Secondary | ICD-10-CM | POA: Insufficient documentation

## 2013-01-20 DIAGNOSIS — F411 Generalized anxiety disorder: Secondary | ICD-10-CM | POA: Insufficient documentation

## 2013-01-20 MED ORDER — METHYLPREDNISOLONE SODIUM SUCC 125 MG IJ SOLR: 60.0000 mg | Freq: Once | INTRAMUSCULAR | Status: DC

## 2013-01-20 MED ORDER — DIPHENHYDRAMINE HCL 25 MG PO CAPS
50.0000 mg | ORAL_CAPSULE | Freq: Once | ORAL | Status: AC
  Administered 2013-01-21: 50 mg via ORAL
  Filled 2013-01-20: qty 2

## 2013-01-20 MED ORDER — METHYLPREDNISOLONE SODIUM SUCC 125 MG IJ SOLR
80.0000 mg | Freq: Once | INTRAMUSCULAR | Status: AC
  Administered 2013-01-21: 80 mg via INTRAVENOUS
  Filled 2013-01-20: qty 2

## 2013-01-20 MED ORDER — ALUM & MAG HYDROXIDE-SIMETH 200-200-20 MG/5ML PO SUSP
30.0000 mL | Freq: Once | ORAL | Status: AC
  Administered 2013-01-21: 30 mL via ORAL
  Filled 2013-01-20: qty 30

## 2013-01-20 MED ORDER — SODIUM CHLORIDE 0.9 % IV BOLUS (SEPSIS)
1000.0000 mL | Freq: Once | INTRAVENOUS | Status: AC
  Administered 2013-01-21: 1000 mL via INTRAVENOUS

## 2013-01-20 MED ORDER — PROMETHAZINE HCL 25 MG/ML IJ SOLN
25.0000 mg | Freq: Once | INTRAMUSCULAR | Status: AC
  Administered 2013-01-21: 25 mg via INTRAVENOUS
  Filled 2013-01-20: qty 1

## 2013-01-20 MED ORDER — LORAZEPAM 2 MG/ML IJ SOLN
1.0000 mg | Freq: Once | INTRAMUSCULAR | Status: AC
  Administered 2013-01-21: 1 mg via INTRAVENOUS
  Filled 2013-01-20: qty 1

## 2013-01-20 MED ORDER — METHYLPREDNISOLONE SODIUM SUCC 125 MG IJ SOLR: 125.0000 mg | Freq: Once | INTRAMUSCULAR | Status: DC

## 2013-01-20 NOTE — ED Notes (Signed)
Pt states she is allergic to dairy products and ?ingested some last p.m. Vomiting and hyperventilating at triage. Taken to ED9. Unable to take meds.

## 2013-01-20 NOTE — ED Provider Notes (Signed)
History     This chart was scribed for Jones Skene, MD by Jiles Prows, ED Scribe. The patient was seen in room MH09/MH09 and the patient's care was started at 11:48 PM.  CSN: [id number]  Arrival date & time 01/20/13  2326  Chief Complaint  Patient presents with  . Allergic Reaction    The history is provided by the patient, a relative and medical records. No language interpreter was used.   HPI Comments: Catherine [name] is a 22 y.o. Female with a history of PUD and IBS who presents to the Emergency Department complaining of moderate to severe nausea, vomiting (~20 episodes today), and watery diarrhea (2 episodes today) that began last night after eating out at a restaurant.  She reports a milk allergy and suspects that her food last night contained milk.  Pt reports chest tightness and chest cramping.  Mother reports that she has been unable to take anxiety medication due to constant vomiting, and that she has had multiple panic attacks today.  Pt reports she took dissolvable xanax a few hours ago during a panic attack. Pt reports a rash is starting to show on her arms, but it is not severe. Pt denies hematochezia, headache, diaphoresis, fever, chills, cough, SOB and any other pain.     Past Medical History  Diagnosis Date  . Anxiety     Counseling and psychiatry  . Chronic headaches     Sees specialist  . Thyroid disease   . History of lower GI bleeding   . PUD (peptic ulcer disease)   . IBS (irritable bowel syndrome)   . PCOS (polycystic ovarian syndrome)   . Oligomenorrhea   . Goiter   . Hashimoto's thyroiditis   . Obesity   . Dyspepsia   . Hypothyroidism, acquired, autoimmune   . Fatigue   . History of shingles     face right   . History of recurrent UTIs     Sees urologist  . Hx of varicella     Past Surgical History  Procedure Laterality Date  . Knee surgery      x 2 femoral.  break  sp fall  7th grade   . Band hemorrhoidectomy    . Wisdom tooth extraction    .  Knees      Family History  Problem Relation Age of Onset  . Breast cancer Maternal Grandmother   . Colon cancer Maternal Grandmother   . Colon polyps Maternal Grandmother   . Cancer Maternal Grandmother   . Diabetes Paternal Grandfather   . Lupus Paternal Grandmother   . Thyroid disease Paternal Grandmother     Hypothyroid without surgery or irradiation.  . Osteopenia Mother   . Obesity Mother     History  Substance Use Topics  . Smoking status: Never Smoker   . Smokeless tobacco: Never Used  . Alcohol Use: No    OB History   Grav Para Term Preterm Abortions TAB SAB Ect Mult Living                  Review of Systems At least 10pt or greater review of systems completed and are negative except where specified in the HPI.  Allergies  Milk-related compounds; Nsaids; and Penicillins  Home Medications   Current Outpatient Rx  Name  Route  Sig  Dispense  Refill  . ALPRAZolam (XANAX XR) 0.5 MG 24 hr tablet   Oral   Take by mouth every morning.         [name]  cetirizine (ZYRTEC) 10 MG tablet   Oral   Take 10 mg by mouth daily.           [name] desvenlafaxine (PRISTIQ) 50 MG 24 hr tablet   Oral   Take 50 mg by mouth daily.         [name] dicyclomine (BENTYL) 10 MG capsule      Take 1 tablet by mouth every 4 hours as needed for pain.   120 capsule   0   . hydrocortisone (ANUSOL-HC) 25 MG suppository   Rectal   Place 1 suppository (25 mg total) rectally at bedtime. When flaring   12 suppository   1   . loperamide (IMODIUM A-D) 2 MG tablet      Take as directed   1 tablet   0   . Norethindrone-Ethinyl Estradiol-Fe (GENERESS FE) 0.8-25 MG-MCG tablet   Oral   Chew 1 tablet by mouth daily.             BP 111/87  Pulse 124  Temp(Src) 98.3 F (36.8 C) (Oral)  Resp 32  Ht 5\' 6"  (1.676 m)  Wt 170 lb (77.111 kg)  BMI 27.45 kg/m2  SpO2 100%  LMP 01/06/2013  Physical Exam  Nursing notes reviewed.  Electronic medical record reviewed. VITAL SIGNS:   Filed  Vitals:   01/20/13 2334 01/21/13 0123  BP: 111/87 101/59  Pulse: 124 85  Temp: 98.3 F (36.8 C)   TempSrc: Oral   Resp: 32 16  Height: 5\' 6"  (1.676 m)   Weight: 170 lb (77.111 kg)   SpO2: 100% 100%   CONSTITUTIONAL: Awake, oriented, appears non-toxic HENT: Atraumatic, normocephalic, oral mucosa pink and moist, airway patent. Nares patent without drainage. External ears normal. EYES: Conjunctiva clear, EOMI, PERRLA NECK: Trachea midline, non-tender, supple CARDIOVASCULAR: Tachycardic, Normal rhythm, No murmurs, rubs, gallops PULMONARY/CHEST: Clear to auscultation, no rhonchi, wheezes, or rales. Symmetrical breath sounds. Non-tender. ABDOMINAL: Non-distended, soft, diffusely tender to palpation without rebound or guarding.  BS increased. NEUROLOGIC: Non-focal, moving all four extremities, no gross sensory or motor deficits. EXTREMITIES: No clubbing, cyanosis, or edema SKIN: Warm, Dry, No erythema, No rash  ED Course  Procedures (including critical care time) DIAGNOSTIC STUDIES: Oxygen Saturation is 100% on RA, normal by my interpretation.    COORDINATION OF CARE: 11:56 PM - Discussed ED treatment with pt at bedside including steroids and pt agrees.     Labs Reviewed  CBC WITH DIFFERENTIAL - Abnormal; Notable for the following:    WBC 13.5 (*)    Neutro Abs 9.8 (*)    All other components within normal limits  URINALYSIS, ROUTINE W REFLEX MICROSCOPIC - Abnormal; Notable for the following:    Color, Urine AMBER (*)    APPearance CLOUDY (*)    Specific Gravity, Urine 1.034 (*)    Hgb urine dipstick SMALL (*)    Bilirubin Urine SMALL (*)    Ketones, ur >80 (*)    Protein, ur 30 (*)    Leukocytes, UA SMALL (*)    All other components within normal limits  URINE MICROSCOPIC-ADD ON - Abnormal; Notable for the following:    Bacteria, UA FEW (*)    All other components within normal limits  URINE CULTURE  COMPREHENSIVE METABOLIC PANEL  PREGNANCY, URINE   No results  found.   1. 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 Patient has been on in the limitation  diet and thinks that milk products cause her to have allergy symptoms. Patient also has history of anxiety which I think is driving part of the patient's symptoms. Patient has no wheezing, no skin findings. She has some abdominal cramping, loose stools x2 nausea and vomiting today, patient is mildly dehydrated on physical exam and by urinalysis. She is given some fluids by IV, antiemetics, dose of steroids and Benadryl. I do not think the patient requires epinephrine at this time, I do not think the patient is having anaphylactic reaction.  Patient we discharged with an EpiPen, this is based on patient request and a long discussion at the bedside of how to properly use this, signs and symptoms consistent with anaphylaxis including difficulty breathing, swelling in the mouth or throat, hives, severe abdominal pain, vomiting, associated with dizziness lightheadedness or syncope. Patient does have followup with an allergist.  At the time of discharge, patient is feeling much better, she's no longer nauseous, she appears in no acute distress, vital signs are stable and within normal limits.  I explained the diagnosis and have given explicit precautions to return to the ER including recurrent symptoms, difficulty breathing or any other new or worsening symptoms. The patient understands and accepts the medical plan as it's been dictated and I have answered their questions. Discharge instructions concerning home care and prescriptions have been given.  The patient is STABLE and is discharged to home in good condition.   I personally performed the services described in this documentation, which was scribed in my presence. The recorded information has been reviewed and is accurate. Jones Skene, M.D.      Jones Skene, MD 01/21/13 1610

## 2013-01-21 LAB — COMPREHENSIVE METABOLIC PANEL
ALT: 16 U/L (ref 0–35)
AST: 19 U/L (ref 0–37)
Albumin: 3.9 g/dL (ref 3.5–5.2)
Alkaline Phosphatase: 73 U/L (ref 39–117)
BUN: 12 mg/dL (ref 6–23)
CO2: 23 mEq/L (ref 19–32)
Calcium: 9.7 mg/dL (ref 8.4–10.5)
Chloride: 99 mEq/L (ref 96–112)
Creatinine, Ser: 0.7 mg/dL (ref 0.50–1.10)
GFR calc Af Amer: >90 mL/min (ref >90)
GFR calc non Af Amer: >90 mL/min (ref >90)
Sodium: 136 mEq/L (ref 135–145)
Total Bilirubin: 0.6 mg/dL (ref 0.3–1.2)

## 2013-01-21 LAB — CBC WITH DIFFERENTIAL/PLATELET
Basophils Absolute: 0 10*3/uL (ref 0.0–0.1)
Basophils Relative: 0 % (ref 0–1)
Eosinophils Relative: 0 % (ref 0–5)
HCT: 40.8 % (ref 36.0–46.0)
Lymphocytes Relative: 22 % (ref 12–46)
Lymphs Abs: 2.9 10*3/uL (ref 0.7–4.0)
Monocytes Absolute: 0.8 10*3/uL (ref 0.1–1.0)
Neutro Abs: 9.8 10*3/uL — ABNORMAL HIGH (ref 1.7–7.7)
Neutrophils Relative %: 72 % (ref 43–77)
Platelets: 311 10*3/uL (ref 150–400)
RDW: 12.8 % (ref 11.5–15.5)
WBC: 13.5 10*3/uL — ABNORMAL HIGH (ref 4.0–10.5)

## 2013-01-21 LAB — URINE MICROSCOPIC-ADD ON

## 2013-01-21 LAB — URINALYSIS, ROUTINE W REFLEX MICROSCOPIC
Glucose, UA: NEGATIVE mg/dL
Protein, ur: 30 mg/dL — AB
Specific Gravity, Urine: 1.034 — ABNORMAL HIGH (ref 1.005–1.030)
Urobilinogen, UA: 0.2 mg/dL (ref 0.0–1.0)

## 2013-01-21 LAB — PREGNANCY, URINE: Preg Test, Ur: NEGATIVE

## 2013-01-21 MED ORDER — EPINEPHRINE 0.3 MG/0.3ML IJ SOAJ: 0.3000 mg | INTRAMUSCULAR | Status: DC | PRN

## 2013-01-21 MED ORDER — PREDNISONE 10 MG PO TABS: 20.0000 mg | ORAL_TABLET | Freq: Every day | ORAL | Status: DC

## 2013-01-21 MED ORDER — SODIUM CHLORIDE 0.9 % IV BOLUS (SEPSIS)
1000.0000 mL | Freq: Once | INTRAVENOUS | Status: AC
  Administered 2013-01-21: 1000 mL via INTRAVENOUS

## 2013-01-21 MED ORDER — PROMETHAZINE HCL 25 MG PO TABS: 25.0000 mg | ORAL_TABLET | Freq: Four times a day (QID) | ORAL | Status: DC | PRN

## 2013-01-21 MED ORDER — DIPHENHYDRAMINE HCL 25 MG PO CAPS: 25.0000 mg | ORAL_CAPSULE | Freq: Four times a day (QID) | ORAL | Status: DC | PRN

## 2013-01-21 NOTE — ED Notes (Signed)
MD at bedside. 

## 2013-01-22 LAB — URINE CULTURE

## 2013-02-23 ENCOUNTER — Telehealth: Payer: Self-pay | Admitting: Neurology

## 2013-02-26 NOTE — Telephone Encounter (Signed)
This pt last seen 03-10-12.   LMVM for pt to return call about letter requested.

## 2013-02-28 NOTE — Telephone Encounter (Signed)
I called and spoke to mother of pt and relayed that I have her letter.  Will ask Dr. Vickey Huger re: doing this.  Will mail when ready.

## 2013-03-14 ENCOUNTER — Encounter: Payer: Self-pay | Admitting: Internal Medicine

## 2013-03-19 ENCOUNTER — Encounter: Payer: Self-pay | Admitting: Neurology

## 2013-04-06 ENCOUNTER — Other Ambulatory Visit: Payer: Self-pay | Admitting: *Deleted

## 2013-04-06 DIAGNOSIS — E063 Autoimmune thyroiditis: Secondary | ICD-10-CM

## 2013-04-17 ENCOUNTER — Ambulatory Visit (INDEPENDENT_AMBULATORY_CARE_PROVIDER_SITE_OTHER): Payer: BC Managed Care – PPO | Admitting: Internal Medicine

## 2013-04-17 ENCOUNTER — Encounter: Payer: Self-pay | Admitting: Internal Medicine

## 2013-04-17 VITALS — BP 112/70 | HR 106 | Temp 98.4°F | Wt 168.0 lb

## 2013-04-17 DIAGNOSIS — J0300 Acute streptococcal tonsillitis, unspecified: Secondary | ICD-10-CM

## 2013-04-17 DIAGNOSIS — B009 Herpesviral infection, unspecified: Secondary | ICD-10-CM

## 2013-04-17 DIAGNOSIS — B001 Herpesviral vesicular dermatitis: Secondary | ICD-10-CM | POA: Insufficient documentation

## 2013-04-17 DIAGNOSIS — J020 Streptococcal pharyngitis: Secondary | ICD-10-CM

## 2013-04-17 DIAGNOSIS — J039 Acute tonsillitis, unspecified: Secondary | ICD-10-CM

## 2013-04-17 DIAGNOSIS — J351 Hypertrophy of tonsils: Secondary | ICD-10-CM

## 2013-04-17 MED ORDER — CEPHALEXIN 500 MG PO CAPS: 500.0000 mg | ORAL_CAPSULE | Freq: Three times a day (TID) | ORAL | Status: DC

## 2013-04-17 MED ORDER — VALACYCLOVIR HCL 1 G PO TABS: ORAL_TABLET | ORAL | Status: DC

## 2013-04-17 NOTE — Progress Notes (Signed)
Chief Complaint  Patient presents with  . Fever    Started on Sunday  . Emesis  . Sore Throat  . Fatigue  . Generalized Body Aches  . Nasal Congestion    HPI: Patient comes in today for SDA for  new problem evaluation. Comes in with mother today because of her new illness.  Patient comes on with 2 days of acute illness began as vomiting body aches and fever 201 and then developed a very bad sore throat difficult to swallow with some upper nasal congestion. She has been unable to keep solid foods down has generalized body aches no unusual rashes except a cold sore area that is, it on her left lower lip and is a little more pronounced than her usual cold sores using a topical lysine . She's had fever blisters before and taking Valtrex but has none at this time.  He is just started a new semester and had a tests today which was difficult for her to keep up with ;needs a note for class.  ROS: See pertinent positives and negatives per HPI. Temperature 101 states that her tonsils have bled on the left side at times she tends to get sore throats quite a bit and acute illnesses on top does not always go to the physician for this problem.  Remote history of recurrent tonsil problem with stones that eventually went away he was going to see ENT but had improved. No documented mono but testing wasn't really done. Has been exposed in the remote past.  Her past history of sensitivity medicines as significant although her listing of penicillin as an allergy was mostly because of the father's history of penicillin allergy and she may have had a minor rash at some point. Review of her record shows that she has been able to take cephalexin in the past and did not have an allergic reaction to it  She gets significant GI disturbance with antibiotics takes a probiotic with help  Past Medical History  Diagnosis Date  . Anxiety     Counseling and psychiatry  . Chronic headaches     Sees specialist  .  Thyroid disease   . History of lower GI bleeding   . PUD (peptic ulcer disease)   . IBS (irritable bowel syndrome)   . PCOS (polycystic ovarian syndrome)   . Oligomenorrhea   . Goiter   . Hashimoto's thyroiditis   . Obesity   . Dyspepsia   . Hypothyroidism, acquired, autoimmune   . Fatigue   . History of shingles     face right   . History of recurrent UTIs     Sees urologist  . Hx of varicella     Family History  Problem Relation Age of Onset  . Breast cancer Maternal Grandmother   . Colon cancer Maternal Grandmother   . Colon polyps Maternal Grandmother   . Cancer Maternal Grandmother   . Diabetes Paternal Grandfather   . Lupus Paternal Grandmother   . Thyroid disease Paternal Grandmother     Hypothyroid without surgery or irradiation.  . Osteopenia Mother   . Obesity Mother     History   Social History  . Marital Status: Single    Spouse Name: N/A    Number of Children: 0  . Years of Education: N/A   Occupational History  . student    Social History Main Topics  . Smoking status: Never Smoker   . Smokeless tobacco: Never Used  . Alcohol Use:  No  . Drug Use: No  . Sexual Activity: Yes    Birth Control/ Protection: Pill   Other Topics Concern  . None   Social History Narrative   Lives by herself with 5 cats near [facility] [address] apartment. 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 Will be difficult to go back financially.    Outpatient Encounter Prescriptions as of 04/17/2013  Medication Sig Dispense Refill  . ALPRAZolam (XANAX XR) 0.5 MG 24 hr tablet Take by mouth every morning.      . cetirizine (ZYRTEC) 10 MG tablet Take 10 mg by mouth daily.        [name] desvenlafaxine (PRISTIQ) 50 MG 24 hr tablet Take 50 mg by mouth daily.      . diphenhydrAMINE (BENADRYL) 25 mg capsule Take 1-2 capsules (25-50 mg  total) by mouth every 6 (six) hours as needed for itching or allergies.  30 capsule  0  . EPINEPHrine (EPIPEN) 0.3 mg/0.3 mL DEVI Inject 0.3 mLs (0.3 mg total) into the muscle as needed.  2 Device  0  . loperamide (IMODIUM A-D) 2 MG tablet Take as directed  1 tablet  0  . Norethindrone-Ethinyl Estradiol-Fe (GENERESS FE) 0.8-25 MG-MCG tablet Chew 1 tablet by mouth daily.        . promethazine (PHENERGAN) 25 MG tablet Take 1 tablet (25 mg total) by mouth every 6 (six) hours as needed for nausea.  30 tablet  0  . cephALEXin (KEFLEX) 500 MG capsule Take 1 capsule (500 mg total) by mouth 3 (three) times daily.  30 capsule  0  . valACYclovir (VALTREX) 1000 MG tablet Take 2 po bid for fever blisters  30 tablet  1  . [DISCONTINUED] dicyclomine (BENTYL) 10 MG capsule Take 1 tablet by mouth every 4 hours as needed for pain.  120 capsule  0  . [DISCONTINUED] hydrocortisone (ANUSOL-HC) 25 MG suppository Place 1 suppository (25 mg total) rectally at bedtime. When flaring  12 suppository  1  . [DISCONTINUED] predniSONE (DELTASONE) 10 MG tablet Take 2 tablets (20 mg total) by mouth daily.  4 tablet  0   No facility-administered encounter medications on file as of 04/17/2013.    EXAM:  BP 112/70  Pulse 106  Temp(Src) 98.4 F (36.9 C) (Oral)  Wt 168 lb (76.204 kg)  BMI 27.13 kg/m2  SpO2 97%  LMP 03/25/2013  Body mass index is 27.13 kg/(m^2). Well-developed well-nourished in no acute distress looks like she feels badly speech is normal. Prefers not to talk and have her mother do the talking HEENT normocephalic TMs clear with normal landmarks nares minimal congestion eyes clear to P. +2 to +3 tonsils hypertrophied posterior redness I don't seen exudate but there is some polypoid-type tissue coming out on the left tonsil. Airway is patent. No palatal petechiae.  Left lower lip a large cold sore outbreak. Neck supple +1 tender a.c. nodes negative PC but shoddy Chest clear to auscultation cardiac S1-S2 no  gallops murmurs abdomen soft and academically guarding or rebounding or flank pain. Skin no acute rashes normal turgor. For the fever blisters at the left lower lip and face no impetigo seen Abdomen soft without organomegaly guarding or rebound ASSESSMENT AND PLAN:  Discussed the following assessment and plan:  Acute streptococcal tonsillitis - Plan: Ambulatory referral to ENT  Acute tonsillitis - Plan: POCT rapid strep A, CANCELED: Culture, Group A Strep, CANCELED: CBC with Differential, CANCELED: Epstein-Barr virus VCA antibody panel  Chronic tonsillar hypertrophy - Plan: Ambulatory referral to ENT  Herpes simplex labialis Appears to be acute on chronic hypertrophied tonsils doesn't do very well with antibiotics with risk of side effects we'll do rapid strep and culture blood count if rapid is negative make decisions about adding antibiotics and symptomatic treatment. Antiemetics as needed. This is to help with fluids. Rapid strep was positive for group A strep reviewed record use cephalexin 500 mg 3 times a day and can decrease to twice a day after improvement. Patient is interested in your nose and throat consult and I agree even though she does not have documented acute tonsillitis in the doctor's office by history has had some chronic sore throats with adenopathy probably adding negatively to her general health and visits required to the doctor. She is allergic to penicillin and milk products. -Patient advised to return or notify health care team  if symptoms worsen or persist or new concerns arise.  Patient Instructions  You have strep tonsillitis . Take antibiotic .  For strep  Expect  To  Improve in 48 hours or less.  Valtrex can help fever blisters when given early  .    Ok to do ent referral. For possible chronic recurrent tonsillitis.  Gargles and fluids.       Strep Throat Strep throat is an infection of the throat caused by a bacteria named Streptococcus pyogenes. Your  caregiver may call the infection streptococcal "tonsillitis" or "pharyngitis" depending on whether there are signs of inflammation in the tonsils or back of the throat. Strep throat is most common in children aged 5 15 years during the cold months of the year, but it can occur in people of any age during any season. This infection is spread from person to person (contagious) through coughing, sneezing, or other close contact. SYMPTOMS   Fever or chills.  Painful, swollen, red tonsils or throat.  Pain or difficulty when swallowing.  White or yellow spots on the tonsils or throat.  Swollen, tender lymph nodes or "glands" of the neck or under the jaw.  Red rash all over the body (rare). DIAGNOSIS  Many different infections can cause the same symptoms. A test must be done to confirm the diagnosis so the right treatment can be given. A "rapid strep test" can help your caregiver make the diagnosis in a few minutes. If this test is not available, a light swab of the infected area can be used for a throat culture test. If a throat culture test is done, results are usually available in a day or two. TREATMENT  Strep throat is treated with antibiotic medicine. HOME CARE INSTRUCTIONS   Gargle with 1 tsp of salt in 1 cup of warm water, 3 4 times per day or as needed for comfort.  Family members who also have a sore throat or fever should be tested for strep throat and treated with antibiotics if they have the strep infection.  Make sure everyone in your household washes their hands well.  Do not share food, drinking cups, or personal items that could cause the infection to spread to others.  You may need to eat a soft food diet until your sore throat gets better.  Drink enough water and fluids to keep your urine clear or pale yellow. This will help prevent dehydration.  Get plenty of rest.  Stay home from school, daycare, or work until you have been on antibiotics for 24 hours.  Only take  over-the-counter or prescription medicines for pain, discomfort, or fever as directed by your caregiver.  If antibiotics are prescribed, take them as directed. Finish them even if you start to feel better. SEEK MEDICAL CARE IF:   The glands in your neck continue to enlarge.  You develop a rash, cough, or earache.  You cough up green, yellow-brown, or bloody sputum.  You have pain or discomfort not controlled by medicines.  Your problems seem to be getting worse rather than better. SEEK IMMEDIATE MEDICAL CARE IF:   You develop any new symptoms such as vomiting, severe headache, stiff or painful neck, chest pain, shortness of breath, or trouble swallowing.  You develop severe throat pain, drooling, or changes in your voice.  You develop swelling of the neck, or the skin on the neck becomes red and tender.  You have a fever.  You develop signs of dehydration, such as fatigue, dry mouth, and decreased urination.  You become increasingly sleepy, or you cannot wake up completely. Document Released: 07/30/2000 Document Revised: 07/19/2012 Document Reviewed: 10/01/2010 [facility] Patient Information 2014 [place], [place].      [name]. Panosh M.D.

## 2013-04-17 NOTE — Patient Instructions (Addendum)
You have strep tonsillitis . Take antibiotic .  For strep  Expect  To  Improve in 48 hours or less.  Valtrex can help fever blisters when given early  .    Ok to do ent referral. For possible chronic recurrent tonsillitis.  Gargles and fluids.       Strep Throat Strep throat is an infection of the throat caused by a bacteria named Streptococcus pyogenes. Your caregiver may call the infection streptococcal "tonsillitis" or "pharyngitis" depending on whether there are signs of inflammation in the tonsils or back of the throat. Strep throat is most common in children aged 5 15 years during the cold months of the year, but it can occur in people of any age during any season. This infection is spread from person to person (contagious) through coughing, sneezing, or other close contact. SYMPTOMS   Fever or chills.  Painful, swollen, red tonsils or throat.  Pain or difficulty when swallowing.  White or yellow spots on the tonsils or throat.  Swollen, tender lymph nodes or "glands" of the neck or under the jaw.  Red rash all over the body (rare). DIAGNOSIS  Many different infections can cause the same symptoms. A test must be done to confirm the diagnosis so the right treatment can be given. A "rapid strep test" can help your caregiver make the diagnosis in a few minutes. If this test is not available, a light swab of the infected area can be used for a throat culture test. If a throat culture test is done, results are usually available in a day or two. TREATMENT  Strep throat is treated with antibiotic medicine. HOME CARE INSTRUCTIONS   Gargle with 1 tsp of salt in 1 cup of warm water, 3 4 times per day or as needed for comfort.  Family members who also have a sore throat or fever should be tested for strep throat and treated with antibiotics if they have the strep infection.  Make sure everyone in your household washes their hands well.  Do not share food, drinking cups, or personal  items that could cause the infection to spread to others.  You may need to eat a soft food diet until your sore throat gets better.  Drink enough water and fluids to keep your urine clear or pale yellow. This will help prevent dehydration.  Get plenty of rest.  Stay home from school, daycare, or work until you have been on antibiotics for 24 hours.  Only take over-the-counter or prescription medicines for pain, discomfort, or fever as directed by your caregiver.  If antibiotics are prescribed, take them as directed. Finish them even if you start to feel better. SEEK MEDICAL CARE IF:   The glands in your neck continue to enlarge.  You develop a rash, cough, or earache.  You cough up green, yellow-brown, or bloody sputum.  You have pain or discomfort not controlled by medicines.  Your problems seem to be getting worse rather than better. SEEK IMMEDIATE MEDICAL CARE IF:   You develop any new symptoms such as vomiting, severe headache, stiff or painful neck, chest pain, shortness of breath, or trouble swallowing.  You develop severe throat pain, drooling, or changes in your voice.  You develop swelling of the neck, or the skin on the neck becomes red and tender.  You have a fever.  You develop signs of dehydration, such as fatigue, dry mouth, and decreased urination.  You become increasingly sleepy, or you cannot wake up completely.  Document Released: 07/30/2000 Document Revised: 07/19/2012 Document Reviewed: 10/01/2010 [facility] Patient Information 2014 [place], [place].

## 2013-04-23 ENCOUNTER — Ambulatory Visit: Payer: BC Managed Care – PPO | Admitting: "Endocrinology

## 2013-06-21 ENCOUNTER — Other Ambulatory Visit: Payer: Self-pay

## 2013-07-10 LAB — VITAMIN D 25 HYDROXY (VIT D DEFICIENCY, FRACTURES): Vit D, 25-Hydroxy: 31 ng/mL (ref 30–89)

## 2013-07-10 LAB — PTH, INTACT AND CALCIUM: PTH: 64.3 pg/mL (ref 14.0–72.0)

## 2013-07-11 ENCOUNTER — Ambulatory Visit (INDEPENDENT_AMBULATORY_CARE_PROVIDER_SITE_OTHER): Payer: BC Managed Care – PPO | Admitting: "Endocrinology

## 2013-07-11 ENCOUNTER — Encounter: Payer: Self-pay | Admitting: "Endocrinology

## 2013-07-11 VITALS — BP 117/77 | HR 89 | Wt 164.5 lb

## 2013-07-11 DIAGNOSIS — T148XXA Other injury of unspecified body region, initial encounter: Secondary | ICD-10-CM

## 2013-07-11 DIAGNOSIS — E669 Obesity, unspecified: Secondary | ICD-10-CM

## 2013-07-11 DIAGNOSIS — F32A Depression, unspecified: Secondary | ICD-10-CM

## 2013-07-11 DIAGNOSIS — E063 Autoimmune thyroiditis: Secondary | ICD-10-CM

## 2013-07-11 DIAGNOSIS — T07XXXA Unspecified multiple injuries, initial encounter: Secondary | ICD-10-CM

## 2013-07-11 DIAGNOSIS — R5381 Other malaise: Secondary | ICD-10-CM

## 2013-07-11 DIAGNOSIS — R1013 Epigastric pain: Secondary | ICD-10-CM

## 2013-07-11 DIAGNOSIS — F341 Dysthymic disorder: Secondary | ICD-10-CM

## 2013-07-11 DIAGNOSIS — E049 Nontoxic goiter, unspecified: Secondary | ICD-10-CM

## 2013-07-11 DIAGNOSIS — K3189 Other diseases of stomach and duodenum: Secondary | ICD-10-CM

## 2013-07-11 DIAGNOSIS — F329 Major depressive disorder, single episode, unspecified: Secondary | ICD-10-CM

## 2013-07-11 DIAGNOSIS — F419 Anxiety disorder, unspecified: Secondary | ICD-10-CM

## 2013-07-11 NOTE — Patient Instructions (Signed)
Follow up visit in 5 months. Try to fit in an hour of exercise every day.

## 2013-07-11 NOTE — Progress Notes (Signed)
Subjective:  Patient Name: Catherine [name] Date of Birth: 25-Sep-1990  MRN: [id number]  [name]  presents to the office today for follow-up of her oligomenorrhea, PCOS, goiter, Hashimoto's thyroiditis, obesity, nausea and vomiting, dyspepsia, hypothyroidism, fatigue, anxiety and depression.[name]   HISTORY OF PRESENT ILLNESS:   Catherine [name] is a 22 y.o. Caucasian young woman.  Catherine [name] was accompanied by her mother.  1. The patient was first referred to me on 09/09/06 by her gynecologist, Dr. Marcelle Overlie, for evaluation and management of secondary amenorrhea. She was then 22 years old.  A. Patient had been essentially healthy up through age 62-13, when she underwent menarche. Her menstrual cycles have never been regular. Her last normal menstrual period occurred in about 2005. She was not on any oral contraceptives  or contraceptive implants at that time. Another obstetrician put her on oral contraceptives at that point. She gained about 20 pounds in weight in that first year on oral contraceptives. She started evaluation with Dr. Vincente Poli in 2007 or so. Dr. Vincente Poli put her on Yaz initially, but later converted her to [facility].The patient's past medical history was positive for a lot of depression, mood swings, and panic attacks. She also had 2 knee surgeries in the seventh grade. She did not do any type of routine physical activity. Family history was positive for diabetes in the paternal grandfather and hypothyroidism in the paternal grandmother.  Father had severe depression and anxiety. Maternal grandmother had colon cancer. Mother was obese. Mother also had endometriosis. As I later learned on 07/10/12, one of Catherine [name]'s mother's first cousins had recently developed Addison's Disease.  B. On physical examination, her height was at the 35th percentile and her weight at 177.6 pounds was at the 93rd percentile. Her BMI was 30.7. She had a 25+ gram thyroid gland. She was tender in the right lobe. She also had 1+  acanthosis nigricans. Previous laboratory data from 06/21/06 showed a normal CMP. Her TSH was 2.05 and her free T4 was 1.18. A serum glucose was 91 with a corresponding insulin level of 95, which was very excessive. Laboratory data at our visit included a blood glucose of 79 and hemoglobin A1c of 5.0%. Subsequent lab tests on 09/12/06 showed a fasting glucose of 72 and a fasting insulin of 22, again excessive. It appeared at that time that the patient was mildly obese. Her level of obesity was high enough, however, to cause a significant degree of insulin resistance and hyperinsulinemia. Her level of obesity may also have been high enough to cause oligomenorrhea. She had a goiter and clinical evidence of Hashimoto's disease, but she was euthyroid. I talked with the patient and her mother about our Eat Right Diet plan. I also talked about exercising 45-60 minutes a day. I started her on metformin, 500 mg, twice daily.  2. During the past 6 years, patient's weight has fluctuated up and down, varying between 150.7-179.4 lbs. She has continued to have flare ups of Hashimoto's disease. On 02/12/08 her TSH rose to 4.493, but then subsequently normalized. She has been euthyroid for most of the past 5 years. At times she's been on birth control patches, but most times has been on birth control pills. Her dyspepsia is better when she takes her Nexium and worse when she does not. Her anxiety and depression have been major problems for her. She has also had chronic fatigue, recurrent UTIs, and a myriad of gastrointestinal symptoms.   3. The patient's last [facility] visit was on 12/19/12. In the interim  she has been healthy. She feels" fantastic, very happy, and positive". Her diet is still 100% dairy-free, but she does eat some gluten-containing foods. She has not had any more gout, UTIs, or migraines since the Topamax was discontinued.  She has been doing pretty well emotionally, but still has occasional, mild panic attacks. She  feels that Pristiq is working well. She is also taking Generess Fe OCPs.  She takes her multivitamin every day. She walks a lot on campus, but does not do any other physical activity. She has not had any more fractures. She will have a tonsillectomy in December.  4. Pertinent Review of Systems:  Constitutional: The patient feels "great".  Her energy level is really good. "I feel like an entirely different person." "My medications are working very well for me."    Eyes: Vision is good. There are no significant eye complaints. Neck: The patient has no complaints of anterior neck swelling, soreness, tenderness,  pressure, discomfort, or difficulty swallowing.  Heart: Heart rate increases with exercise or other physical activity. The patient has no complaints of palpitations, irregular heat beats, chest pain, or chest pressure. Gastrointestinal: "My stomach is better than I ever thought it could feel." Bowel movents seem normal. The patient has no complaints of excessive hunger, acid reflux, upset stomach, stomach aches or pains, diarrhea, or constipation. Legs: Muscle mass and strength seem normal. There are no complaints of numbness, tingling, burning, or pain. No edema is noted. Feet: There are no obvious foot problems. There are no complaints of numbness, tingling, burning, or pain. No edema is noted. GYN: LMP 2-3 weeks ago. Periods are regular on her oral contraceptives.   PAST MEDICAL, FAMILY, AND SOCIAL HISTORY:  Past Medical History  Diagnosis Date  . Anxiety     Counseling and psychiatry  . Chronic headaches     Sees specialist  . Thyroid disease   . History of lower GI bleeding   . PUD (peptic ulcer disease)   . IBS (irritable bowel syndrome)   . PCOS (polycystic ovarian syndrome)   . Oligomenorrhea   . Goiter   . Hashimoto's thyroiditis   . Obesity   . Dyspepsia   . Hypothyroidism, acquired, autoimmune   . Fatigue   . History of shingles     face right   . History of recurrent  UTIs     Sees urologist  . Hx of varicella     Family History  Problem Relation Age of Onset  . Breast cancer Maternal Grandmother   . Colon cancer Maternal Grandmother   . Colon polyps Maternal Grandmother   . Cancer Maternal Grandmother   . Diabetes Paternal Grandfather   . Lupus Paternal Grandmother   . Thyroid disease Paternal Grandmother     Hypothyroid without surgery or irradiation.  . Osteopenia Mother   . Obesity Mother     Current outpatient prescriptions:ALPRAZolam (XANAX XR) 0.5 MG 24 hr tablet, Take by mouth every morning., Disp: , Rfl: ;  cetirizine (ZYRTEC) 10 MG tablet, Take 10 mg by mouth daily.  , Disp: , Rfl: ;  desvenlafaxine (PRISTIQ) 50 MG 24 hr tablet, Take 50 mg by mouth daily., Disp: , Rfl: ;  Norethindrone-Ethinyl Estradiol-Fe (GENERESS FE) 0.8-25 MG-MCG tablet, Chew 1 tablet by mouth daily.  , Disp: , Rfl:  cephALEXin (KEFLEX) 500 MG capsule, Take 1 capsule (500 mg total) by mouth 3 (three) times daily., Disp: 30 capsule, Rfl: 0;  diphenhydrAMINE (BENADRYL) 25 mg capsule, Take 1-2 capsules (25-50  mg total) by mouth every 6 (six) hours as needed for itching or allergies., Disp: 30 capsule, Rfl: 0;  EPINEPHrine (EPIPEN) 0.3 mg/0.3 mL DEVI, Inject 0.3 mLs (0.3 mg total) into the muscle as needed., Disp: 2 Device, Rfl: 0 loperamide (IMODIUM A-D) 2 MG tablet, Take as directed, Disp: 1 tablet, Rfl: 0;  promethazine (PHENERGAN) 25 MG tablet, Take 1 tablet (25 mg total) by mouth every 6 (six) hours as needed for nausea., Disp: 30 tablet, Rfl: 0;  valACYclovir (VALTREX) 1000 MG tablet, Take 2 po bid for fever blisters, Disp: 30 tablet, Rfl: 1  Allergies as of 07/11/2013 - Review Complete 07/11/2013  Allergen Reaction Noted  . Milk-related compounds Nausea And Vomiting 01/20/2013  . Nsaids  03/29/2011  . Penicillins  07/24/2011    1. Work and Family: College is going great. She will graduate from [employer] in May 2015. She is getting all A's. She is still living in her own  apartment. She is dating someone else now. Life is good.  2. Activities: Not much 3. Smoking, alcohol, or drugs: None 4. Primary Care Provider: Dr. Berniece Andreas, MD 5. Psychiatrist: Dr. Milagros Evener, MD - phone [phone], fax [phone] 6. GYN: Dr. Marcelle Overlie, MD  REVIEW OF SYSTEMS: There are no other significant problems involving Tamala's other body systems.   Objective:  Vital Signs:  BP 117/77  Pulse 89  Wt 164 lb 8 oz (74.617 kg)  She is on Sudafed today for her URI.    Ht Readings from Last 3 Encounters:  01/20/13 5\' 6"  (1.676 m)  10/03/12 5' 4.5" (1.638 m)  05/31/12 5' 4.75" (1.645 m)   Wt Readings from Last 3 Encounters:  07/11/13 164 lb 8 oz (74.617 kg)  04/17/13 168 lb (76.204 kg)  01/20/13 170 lb (77.111 kg)   There is no height on file to calculate BSA.  Normalized stature-for-age data available only for age 39 to 20 years. Normalized weight-for-age data available only for age 39 to 20 years.  PHYSICAL EXAM:  Constitutional: Zaiya is bright, perky, beautiful, and glowing today;. She is vibrant, very chatty, very upbeat and positive about how she feels. She notes that she is physically and emotionally ready to continue to move forward with her life now. She has lost 8 pounds in the past 6 months.  Face: The face appears normal.  Eyes: There is no obvious arcus or proptosis. Moisture appears normal. Mouth: The oropharynx and tongue appear normal. Dentition appears to be normal for age. Oral moisture is normal. She has no hyperpigmentation of the oral mucosa. Neck: The neck appears to be visibly normal. No carotid bruits are noted. The thyroid gland is mildly enlarged at  about 21-22 grams in size. Her thyroid isthmus is relatively large. Her right lobe is within normal limits for size. The left lobe is only minimally enlarged.The consistency of the thyroid gland is normal. The thyroid gland is very mildly tender in the left mid-lobe today.  Lungs: The lungs are  clear to auscultation. Air movement is good. Heart: Heart rate and rhythm are regular. Heart sounds S1 and S2 are normal. I did not appreciate any pathologic cardiac murmurs. Abdomen: The abdomen is somewhat enlarged. Bowel sounds are normal. There is no obvious hepatomegaly, splenomegaly, or other mass effect.  Arms: Muscle size and bulk are normal for age. Hands: There is no obvious tremor. Phalangeal and metacarpophalangeal joints are normal. Palmar muscles are normal. Palmar skin is normal. Palmar moisture is also normal. Nails are  normally pink. There is no palmar hyperpigmentation.  Legs: Muscles appear normal for age. No edema is present. Neurologic: Strength is normal for age in both the upper and lower extremities. Muscle tone is normal. Sensation to touch is normal in both legs.   Skin: She is wearing makeup today.   LAB DATA:  07/09/13: 25-hydroxy Vitamin D 31; calcium 9.2, PTH 64.3;  10/23/12: TSH 0.812, free T4 1.02, free T3 2.9; iron 114, 25-hydroxy vitamin D 20, calcium 9.5, PTH 40.6 07/04/12 at 9 AM: ACTH 24, cortisol 17.3 02/18/12: TSH 1.208, free T4, free T3 3.0  Bone mineral density 09/22/12: Osteopenia of the spine based upon T-score for age   Assessment and Plan:   ASSESSMENT:  1. Thyroiditis: The patient's Hashimoto's disease is mildly active today. She was chemically euthyroid in March 2014. She appears to be clinically euthyroid.  2. Goiter: The thyroid gland is a bit larger today. The waxing and waning of thyroid gland size shows that she has more thyroiditis at times and less at other times.  3. Obesity: She has continued to lose weight. She is eating healthier. She would also benefit from exercising daily.   4. Dyspepsia: This problem has resolved.. 5. Fatigue: She is doing much better. She no longer feels fatigued. Her multivitamin and probiotic may be helping. 6. Anxiety and depression: She is doing fantastically. She looks even better at this visit than she did 6  months ago.  7. Chronic/recurrent/frequent infectious/inflammatory disease: She has been free of these problems for the past several months since eliminating gluten and dairy.   8. Multiple fractures: She has not had any further fractures. Her bone mineral density showed that hers spine is osteopenic. Her PTH in March was mid-range norma, but has increased since then. Her calcium has decreased from about the 60% to about the 40%. Her vitamin D is now low-normal. She has been taking a multivitamin with D. In order to maximize bone mineral density, we need to increase the calcium level to the upper quartile. Daily exercise will help her build bone.       PLAN:  1. Diagnostic:  The following blood tests will be drawn prior to next visit: 25-OH vitamin D, PTH, calcium,1,25-dihydroxy Vitamin D, and TFTs. 2. Therapeutic: Take one good MVI per day, either Centrum for Women or One-A-Day for Women. Add Citracal-D, one per day. Get an hour or more of exercise per day.  3. Patient education: We discussed osteopenia, osteoporosis, vitamin D deficiency, her chronic fatigue symptoms and her anxiety and depression.   4. Follow-up: 5 months  Level of Service: This visit lasted in excess of 55 minutes. More than 50% of the visit was devoted to counseling.  David Stall, MD 07/11/2013 1:45 PM

## 2013-07-14 LAB — VITAMIN D 1,25 DIHYDROXY
Vitamin D2 1, 25 (OH)2: 26 pg/mL
Vitamin D3 1, 25 (OH)2: 108 pg/mL

## 2013-07-16 ENCOUNTER — Encounter: Payer: Self-pay | Admitting: *Deleted

## 2013-07-27 ENCOUNTER — Other Ambulatory Visit: Payer: Self-pay | Admitting: Otolaryngology

## 2013-08-07 ENCOUNTER — Other Ambulatory Visit: Payer: Self-pay | Admitting: Internal Medicine

## 2013-08-24 ENCOUNTER — Other Ambulatory Visit: Payer: Self-pay | Admitting: *Deleted

## 2013-08-24 DIAGNOSIS — E038 Other specified hypothyroidism: Secondary | ICD-10-CM

## 2013-08-30 ENCOUNTER — Other Ambulatory Visit: Payer: Self-pay | Admitting: "Endocrinology

## 2013-08-31 LAB — T4, FREE: FREE T4: 1.17 ng/dL (ref 0.80–1.80)

## 2013-08-31 LAB — TSH: TSH: 0.778 u[IU]/mL (ref 0.350–4.500)

## 2013-08-31 LAB — T3, FREE: T3, Free: 3.4 pg/mL (ref 2.3–4.2)

## 2013-08-31 LAB — THYROID PEROXIDASE ANTIBODY: THYROID PEROXIDASE ANTIBODY: 11.2 [IU]/mL (ref <35.0)

## 2013-09-01 LAB — THYROID STIMULATING IMMUNOGLOBULIN: TSI: 43 % baseline (ref <140)

## 2013-09-10 ENCOUNTER — Telehealth: Payer: Self-pay | Admitting: Internal Medicine

## 2013-09-10 NOTE — Telephone Encounter (Signed)
Spoke to Catherine [name] and informed her that gynecology does not require PA.  [facility]  Will forward this message on to her and will do referrals when reviewed by Dr. Fabian SharpPanosh.

## 2013-09-10 NOTE — Telephone Encounter (Signed)
Pt has tonsil surgery in dec and pt needs referral due to having new insurance for Dr Christia Readingwight Bates  ([place] [facility]) Pt also is having ovarian cyst issues and needs to change BC pills needs referral to Dr Marcelle OverlieMichelle Grewal ([facility]) Another referral to Pediatric endocrinologist  Dr Molli KnockMichael Brennan  ([facility] [place]) Mom aware dr Fabian Sharppanosh out this week, and needs the one for Dr Vincente PoliGrewal ASAP.

## 2013-09-11 ENCOUNTER — Telehealth: Payer: Self-pay | Admitting: "Endocrinology

## 2013-09-11 NOTE — Telephone Encounter (Signed)
Routed to provider

## 2013-09-13 NOTE — Telephone Encounter (Signed)
[facility] KW

## 2013-09-16 NOTE — Telephone Encounter (Signed)
Ok to do these referrals to dr Jenne PaneBates who did the tonsil  procedure . Ongoing follow up care   OK to refer to dr Fransico MichaelBrennan   Ongoing care   What is her insurance? Nothing noted in records

## 2013-09-17 ENCOUNTER — Other Ambulatory Visit: Payer: Self-pay | Admitting: Family Medicine

## 2013-09-17 DIAGNOSIS — Z7689 Persons encountering health services in other specified circumstances: Secondary | ICD-10-CM

## 2013-09-17 DIAGNOSIS — E063 Autoimmune thyroiditis: Secondary | ICD-10-CM

## 2013-09-17 NOTE — Telephone Encounter (Signed)
Orders placed in the system.

## 2013-09-20 ENCOUNTER — Telehealth: Payer: Self-pay | Admitting: Internal Medicine

## 2013-09-20 NOTE — Telephone Encounter (Signed)
Pt's insurance now requires referral for all specialty visits. Please place a referrals for the following;  Dr Marcelle OverlieMichelle Grewal Visit to monitor ovarian cysts - Do we need pre-certification for a gynecological visit? [facility] [phone] [address], [address] [place], [place] [address]  Dr Molli KnockMichael Brennan Follow up visits to monitor Hashimotos Thyroiditis (May 7 appointment & maybe one more visit this year) [facility] [id number] [address], [address] [place] [address]  Dr Milagros Evenerupinder Kaur (she is an Building control surveyorin-network Psychiatrist) Monitor mental health medications ( May 1 appointment with 1 or 2 follow-up appointments this year) [phone] [address], [address] [place], [place] [address]

## 2013-09-24 ENCOUNTER — Other Ambulatory Visit: Payer: Self-pay | Admitting: Family Medicine

## 2013-09-24 DIAGNOSIS — Z79899 Other long term (current) drug therapy: Secondary | ICD-10-CM

## 2013-09-24 DIAGNOSIS — E063 Autoimmune thyroiditis: Secondary | ICD-10-CM

## 2013-09-24 DIAGNOSIS — N83209 Unspecified ovarian cyst, unspecified side: Secondary | ICD-10-CM

## 2013-09-24 NOTE — Telephone Encounter (Signed)
Orders placed in the system.

## 2013-09-25 ENCOUNTER — Encounter: Payer: Self-pay | Admitting: *Deleted

## 2013-10-15 ENCOUNTER — Encounter: Payer: Self-pay | Admitting: Internal Medicine

## 2013-10-15 ENCOUNTER — Ambulatory Visit (INDEPENDENT_AMBULATORY_CARE_PROVIDER_SITE_OTHER): Payer: 59 | Admitting: Internal Medicine

## 2013-10-15 VITALS — BP 110/76 | HR 79 | Temp 97.7°F | Ht 64.75 in | Wt 164.0 lb

## 2013-10-15 DIAGNOSIS — J069 Acute upper respiratory infection, unspecified: Secondary | ICD-10-CM

## 2013-10-15 DIAGNOSIS — K529 Noninfective gastroenteritis and colitis, unspecified: Secondary | ICD-10-CM

## 2013-10-15 DIAGNOSIS — R197 Diarrhea, unspecified: Secondary | ICD-10-CM

## 2013-10-15 DIAGNOSIS — J329 Chronic sinusitis, unspecified: Secondary | ICD-10-CM

## 2013-10-15 MED ORDER — CEFDINIR 300 MG PO CAPS: 300.0000 mg | ORAL_CAPSULE | Freq: Two times a day (BID) | ORAL | Status: DC

## 2013-10-15 NOTE — Patient Instructions (Signed)
I agree  With treatment for sinusitis . Expect improvement the next 3-5 days .

## 2013-10-15 NOTE — Progress Notes (Signed)
Chief Complaint  Patient presents with  . Nasal Congestion    Started on the 16th of Feb. with upper respiratory.  Currently has thick, green,  bloody sputum  . Fatigue  . Cough    HPI: Patient comes in today for SDA for  new problem evaluation. With mom to. Onset of a head cold early in February that was getting better and then on February 15 had a flulike illness felt badly respiratory coughing and having continued upper respiratory congestion nasal drainage of green and bloody and postnasal drainage. Her facial pressures continued and she has missed some school because of this. Her GI situation deteriorated again before this illness and then appears to be a bit worse with it but over-the-counter decongestants cough and cold medicines avoiding lactose have not helped her improve.   ROS: See pertinent positives and negatives per HPI. Valtrex helps cold sores. Still gets. Anal rectal bleeding when her bowel acts up no current fever or weight loss. Has had a tonsillectomy that did help her throat She's been taking her probiotic was able to take Keflex last year when she had strep throat tonsillitis  Past Medical History  Diagnosis Date  . Anxiety     Counseling and psychiatry  . Chronic headaches     Sees specialist  . Thyroid disease   . History of lower GI bleeding   . PUD (peptic ulcer disease)   . IBS (irritable bowel syndrome)   . PCOS (polycystic ovarian syndrome)   . Oligomenorrhea   . Goiter   . Hashimoto's thyroiditis   . Obesity   . Dyspepsia   . Hypothyroidism, acquired, autoimmune   . Fatigue   . History of shingles     face right   . History of recurrent UTIs     Sees urologist  . Hx of varicella     Family History  Problem Relation Age of Onset  . Breast cancer Maternal Grandmother   . Colon cancer Maternal Grandmother   . Colon polyps Maternal Grandmother   . Cancer Maternal Grandmother   . Diabetes Paternal Grandfather   . Lupus Paternal  Grandmother   . Thyroid disease Paternal Grandmother     Hypothyroid without surgery or irradiation.  . Osteopenia Mother   . Obesity Mother     History   Social History  . Marital Status: Single    Spouse Name: N/A    Number of Children: 0  . Years of Education: N/A   Occupational History  . student    Social History Main Topics  . Smoking status: Never Smoker   . Smokeless tobacco: Never Used  . Alcohol Use: No  . Drug Use: No  . Sexual Activity: Yes    Birth Control/ Protection: Pill   Other Topics Concern  . None   Social History Narrative   Lives by herself with 5 cats near [facility] [address] apartment. 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 Will be difficult to go back financially.    Outpatient Encounter Prescriptions as of 10/15/2013  Medication Sig  . ALPRAZolam (XANAX XR) 0.5 MG 24 hr tablet Take by mouth every morning.  . cephALEXin (KEFLEX) 500 MG capsule Take 1 capsule (500 mg total) by mouth 3 (three) times daily.  . cetirizine (ZYRTEC) 10 MG tablet  Take 10 mg by mouth daily.    [name]. desvenlafaxine (PRISTIQ) 50 MG 24 hr tablet Take 50 mg by mouth daily.  . diphenhydrAMINE (BENADRYL) 25 mg capsule Take 1-2 capsules (25-50 mg total) by mouth every 6 (six) hours as needed for itching or allergies.  [name]. EPINEPHrine (EPIPEN) 0.3 mg/0.3 mL DEVI Inject 0.3 mLs (0.3 mg total) into the muscle as needed.  . loperamide (IMODIUM A-D) 2 MG tablet Take as directed  . Multiple Vitamin (MULTI-VITAMIN PO) Take by mouth.  . norethindrone-ethinyl estradiol (OVCON-35,BALZIVA,BRIELLYN) 0.4-35 MG-MCG tablet Take 1 tablet by mouth daily.  . Probiotic Product (PROBIOTIC PO) Take by mouth.  . promethazine (PHENERGAN) 25 MG tablet Take 1 tablet (25 mg total) by mouth every 6 (six) hours as needed for nausea.  . valACYclovir (VALTREX)  1000 MG tablet TAKE TWO TABLETS BY MOUTH TWICE DAILY FOR FEVER BLISTERS  . cefdinir (OMNICEF) 300 MG capsule Take 1 capsule (300 mg total) by mouth 2 (two) times daily.  . [DISCONTINUED] Norethindrone-Ethinyl Estradiol-Fe (GENERESS FE) 0.8-25 MG-MCG tablet Chew 1 tablet by mouth daily.      EXAM:  BP 110/76  Pulse 79  Temp(Src) 97.7 F (36.5 C) (Oral)  Ht 5' 4.75" (1.645 m)  Wt 164 lb (74.39 kg)  BMI 27.49 kg/m2  SpO2 99%  Body mass index is 27.49 kg/(m^2).  WDWN in NAD  quiet respirations; mildly congested  . Non toxic .looks washed out  HEENT: Normocephalic ;atraumatic , Eyes;  PERRL, EOMs  Full, lids and conjunctiva clear,,Ears: no deformities, canals nl, TM landmarks normal, Nose: no deformity congested  e but congested;face maxillary  tender Mouth : OP clear without lesion or edema .tonsils absent  Red cobblestoning  Neck: Supple without adenopathy or masses or bruits Chest:  Clear to A&P without wheezes rales or rhonchi CV:  S1-S2 no gallops or murmurs peripheral perfusion is normal Skin :nl perfusion and no acute rashes   ASSESSMENT AND PLAN:  Discussed the following assessment and plan:  Sinusitis - benefit mroe than risk with antibiotic at this time.   Protracted URI  Chronic diarrhea of unknown origin - recurring  had been better with lactose free but flared past monthsdr PErry said cannot help her  -Patient advised to return or notify health care team  if symptoms worsen or persist or new concerns arise.  Patient Instructions  I agree  With treatment for sinusitis . Expect improvement the next 3-5 days .   Neta MendsWanda K. Shrika Milos M.D.  Pre visit review using our clinic review tool, if applicable. No additional management support is needed unless otherwise documented below in the visit note.

## 2013-10-17 ENCOUNTER — Encounter: Payer: Self-pay | Admitting: *Deleted

## 2013-10-17 ENCOUNTER — Telehealth: Payer: Self-pay | Admitting: Internal Medicine

## 2013-10-17 ENCOUNTER — Ambulatory Visit: Payer: 59 | Admitting: Family Medicine

## 2013-10-17 NOTE — Telephone Encounter (Signed)
Called and spoke to Dewayne Hatchnn (mother) who stated the pt has an appt on 10/18/13 at the gynecologist.

## 2013-10-17 NOTE — Telephone Encounter (Signed)
Patient Information:  Caller Name: Dewayne Hatchnn  Phone: [phone]  Patient: Catherine [name], Catherine [name]  Gender: Female  DOB: [birth year]  Age: 23 Years  PCP: Berniece AndreasPanosh, Wanda (Family Practice)  Pregnant: No  Office Follow Up:  Does the office need to follow up with this patient?: Yes  Instructions For The Office: She may be calling back for an appointment, trying her Gynecologist first, after starting antibiotic, this am, 10/17/2013 got a yeast infection with green discharge and vaginal area is burning and has large amount of edema. FYI antibiotic that was started is Cefdinir.  RN Note:  Afebrile. OV 10/15/2013 for Sinusitis and started Cefdinir, sinus infection improving and  onset Today, 10/17/2013 yeast infection symptoms (per Mom): started white vaginal discharge, now greenish color- vaginal discharge is odorless and vaginal area has burning pain and edema isincreasing (redness and swelling are swollen to the point Mom states was hard for her to insert the Monostat product Mom bought for her about 30 minutes ago (11:00). [facility] office has no appointment available and  refused RN/CAN offer for another office. Mom is going to try the gynecologist first, if no appointments available there now, will call our office back to be seen today.   Symptoms  Reason For Call & Symptoms: OV Monday for Sinus infection and mom thinks from antibiotic possible yeast infection: white drainage and  now vaginal swelling  Reviewed Health History In EMR: Yes  Reviewed Medications In EMR: Yes  Reviewed Allergies In EMR: Yes  Reviewed Surgeries / Procedures: Yes  Date of Onset of Symptoms: 10/15/2013  Treatments Tried: Monostat one 1200mg  and not sure if any relief gave 30 minutes ago.  Treatments Tried Worked: No OB / GYN:  LMP: 10/10/2013  Guideline(s) Used:  Vaginal Discharge  Disposition Per Guideline:   Go to Office Now  Reason For Disposition Reached:   Constant abdominal pain lasting > 2 hours  Advice Given:  Call  Back If:  There is no improvement after treating yourself for a vaginal yeast infection  You become worse.  Patient Will Follow Care Advice:  YES

## 2013-10-22 ENCOUNTER — Encounter: Payer: Self-pay | Admitting: Internal Medicine

## 2013-10-25 ENCOUNTER — Telehealth: Payer: Self-pay | Admitting: Internal Medicine

## 2013-10-25 NOTE — Telephone Encounter (Signed)
Pt has already established w/ [facility], Dr Jerelyn ScottMicheal Brennan.  Pt has [employer] and they need referral for pt to continue to see this MD. Pt has appt scheduled Dec 20, 1013. Pt may have other appt during this year, will this referral cover them also?

## 2013-10-31 NOTE — Telephone Encounter (Signed)
Responded via mychart

## 2013-11-02 ENCOUNTER — Ambulatory Visit: Payer: 59 | Admitting: Internal Medicine

## 2013-12-03 ENCOUNTER — Other Ambulatory Visit: Payer: Self-pay | Admitting: *Deleted

## 2013-12-03 DIAGNOSIS — E038 Other specified hypothyroidism: Secondary | ICD-10-CM

## 2013-12-11 ENCOUNTER — Ambulatory Visit: Payer: 59 | Admitting: "Endocrinology

## 2013-12-20 ENCOUNTER — Ambulatory Visit: Payer: BC Managed Care – PPO | Admitting: "Endocrinology

## 2013-12-26 ENCOUNTER — Ambulatory Visit: Payer: BC Managed Care – PPO | Admitting: "Endocrinology

## 2014-02-19 ENCOUNTER — Ambulatory Visit: Payer: 59 | Admitting: "Endocrinology

## 2014-03-12 ENCOUNTER — Ambulatory Visit: Payer: 59 | Admitting: "Endocrinology

## 2014-04-03 ENCOUNTER — Other Ambulatory Visit: Payer: Self-pay | Admitting: Obstetrics and Gynecology

## 2014-04-04 LAB — CYTOLOGY - PAP

## 2014-05-07 ENCOUNTER — Telehealth: Payer: Self-pay | Admitting: "Endocrinology

## 2014-05-08 ENCOUNTER — Other Ambulatory Visit: Payer: Self-pay | Admitting: *Deleted

## 2014-05-08 DIAGNOSIS — E038 Other specified hypothyroidism: Secondary | ICD-10-CM

## 2014-05-08 NOTE — Telephone Encounter (Signed)
Orders placed in portal. KW 

## 2014-05-09 ENCOUNTER — Ambulatory Visit: Payer: 59 | Admitting: "Endocrinology

## 2014-05-13 ENCOUNTER — Emergency Department (HOSPITAL_BASED_OUTPATIENT_CLINIC_OR_DEPARTMENT_OTHER)
Admission: EM | Admit: 2014-05-13 | Discharge: 2014-05-14 | Disposition: A | Discharge status: Home / Self Care | Payer: 59 | Attending: Emergency Medicine | Admitting: Emergency Medicine

## 2014-05-13 ENCOUNTER — Encounter (HOSPITAL_BASED_OUTPATIENT_CLINIC_OR_DEPARTMENT_OTHER): Payer: Self-pay | Admitting: Emergency Medicine

## 2014-05-13 DIAGNOSIS — K921 Melena: Secondary | ICD-10-CM | POA: Insufficient documentation

## 2014-05-13 DIAGNOSIS — R42 Dizziness and giddiness: Secondary | ICD-10-CM | POA: Diagnosis not present

## 2014-05-13 DIAGNOSIS — Z8619 Personal history of other infectious and parasitic diseases: Secondary | ICD-10-CM | POA: Insufficient documentation

## 2014-05-13 DIAGNOSIS — Z79899 Other long term (current) drug therapy: Secondary | ICD-10-CM | POA: Insufficient documentation

## 2014-05-13 DIAGNOSIS — R0602 Shortness of breath: Secondary | ICD-10-CM | POA: Diagnosis not present

## 2014-05-13 DIAGNOSIS — E669 Obesity, unspecified: Secondary | ICD-10-CM | POA: Insufficient documentation

## 2014-05-13 DIAGNOSIS — Z87440 Personal history of urinary (tract) infections: Secondary | ICD-10-CM | POA: Insufficient documentation

## 2014-05-13 DIAGNOSIS — R002 Palpitations: Secondary | ICD-10-CM | POA: Diagnosis not present

## 2014-05-13 DIAGNOSIS — Z3202 Encounter for pregnancy test, result negative: Secondary | ICD-10-CM | POA: Insufficient documentation

## 2014-05-13 DIAGNOSIS — Z862 Personal history of diseases of the blood and blood-forming organs and certain disorders involving the immune mechanism: Secondary | ICD-10-CM | POA: Insufficient documentation

## 2014-05-13 DIAGNOSIS — Z880 Allergy status to penicillin: Secondary | ICD-10-CM | POA: Diagnosis not present

## 2014-05-13 DIAGNOSIS — Z8639 Personal history of other endocrine, nutritional and metabolic disease: Secondary | ICD-10-CM | POA: Insufficient documentation

## 2014-05-13 DIAGNOSIS — F411 Generalized anxiety disorder: Secondary | ICD-10-CM | POA: Diagnosis not present

## 2014-05-13 DIAGNOSIS — K625 Hemorrhage of anus and rectum: Secondary | ICD-10-CM | POA: Insufficient documentation

## 2014-05-13 NOTE — ED Provider Notes (Signed)
CSN: [id number]     Arrival date & time 05/13/14  2311 History  This chart was scribed for Loren Racer, MD by Charline Bills, ED Scribe. The patient was seen in room MH10/MH10. Patient's care was started at 11:40 PM.   Chief Complaint  Patient presents with  . Rectal Bleeding   Patient is a 23 y.o. female presenting with hematochezia. The history is provided by the patient. No language interpreter was used.  Rectal Bleeding Quality:  Bright red Progression:  Worsening Chronicity:  Recurrent Similar prior episodes: yes   Associated symptoms: dizziness and light-headedness   Associated symptoms: no abdominal pain, no fever and no vomiting   Risk factors: hx of IBD   HPI Comments: Catherine [name] is a 23 y.o. female, with a h/o anemia, IBS, lower GI bleeding, who presents to the Emergency Department complaining of rectal bleeding. Patient states she has a history of chronic GI bleeding with multiple studies. She's had hemorrhoidal banding. She states that her bleeding has been worse in the last few days. She describes the department blood without associated pain. She reports associated generalized weakness, light-headedness, dyspnea, fatigue, occasional palpitations, lower back pain over the past few days. She has no focal weakness or numbness. Pt also reports vaginal spotting with BC pill. She denies fever and chills. Pt has had 2 colonoscopies and an endoscopy in the past. No h/o blood transfusion due to anemia.    Gastroenterologist: Yancey Flemings, MD with [facility]   Past Medical History  Diagnosis Date  . Anxiety     Counseling and psychiatry  . Chronic headaches     Sees specialist  . Thyroid disease   . History of lower GI bleeding   . PUD (peptic ulcer disease)   . IBS (irritable bowel syndrome)   . PCOS (polycystic ovarian syndrome)   . Oligomenorrhea   . Goiter   . Hashimoto's thyroiditis   . Obesity   . Dyspepsia   . Hypothyroidism, acquired, autoimmune   . Fatigue   .  History of shingles     face right   . History of recurrent UTIs     Sees urologist  . Hx of varicella    Past Surgical History  Procedure Laterality Date  . Knee surgery      x 2 femoral.  break  sp fall  7th grade   . Band hemorrhoidectomy    . Wisdom tooth extraction    . Knees     Family History  Problem Relation Age of Onset  . Breast cancer Maternal Grandmother   . Colon cancer Maternal Grandmother   . Colon polyps Maternal Grandmother   . Cancer Maternal Grandmother   . Diabetes Paternal Grandfather   . Lupus Paternal Grandmother   . Thyroid disease Paternal Grandmother     Hypothyroid without surgery or irradiation.  . Osteopenia Mother   . Obesity Mother    History  Substance Use Topics  . Smoking status: Never Smoker   . Smokeless tobacco: Never Used  . Alcohol Use: No   OB History   Grav Para Term Preterm Abortions TAB SAB Ect Mult Living                 Review of Systems  Constitutional: Positive for fatigue. Negative for fever and chills.  Respiratory: Positive for shortness of breath. Negative for cough and wheezing.   Cardiovascular: Positive for palpitations. Negative for chest pain and leg swelling.  Gastrointestinal: Positive for blood in  stool and hematochezia. Negative for nausea, vomiting, abdominal pain, diarrhea and constipation.  Genitourinary: Positive for vaginal bleeding. Negative for dysuria, frequency, hematuria, flank pain, vaginal discharge and pelvic pain.  Musculoskeletal: Positive for back pain and myalgias. Negative for neck pain and neck stiffness.  Skin: Negative for rash and wound.  Neurological: Positive for dizziness and light-headedness. Negative for weakness, numbness and headaches.  All other systems reviewed and are negative.  Allergies  Milk-related compounds; Nsaids; and Penicillins  Home Medications   Prior to Admission medications   Medication Sig Start Date End Date Taking? Authorizing Provider  ALPRAZolam (XANAX  XR) 0.5 MG 24 hr tablet Take by mouth every morning.    Historical Provider, MD  cetirizine (ZYRTEC) 10 MG tablet Take 10 mg by mouth daily.      Historical Provider, MD  desvenlafaxine (PRISTIQ) 50 MG 24 hr tablet Take 50 mg by mouth daily.    Historical Provider, MD  EPINEPHrine (EPIPEN) 0.3 mg/0.3 mL DEVI Inject 0.3 mLs (0.3 mg total) into the muscle as needed. 01/21/13   Jones Skene, MD  loperamide (IMODIUM A-D) 2 MG tablet Take as directed 06/21/11   Hilarie Fredrickson, MD  Multiple Vitamin (MULTI-VITAMIN PO) Take by mouth.    Historical Provider, MD  norethindrone-ethinyl estradiol (OVCON-35,BALZIVA,BRIELLYN) 0.4-35 MG-MCG tablet Take 1 tablet by mouth daily.    Historical Provider, MD  Probiotic Product (PROBIOTIC PO) Take by mouth.    Historical Provider, MD   Triage Vitals: BP 122/80  Pulse 76  Temp(Src) 98.4 F (36.9 C) (Oral)  Resp 18  Ht  (1.676 m)  Wt 170 lb (77.111 kg)  BMI 27.45 kg/m2  SpO2 100% Physical Exam  Nursing note and vitals reviewed. Constitutional: She is oriented to person, place, and time. She appears well-developed and well-nourished. No distress.  HENT:  Head: Normocephalic and atraumatic.  Mouth/Throat: Oropharynx is clear and moist. No oropharyngeal exudate.  Eyes: EOM are normal. Pupils are equal, round, and reactive to light.  Neck: Normal range of motion. Neck supple.  Cardiovascular: Normal rate and regular rhythm.   Pulmonary/Chest: Effort normal and breath sounds normal. No respiratory distress. She has no wheezes. She has no rales. She exhibits no tenderness.  Abdominal: Soft. Bowel sounds are normal. She exhibits no distension and no mass. There is no tenderness. There is no rebound and no guarding.  Genitourinary:  No obvious external or internal hemorrhoids. No fissures. Brown stool.  Musculoskeletal: Normal range of motion. She exhibits no edema and no tenderness.  No CVA tenderness. Mild lower lumbar midline pain. No step-offs or deformities.  No evidence of injury.  Neurological: She is alert and oriented to person, place, and time.  Patient is alert and oriented x3 with clear, goal oriented speech. Patient has 5/5 motor in all extremities. Sensation is intact to light touch. Bilateral finger-to-nose is normal with no signs of dysmetria. Patient has a normal gait and walks without assistance.   Skin: Skin is warm and dry. No rash noted. No erythema.  Psychiatric: She has a normal mood and affect. Her behavior is normal.    ED Course  Procedures (including critical care time) DIAGNOSTIC STUDIES: Oxygen Saturation is 100% on RA, normal by my interpretation.    COORDINATION OF CARE: 11:49 PM-Discussed treatment plan which includes diagnostic lab work with pt at bedside and pt agreed to plan.   Labs Review Labs Reviewed  CBC WITH DIFFERENTIAL  COMPREHENSIVE METABOLIC PANEL  URINALYSIS, ROUTINE W REFLEX MICROSCOPIC  PREGNANCY,  URINE  OCCULT BLOOD X 1 CARD TO LAB, STOOL   Imaging Review No results found.   EKG Interpretation None      Date: 05/14/2014  Rate: 74  Rhythm: normal sinus rhythm  QRS Axis: normal  Intervals: normal  ST/T Wave abnormalities: normal  Conduction Disutrbances:none  Narrative Interpretation:   Old EKG Reviewed: none available   MDM   Final diagnoses:  None      I personally performed the services described in this documentation, which was scribed in my presence. The recorded information has been reviewed and is accurate.  Patient is encouraged to drink plenty of fluids. I also advised her to followup with her gastroenterologist. She has been given return precautions and has voiced understanding.  Loren Racer, MD 05/14/14 [phone]

## 2014-05-13 NOTE — ED Notes (Signed)
Hx of anemia but tonight has rectal bleeding

## 2014-05-14 LAB — COMPREHENSIVE METABOLIC PANEL
ALK PHOS: 71 U/L (ref 39–117)
ALT: 11 U/L (ref 0–35)
ANION GAP: 12 (ref 5–15)
AST: 16 U/L (ref 0–37)
Albumin: 3.5 g/dL (ref 3.5–5.2)
BUN: 10 mg/dL (ref 6–23)
CO2: 26 meq/L (ref 19–32)
Calcium: 9.4 mg/dL (ref 8.4–10.5)
Chloride: 101 mEq/L (ref 96–112)
Creatinine, Ser: 0.8 mg/dL (ref 0.50–1.10)
GFR calc Af Amer: >90 mL/min (ref >90)
GLUCOSE: 92 mg/dL (ref 70–99)
POTASSIUM: 3.7 meq/L (ref 3.7–5.3)
SODIUM: 139 meq/L (ref 137–147)
TOTAL PROTEIN: 7.6 g/dL (ref 6.0–8.3)
Total Bilirubin: 0.3 mg/dL (ref 0.3–1.2)

## 2014-05-14 LAB — URINALYSIS, ROUTINE W REFLEX MICROSCOPIC
BILIRUBIN URINE: NEGATIVE
Glucose, UA: NEGATIVE mg/dL
Ketones, ur: NEGATIVE mg/dL
Leukocytes, UA: NEGATIVE
Nitrite: NEGATIVE
Protein, ur: NEGATIVE mg/dL
SPECIFIC GRAVITY, URINE: 1.009 (ref 1.005–1.030)
UROBILINOGEN UA: 0.2 mg/dL (ref 0.0–1.0)
pH: 6 (ref 5.0–8.0)

## 2014-05-14 LAB — CBC WITH DIFFERENTIAL/PLATELET
Basophils Absolute: 0 10*3/uL (ref 0.0–0.1)
Basophils Relative: 0 % (ref 0–1)
EOS ABS: 0.1 10*3/uL (ref 0.0–0.7)
Eosinophils Relative: 1 % (ref 0–5)
HCT: 39.5 % (ref 36.0–46.0)
HEMOGLOBIN: 13.4 g/dL (ref 12.0–15.0)
LYMPHS ABS: 2.9 10*3/uL (ref 0.7–4.0)
LYMPHS PCT: 31 % (ref 12–46)
MCH: 31.6 pg (ref 26.0–34.0)
MCHC: 33.9 g/dL (ref 30.0–36.0)
MCV: 93.2 fL (ref 78.0–100.0)
MONOS PCT: 5 % (ref 3–12)
Monocytes Absolute: 0.5 10*3/uL (ref 0.1–1.0)
NEUTROS PCT: 63 % (ref 43–77)
Neutro Abs: 6.1 10*3/uL (ref 1.7–7.7)
Platelets: 265 10*3/uL (ref 150–400)
RBC: 4.24 MIL/uL (ref 3.87–5.11)
RDW: 12.6 % (ref 11.5–15.5)
WBC: 9.6 10*3/uL (ref 4.0–10.5)

## 2014-05-14 LAB — URINE MICROSCOPIC-ADD ON

## 2014-05-14 LAB — OCCULT BLOOD X 1 CARD TO LAB, STOOL: FECAL OCCULT BLD: POSITIVE — AB

## 2014-05-14 LAB — PREGNANCY, URINE: Preg Test, Ur: NEGATIVE

## 2014-05-14 NOTE — Discharge Instructions (Signed)
Bloody Stools Bloody stools often mean that there is a problem in the digestive tract. Your caregiver may use the term "melena" to describe black, tarry, and bad smelling stools or "hematochezia" to describe red or maroon-colored stools. Blood seen in the stool can be caused by bleeding anywhere along the intestinal tract.  A black stool usually means that blood is coming from the upper part of the gastrointestinal tract (esophagus, stomach, or small bowel). Passing maroon-colored stools or bright red blood usually means that blood is coming from lower down in the large bowel or the rectum. However, sometimes massive bleeding in the stomach or small intestine can cause bright red bloody stools.  Consuming black licorice, lead, iron pills, medicines containing bismuth subsalicylate, or blueberries can also cause black stools. Your caregiver can test black stools to see if blood is present. It is important that the cause of the bleeding be found. Treatment can then be started, and the problem can be corrected. Rectal bleeding may not be serious, but you should not assume everything is okay until you know the cause.It is very important to follow up with your caregiver or a specialist in gastrointestinal problems. CAUSES  Blood in the stools can come from various underlying causes.Often, the cause is not found during your first visit. Testing is often needed to discover the cause of bleeding in the gastrointestinal tract. Causes range from simple to serious or even life-threatening.Possible causes include:  Hemorrhoids.These are veins that are full of blood (engorged) in the rectum. They cause pain, inflammation, and may bleed.  Anal fissures.These are areas of painful tearing which may bleed. They are often caused by passing hard stool.  Diverticulosis.These are pouches that form on the colon over time, with age, and may bleed significantly.  Diverticulitis.This is inflammation in areas with  diverticulosis. It can cause pain, fever, and bloody stools, although bleeding is rare.  Proctitis and colitis. These are inflamed areas of the rectum or colon. They may cause pain, fever, and bloody stools.  Polyps and cancer. Colon cancer is a leading cause of preventable cancer death.It often starts out as precancerous polyps that can be removed during a colonoscopy, preventing progression into cancer. Sometimes, polyps and cancer may cause rectal bleeding.  Gastritis and ulcers.Bleeding from the upper gastrointestinal tract (near the stomach) may travel through the intestines and produce black, sometimes tarry, often bad smelling stools. In certain cases, if the bleeding is fast enough, the stools may not be black, but red and the condition may be life-threatening. SYMPTOMS  You may have stools that are bright red and bloody, that are normal color with blood on them, or that are dark black and tarry. In some cases, you may only have blood in the toilet bowl. Any of these cases need medical care. You may also have:  Pain at the anus or anywhere in the rectum.  Lightheadedness or feeling faint.  Extreme weakness.  Nausea or vomiting.  Fever. DIAGNOSIS Your caregiver may use the following methods to find the cause of your bleeding:  Taking a medical history. Age is important. Older people tend to develop polyps and cancer more often. If there is anal pain and a hard, large stool associated with bleeding, a tear of the anus may be the cause. If blood drips into the toilet after a bowel movement, bleeding hemorrhoids may be the problem. The color and frequency of the bleeding are additional considerations. In most cases, the medical history provides clues, but seldom the final  answer.  A visual and finger (digital) exam. Your caregiver will inspect the anal area, looking for tears and hemorrhoids. A finger exam can provide information when there is tenderness or a growth inside. In men, the  prostate is also examined.  Endoscopy. Several types of small, long scopes (endoscopes) are used to view the colon.  In the office, your caregiver may use a rigid, or more commonly, a flexible viewing sigmoidoscope. This exam is called flexible sigmoidoscopy. It is performed in 5 to 10 minutes.  A more thorough exam is accomplished with a colonoscope. It allows your caregiver to view the entire 5 to 6 foot long colon. Medicine to help you relax (sedative) is usually given for this exam. Frequently, a bleeding lesion may be present beyond the reach of the sigmoidoscope. So, a colonoscopy may be the best exam to start with. Both exams are usually done on an outpatient basis. This means the patient does not stay overnight in the hospital or surgery center.  An upper endoscopy may be needed to examine your stomach. Sedation is used and a flexible endoscope is put in your mouth, down to your stomach.  A barium enema X-ray. This is an X-ray exam. It uses liquid barium inserted by enema into the rectum. This test alone may not identify an actual bleeding point. X-rays highlight abnormal shadows, such as those made by lumps (tumors), diverticuli, or colitis. TREATMENT  Treatment depends on the cause of your bleeding.   For bleeding from the stomach or colon, the caregiver doing your endoscopy or colonoscopy may be able to stop the bleeding as part of the procedure.  Inflammation or infection of the colon can be treated with medicines.  Many rectal problems can be treated with creams, suppositories, or warm baths.  Surgery is sometimes needed.  Blood transfusions are sometimes needed if you have lost a lot of blood.  For any bleeding problem, let your caregiver know if you take aspirin or other blood thinners regularly. HOME CARE INSTRUCTIONS   Take any medicines exactly as prescribed.  Keep your stools soft by eating a diet high in fiber. Prunes (1 to 3 a day) work well for many people.  Drink  enough water and fluids to keep your urine clear or pale yellow.  Take sitz baths if advised. A sitz bath is when you sit in a bathtub with warm water for 10 to 15 minutes to soak, soothe, and cleanse the rectal area.  If enemas or suppositories are advised, be sure you know how to use them. Tell your caregiver if you have problems with this.  Monitor your bowel movements to look for signs of improvement or worsening. SEEK MEDICAL CARE IF:   You do not improve in the time expected.  Your condition worsens after initial improvement.  You develop any new symptoms. SEEK IMMEDIATE MEDICAL CARE IF:   You develop severe or prolonged rectal bleeding.  You vomit blood.  You feel weak or faint.  You have a fever. MAKE SURE YOU:  Understand these instructions.  Will watch your condition.  Will get help right away if you are not doing well or get worse. Document Released: 07/23/2002 Document Revised: 10/25/2011 Document Reviewed: 12/18/2010 [employer] Patient Information 2015 [place], [place]. This information is not intended to replace advice given to you by your health care provider. Make sure you discuss any questions you have with your health care provider. Dizziness Dizziness is a common problem. It is a feeling of unsteadiness or  light-headedness. You may feel like you are about to faint. Dizziness can lead to injury if you stumble or fall. A person of any age group can suffer from dizziness, but dizziness is more common in older adults. CAUSES  Dizziness can be caused by many different things, including:  Middle ear problems.  Standing for too long.  Infections.  An allergic reaction.  Aging.  An emotional response to something, such as the sight of blood.  Side effects of medicines.  Tiredness.  Problems with circulation or blood pressure.  Excessive use of alcohol or medicines, or illegal drug use.  Breathing too fast (hyperventilation).  An irregular heart  rhythm (arrhythmia).  A low red blood cell count (anemia).  Pregnancy.  Vomiting, diarrhea, fever, or other illnesses that cause body fluid loss (dehydration).  Diseases or conditions such as Parkinson's disease, high blood pressure (hypertension), diabetes, and thyroid problems.  Exposure to extreme heat. DIAGNOSIS  Your health care provider will ask about your symptoms, perform a physical exam, and perform an electrocardiogram (ECG) to record the electrical activity of your heart. Your health care provider may also perform other heart or blood tests to determine the cause of your dizziness. These may include:  Transthoracic echocardiogram (TTE). During echocardiography, sound waves are used to evaluate how blood flows through your heart.  Transesophageal echocardiogram (TEE).  Cardiac monitoring. This allows your health care provider to monitor your heart rate and rhythm in real time.  Holter monitor. This is a portable device that records your heartbeat and can help diagnose heart arrhythmias. It allows your health care provider to track your heart activity for several days if needed.  Stress tests by exercise or by giving medicine that makes the heart beat faster. TREATMENT  Treatment of dizziness depends on the cause of your symptoms and can vary greatly. HOME CARE INSTRUCTIONS   Drink enough fluids to keep your urine clear or pale yellow. This is especially important in very hot weather. In older adults, it is also important in cold weather.  Take your medicine exactly as directed if your dizziness is caused by medicines. When taking blood pressure medicines, it is especially important to get up slowly.  Rise slowly from chairs and steady yourself until you feel okay.  In the morning, first sit up on the side of the bed. When you feel okay, stand slowly while holding onto something until you know your balance is fine.  Move your legs often if you need to stand in one place  for a long time. Tighten and relax your muscles in your legs while standing.  Have someone stay with you for 1-2 days if dizziness continues to be a problem. Do this until you feel you are well enough to stay alone. Have the person call your health care provider if he or she notices changes in you that are concerning.  Do not drive or use heavy machinery if you feel dizzy.  Do not drink alcohol. SEEK IMMEDIATE MEDICAL CARE IF:   Your dizziness or light-headedness gets worse.  You feel nauseous or vomit.  You have problems talking, walking, or using your arms, hands, or legs.  You feel weak.  You are not thinking clearly or you have trouble forming sentences. It may take a friend or family member to notice this.  You have chest pain, abdominal pain, shortness of breath, or sweating.  Your vision changes.  You notice any bleeding.  You have side effects from medicine that seems to  be getting worse rather than better. MAKE SURE YOU:   Understand these instructions.  Will watch your condition.  Will get help right away if you are not doing well or get worse. Document Released: 01/26/2001 Document Revised: 08/07/2013 Document Reviewed: 02/19/2011 [facility] Patient Information 2015 [place], [place]. This information is not intended to replace advice given to you by your health care provider. Make sure you discuss any questions you have with your health care provider.

## 2014-05-24 LAB — VITAMIN D 25 HYDROXY (VIT D DEFICIENCY, FRACTURES): Vit D, 25-Hydroxy: 56 ng/mL (ref 30–89)

## 2014-05-24 LAB — PTH, INTACT AND CALCIUM
Calcium: 9.8 mg/dL (ref 8.4–10.5)
PTH: 36 pg/mL (ref 14–64)

## 2014-05-24 LAB — T4, FREE: Free T4: 1.1 ng/dL (ref 0.80–1.80)

## 2014-05-24 LAB — HEMOGLOBIN A1C
Hgb A1c MFr Bld: 4.8 % (ref <5.7)
Mean Plasma Glucose: 91 mg/dL (ref <117)

## 2014-05-24 LAB — CALCIUM: CALCIUM: 9.8 mg/dL (ref 8.4–10.5)

## 2014-05-24 LAB — T3, FREE: T3, Free: 3.3 pg/mL (ref 2.3–4.2)

## 2014-05-24 LAB — TSH: TSH: 0.57 u[IU]/mL (ref 0.350–4.500)

## 2014-05-27 ENCOUNTER — Ambulatory Visit (INDEPENDENT_AMBULATORY_CARE_PROVIDER_SITE_OTHER): Payer: 59 | Admitting: "Endocrinology

## 2014-05-27 ENCOUNTER — Encounter: Payer: Self-pay | Admitting: "Endocrinology

## 2014-05-27 VITALS — BP 118/81 | HR 89 | Wt 159.0 lb

## 2014-05-27 DIAGNOSIS — R5383 Other fatigue: Secondary | ICD-10-CM

## 2014-05-27 DIAGNOSIS — R1013 Epigastric pain: Secondary | ICD-10-CM

## 2014-05-27 DIAGNOSIS — E663 Overweight: Secondary | ICD-10-CM

## 2014-05-27 DIAGNOSIS — T07XXXA Unspecified multiple injuries, initial encounter: Secondary | ICD-10-CM

## 2014-05-27 DIAGNOSIS — F419 Anxiety disorder, unspecified: Secondary | ICD-10-CM

## 2014-05-27 DIAGNOSIS — F32A Depression, unspecified: Secondary | ICD-10-CM

## 2014-05-27 DIAGNOSIS — E063 Autoimmune thyroiditis: Secondary | ICD-10-CM

## 2014-05-27 DIAGNOSIS — F418 Other specified anxiety disorders: Secondary | ICD-10-CM

## 2014-05-27 DIAGNOSIS — E049 Nontoxic goiter, unspecified: Secondary | ICD-10-CM

## 2014-05-27 DIAGNOSIS — F329 Major depressive disorder, single episode, unspecified: Secondary | ICD-10-CM

## 2014-05-27 LAB — VITAMIN D 1,25 DIHYDROXY
VITAMIN D3 1, 25 (OH): 93 pg/mL
Vitamin D 1, 25 (OH)2 Total: 93 pg/mL — ABNORMAL HIGH (ref 18–72)
Vitamin D2 1, 25 (OH)2: <8 pg/mL

## 2014-05-27 NOTE — Progress Notes (Signed)
Subjective:  Patient Name: Catherine [name] Date of Birth: July 10, 1991  MRN: [id number]  Catherine [name]  presents to the office today for follow-up of her oligomenorrhea, PCOS, goiter, Hashimoto's thyroiditis, obesity, nausea and vomiting, dyspepsia, hypothyroidism, fatigue, anxiety and depression.[name]   HISTORY OF PRESENT ILLNESS:   Catherine [name] is a 23 y.o. Caucasian young woman.  Marvelyn was accompanied by her mother.  1. The patient was first referred to me on 09/09/06 by her gynecologist, Dr. Dian Queen, for evaluation and management of secondary amenorrhea. She was then 23 years old.  A. Patient had been essentially healthy up through age 27-13, when she underwent menarche. Her menstrual cycles have never been regular. Her last normal menstrual period occurred in about 2005. She was not on any oral contraceptives  or contraceptive implants at that time. Another obstetrician put her on oral contraceptives at that point. She gained about 20 pounds in weight in that first year on oral contraceptives. She started evaluation with Dr. Helane Rima in 2007 or so. Dr. Helane Rima put her on Yaz initially, but later converted her to [facility].The patient's past medical history was positive for a lot of depression, mood swings, and panic attacks. She also had 2 knee surgeries in the seventh grade. She did not do any type of routine physical activity. Family history was positive for diabetes in the paternal grandfather and hypothyroidism in the paternal grandmother.  Father had severe depression and anxiety. Maternal grandmother had colon cancer. Mother was obese. Mother also had endometriosis. As I later learned on 07/10/12, one of Cloa's mother's first cousins had recently developed Addison's Disease.  B. On physical examination, her height was at the 35th percentile and her weight at 177.6 pounds was at the 93rd percentile. Her BMI was 30.7. She had a 25+ gram thyroid gland. She was tender in the right lobe. She also had 1+  acanthosis nigricans. Previous laboratory data from 06/21/06 showed a normal CMP. Her TSH was 2.05 and her free T4 was 1.18. A serum glucose was 91 with a corresponding insulin level of 95, which was very excessive. Laboratory data at that visit included a blood glucose of 79 and hemoglobin A1c of 5.0%. Subsequent lab tests on 09/12/06 showed a fasting glucose of 72 and a fasting insulin of 22, again excessive. It appeared at that time that the patient was mildly obese. Her level of obesity was high enough, however, to cause a significant degree of insulin resistance and hyperinsulinemia. Her level of obesity may also have been high enough to cause oligomenorrhea. She had a goiter and clinical evidence of Hashimoto's disease, but she was euthyroid. I talked with the patient and her mother about our Eat Right Diet plan. I also talked about exercising 45-60 minutes a day. I started her on metformin, 500 mg, twice daily.  2. During the past 7 years, patient's weight has fluctuated up and down, varying between 150.7-179.4 lbs. She has continued to have flare ups of Hashimoto's disease. On 02/12/08 her TSH rose to 4.493, but then subsequently normalized. She has been euthyroid for most of the past 5 years. At times she's been on birth control patches, but most times has been on birth control pills. Her dyspepsia is better when she takes her Nexium and worse when she does not. Her anxiety and depression have been major problems for her. She has also had chronic fatigue, recurrent UTIs, and a myriad of gastrointestinal symptoms.   3. The patient's last [facility] visit was on 07/11/13. In the interim  she has been healthy. After graduation she stopped eating right and exercising for a while, but then got a job and has been eating right, exercising, and has lost 16 pounds. She feels "very good". She is upbeat and positive. Her diet is still 100% dairy-free, but she does eat some gluten-containing foods. Mom thinks that she has  more bloating and stomach upset when she takes in more gluten.She has not had any more UTIs, gout, kidney stones,or migraines since the Topamax was discontinued.  She has been doing pretty well emotionally. She feels that she manages her panic attacks much better,in part due to controlled breathing. She feels that Pristiq is working well. She has trouble affording expensive OCPs, so will talk with Dr. Helane Rima soon about less expensive options. She takes her multivitamin every day.She has not had any more fractures. She had her  tonsillectomy in December 2014. She has added vitamin D3, iodine tablet, and a calcium/magnesium supplement.  4. Pertinent Review of Systems:  Constitutional: The patient feels "really great". Her energy level is really good.  Eyes: Vision is good. There are no significant eye complaints. Neck: The patient has no complaints of anterior neck swelling, soreness, tenderness,  pressure, discomfort, or difficulty swallowing.  Heart: She had some heart fluttering two weeks ago and went to the ED. No cause was determined. She feels that she was dehydrated at the time. Heart rate increases with exercise or other physical activity. The patient has no other complaints of palpitations, irregular heat beats, chest pain, or chest pressure. Gastrointestinal: As above. "My stomach feels pretty good most of the time." Bowel movents seem normal. The patient has no complaints of excessive hunger, acid reflux, upset stomach, stomach aches or pains, diarrhea, or constipation.  Legs: She has occasional leg cramps. Muscle mass and strength seem normal. There are no complaints of numbness, tingling, burning, or pain. No edema is noted. Feet: There are no obvious foot problems. There are no complaints of numbness, tingling, burning, or pain. No edema is noted. GYN: LMP 2-3 weeks ago. Periods are regular on her oral contraceptives.   PAST MEDICAL, FAMILY, AND SOCIAL HISTORY:  Past Medical History   Diagnosis Date  . Anxiety     Counseling and psychiatry  . Chronic headaches     Sees specialist  . Thyroid disease   . History of lower GI bleeding   . PUD (peptic ulcer disease)   . IBS (irritable bowel syndrome)   . PCOS (polycystic ovarian syndrome)   . Oligomenorrhea   . Goiter   . Hashimoto's thyroiditis   . Obesity   . Dyspepsia   . Hypothyroidism, acquired, autoimmune   . Fatigue   . History of shingles     face right   . History of recurrent UTIs     Sees urologist  . Hx of varicella     Family History  Problem Relation Age of Onset  . Breast cancer Maternal Grandmother   . Colon cancer Maternal Grandmother   . Colon polyps Maternal Grandmother   . Cancer Maternal Grandmother   . Diabetes Paternal Grandfather   . Lupus Paternal Grandmother   . Thyroid disease Paternal Grandmother     Hypothyroid without surgery or irradiation.  . Osteopenia Mother   . Obesity Mother     Current outpatient prescriptions:ALPRAZolam (XANAX XR) 0.5 MG 24 hr tablet, Take by mouth every morning., Disp: , Rfl: ;  cetirizine (ZYRTEC) 10 MG tablet, Take 10 mg by mouth daily.  ,  Disp: , Rfl: ;  desvenlafaxine (PRISTIQ) 50 MG 24 hr tablet, Take 50 mg by mouth daily., Disp: , Rfl: ;  Multiple Vitamin (MULTI-VITAMIN PO), Take by mouth., Disp: , Rfl:  norethindrone-ethinyl estradiol (OVCON-35,BALZIVA,BRIELLYN) 0.4-35 MG-MCG tablet, Take 1 tablet by mouth daily., Disp: , Rfl: ;  Probiotic Product (PROBIOTIC PO), Take by mouth., Disp: , Rfl: ;  EPINEPHrine (EPIPEN) 0.3 mg/0.3 mL DEVI, Inject 0.3 mLs (0.3 mg total) into the muscle as needed., Disp: 2 Device, Rfl: 0;  loperamide (IMODIUM A-D) 2 MG tablet, Take as directed, Disp: 1 tablet, Rfl: 0  Allergies as of 05/27/2014 - Review Complete 05/27/2014  Allergen Reaction Noted  . Milk-related compounds Nausea And Vomiting 01/20/2013  . Nsaids  03/29/2011  . Penicillins  07/24/2011    1. 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  2.  Activities: She does a lot of physical activity at work. 3. Smoking, alcohol, or drugs: None 4. Primary Care Provider: Dr. Shanon Ace, MD 5. Psychiatrist: Dr. Chucky May, MD - phone [phone], fax [phone] 6. GYN: Dr. Dian Queen, MD  REVIEW OF SYSTEMS: There are no other significant problems involving Aslee's other body systems.   Objective:  Vital Signs:  BP 118/81  Pulse 89  Wt 159 lb (72.122 kg)    Ht Readings from Last 3 Encounters:  05/13/14 _0  (1.676 m)  10/15/13 5' 4.75" (1.645 m)  01/20/13 _1  (1.676 m)   Wt Readings from Last 3 Encounters:  05/27/14 159 lb (72.122 kg)  05/13/14 170 lb (77.111 kg)  10/15/13 164 lb (74.39 kg)   There is no height on file to calculate BSA.  Normalized stature-for-age data available only for age 36 to 52 years. Normalized weight-for-age data available only for age 36 to 20 years.  PHYSICAL EXAM:  Constitutional: Elvy is bright, upbeat, beautiful, and glowing today. She is vibrant, very chatty, and very positive about how she feels. She is actively engaged in moving on with her life. She has lost another 5 pounds in the past 10 months. She looks slimmer. Face: The face appears normal.  Eyes: There is no obvious arcus or proptosis. Moisture appears normal. Mouth: The oropharynx and tongue appear normal. Dentition appears to be normal for age. Oral moisture is normal. She has no hyperpigmentation of the oral mucosa. Neck: The neck appears to be visibly normal. No carotid bruits are noted. The thyroid gland is mildly enlarged at  about 21-22 grams in size. Her thyroid isthmus is relatively large. Her right lobe is within normal limits for size. The left lobe is only minimally enlarged.The consistency of the thyroid gland is normal. The thyroid gland is very mildly tender in the right mid-lobe today.  Lungs: The lungs are clear to auscultation. Air movement is good. Heart: Heart rate and rhythm are regular. Heart sounds S1 and  S2 are normal. I did not appreciate any pathologic cardiac murmurs. Abdomen: The abdomen is somewhat enlarged. Bowel sounds are normal. There is no obvious hepatomegaly, splenomegaly, or other mass effect.  Arms: Muscle size and bulk are normal for age. Hands: There is no obvious tremor. Phalangeal and metacarpophalangeal joints are normal. Palmar muscles are normal. Palmar skin is normal. Palmar moisture is also normal. Nails are normally pink. There is no palmar hyperpigmentation.  Legs: Muscles appear normal for age. No edema is present. Neurologic: Strength is normal for age in both the upper and lower extremities. Muscle tone is normal. Sensation to touch is normal  in both legs.   Skin: She is wearing makeup today.   LAB DATA:   Labs 05/23/14: TSH 0.570, free T4 1.10, free T3 3.3; HbA1c 4.8%; calcium 9.8, PTH 36, 25-hydroxy vitamin D 56  Labs 07/09/13: 25-hydroxy Vitamin D 31; calcium 9.2, PTH 64.3;   Labs 08/30/13; TSH 0.778, free T4 1.17, free T3  3.4, TSI 43  Labs 10/23/12: TSH 0.812, free T4 1.02, free T3 2.9; iron 114, 25-hydroxy vitamin D 20, calcium 9.5, PTH 40.6  Labs 07/04/12 at 9 AM: ACTH 24, cortisol 17.3  labs 02/18/12: TSH 1.208, free T4, free T3 3.0  Bone mineral density 09/22/12: Osteopenia of the spine based upon T-score for age   Assessment and Plan:   ASSESSMENT:  1. Thyroiditis: The patient's Hashimoto's disease is mildly active again today, but this time in the right mid-lobe area. She was chemically euthyroid in March 2014, January 2015, and October 2015. She appears to be clinically euthyroid. The shift of all 3 TFTs downward together from January to October 2015 is pathognomonic for a flare up of Hashimoto's disease. 2. Goiter: The thyroid gland is about the same size today. The waxing and waning of thyroid gland size shows that she has more thyroiditis at times and less at other times.  3. Obesity: She has continued to lose weight. She is eating healthier. She  would also benefit from exercising daily.   4. Dyspepsia: This problem has resolved.. 5. Fatigue: She is doing much better. She no longer feels fatigued. Her multivitamin and probiotic may be helping. 6. Anxiety and depression: She is doing very well. She looks great today.   7. Chronic/recurrent/frequent infectious/inflammatory disease: She has been free of these problems for the past several months since eliminating gluten and dairy.   8. Multiple fractures: She has not had any further fractures. Her bone mineral density showed that her spine is osteopenic. Her PTH in this month was mid-range normal. She is taking various herbal and vitamin products. Calcium and vitamin D levels are also mid-range normal. In order to maximize bone mineral density, we need to increase the calcium level to the upper quartile. Daily exercise will help her build bone.       PLAN:  1. Diagnostic:  The following blood tests will be drawn prior to next visit: 25-OH vitamin D, PTH, calcium,1,25-dihydroxy Vitamin D, and TFTs. 2. Therapeutic: Take one good MVI per day, either Centrum for Women or One-A-Day for Women. Continue the calcium, magnesium, and vitamin D supplements. Get an hour or more of exercise per day.  3. Patient education: We discussed osteopenia, osteoporosis, vitamin D deficiency, thyroiditis, her chronic fatigue symptoms and her anxiety and depression.   4. Follow-up: 6 months  Level of Service: This visit lasted in excess of 55 minutes. More than 50% of the visit was devoted to counseling.  Sherrlyn Hock, MD 05/27/2014 2:33 PM

## 2014-05-27 NOTE — Patient Instructions (Signed)
Follow up visit in 6 months. Please repeat blood tests 1-1 weeks prior to next appointment.

## 2014-05-29 ENCOUNTER — Telehealth: Payer: Self-pay | Admitting: Internal Medicine

## 2014-05-29 DIAGNOSIS — E063 Autoimmune thyroiditis: Secondary | ICD-10-CM

## 2014-05-29 NOTE — Telephone Encounter (Signed)
Ok to do if he is seeing  Adult patient still .

## 2014-05-29 NOTE — Telephone Encounter (Signed)
Per pt mother call would like another referral for her daughter to see [facility]: David StallBrennan Michael J MD PEDIATRIC ENDOCRINOLOGY

## 2014-05-31 ENCOUNTER — Ambulatory Visit (INDEPENDENT_AMBULATORY_CARE_PROVIDER_SITE_OTHER): Payer: 59 | Admitting: Internal Medicine

## 2014-05-31 ENCOUNTER — Encounter: Payer: Self-pay | Admitting: Internal Medicine

## 2014-05-31 VITALS — BP 114/70 | Temp 99.5°F | Ht 64.5 in | Wt 175.0 lb

## 2014-05-31 DIAGNOSIS — K121 Other forms of stomatitis: Secondary | ICD-10-CM

## 2014-05-31 DIAGNOSIS — R51 Headache: Secondary | ICD-10-CM

## 2014-05-31 DIAGNOSIS — R519 Headache, unspecified: Secondary | ICD-10-CM | POA: Insufficient documentation

## 2014-05-31 DIAGNOSIS — B001 Herpesviral vesicular dermatitis: Secondary | ICD-10-CM

## 2014-05-31 MED ORDER — LIDOCAINE VISCOUS 2 % MT SOLN: OROMUCOSAL | Status: DC

## 2014-05-31 MED ORDER — VALACYCLOVIR HCL 1 G PO TABS: 2000.0000 mg | ORAL_TABLET | Freq: Two times a day (BID) | ORAL | Status: DC

## 2014-05-31 MED ORDER — HYDROCODONE-ACETAMINOPHEN 5-325 MG PO TABS: 1.0000 | ORAL_TABLET | ORAL | Status: DC | PRN

## 2014-05-31 NOTE — Patient Instructions (Addendum)
Take valtrex 1 gram 3 x per day .  For now for  A week.  This is treatment for shingless  Can still take 2  Twice a day for regular cold sores.  Try topical xylocaine  Small amount to mouth  parea . Pain med as needed for now . Fluids

## 2014-05-31 NOTE — Progress Notes (Signed)
Pre visit review using our clinic review tool, if applicable. No additional management support is needed unless otherwise documented below in the visit note.  Chief Complaint  Patient presents with  . Left Side Facial Pain    HPI: Patient Catherine [name]  comes in today for SDA for  new problem evaluation.  has had   "rash in mouth " and left sided face pain . Here with mom today  Noted some tingling on the left side of her face became painful  Took valtrex  Then blisters roof of mouth left side  And flu like symptoms without specific fever. A little bit of sore throat with it. Continues to take the Valtrex has had 6 doses just feels bad. Remote hx of shingles  rash across face   .alos was in mouth .   2 bid ( 6 doses)    per HPI. No other rash. Has used Tylenol. No help. Has used hydrocodone half a pill in the past for as needed cramps rescue.  Past Medical History  Diagnosis Date  . Anxiety     Counseling and psychiatry  . Chronic headaches     Sees specialist  . Thyroid disease   . History of lower GI bleeding   . PUD (peptic ulcer disease)   . IBS (irritable bowel syndrome)   . PCOS (polycystic ovarian syndrome)   . Oligomenorrhea   . Goiter   . Hashimoto's thyroiditis   . Obesity   . Dyspepsia   . Hypothyroidism, acquired, autoimmune   . Fatigue   . History of shingles     face right   . History of recurrent UTIs     Sees urologist  . Hx of varicella     Family History  Problem Relation Age of Onset  . Breast cancer Maternal Grandmother   . Colon cancer Maternal Grandmother   . Colon polyps Maternal Grandmother   . Cancer Maternal Grandmother   . Diabetes Paternal Grandfather   . Lupus Paternal Grandmother   . Thyroid disease Paternal Grandmother     Hypothyroid without surgery or irradiation.  . Osteopenia Mother   . Obesity Mother     History   Social History  . Marital Status: Single    Spouse Name: N/A    Number of Children: 0  . Years of  Education: N/A   Occupational History  . student    Social History Main Topics  . Smoking status: Never Smoker   . Smokeless tobacco: Never Used  . Alcohol Use: No  . Drug Use: No  . Sexual Activity: Yes    Birth Control/ Protection: Pill   Other Topics Concern  . None   Social History Narrative   [place] by herself [place] 5 cats [facility]. Household to 3 at home 10 hours of sleep   Negative alcohol tobacco some caffeine occasionally   Fifth year senior 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 Will be difficult to go back financially.    Outpatient Encounter Prescriptions as of 05/31/2014  Medication Sig  . ALPRAZolam (XANAX XR) 0.5 MG 24 hr tablet Take by mouth every morning.  . cetirizine (ZYRTEC) 10 MG tablet Take 10 mg by mouth daily.    [name]. desvenlafaxine (PRISTIQ) 50 MG 24 hr tablet Take 50 mg by mouth daily.  [name]. EPINEPHrine (EPIPEN) 0.3 mg/0.3 mL DEVI Inject 0.3 mLs (0.3 mg total) into the muscle  as needed.  . loperamide (IMODIUM A-D) 2 MG tablet Take as directed  . Multiple Vitamin (MULTI-VITAMIN PO) Take by mouth.  . norethindrone-ethinyl estradiol (OVCON-35,BALZIVA,BRIELLYN) 0.4-35 MG-MCG tablet Take 1 tablet by mouth daily.  . Probiotic Product (PROBIOTIC PO) Take by mouth.  [name]. HYDROcodone-acetaminophen (NORCO/VICODIN) 5-325 MG per tablet Take 1 tablet by mouth every 4 (four) hours as needed for moderate pain.  [name]. lidocaine (XYLOCAINE) 2 % solution Apply to painful mouth sores  Up to every 2-4 hours .  . valACYclovir (VALTREX) 1000 MG tablet Take 2 tablets (2,000 mg total) by mouth 2 (two) times daily. Or as directed    EXAM:  BP 114/70  Temp(Src) 99.5 F (37.5 C) (Oral)  Ht 5' 4.5" (1.638 m)  Wt 175 lb (79.379 kg)  BMI 29.59 kg/m2  Body mass index is 29.59 kg/(m^2).  GENERAL: vitals reviewed and listed above, alert, oriented, appears well hydrated and in no acute distress mildly uncomfortable HEENT:  atraumatic, conjunctiva  clear, no obvious abnormalities on inspection of external nose and ears TMs are clear OP : no edema or exudate left upper hard palate a little crop of early vesicle no other rash no edema no facial rash ears are pierced. NECK: no obvious masses on inspection palpation  LUNGS: clear to auscultation bilaterally, no wheezes, rales or rhonchi, good air movement CV: HRRR, no clubbing cyanosis or  peripheral edema nl cap refill  MS: moves all extremities without noticeable focal  abnormality PSYCH: pleasant and cooperative, tearful at times for discomfort but cooperative eyes are clear Lab Results  Component Value Date   WBC 9.6 05/14/2014   HGB 13.4 05/14/2014   HCT 39.5 05/14/2014   PLT 265 05/14/2014   GLUCOSE 92 05/14/2014   ALT 11 05/14/2014   AST 16 05/14/2014   NA 139 05/14/2014   K 3.7 05/14/2014   CL 101 05/14/2014   CREATININE 0.80 05/14/2014   BUN 10 05/14/2014   CO2 26 05/14/2014   TSH 0.570 05/23/2014   HGBA1C 4.8 05/23/2014    ASSESSMENT AND PLAN:  Discussed the following assessment and plan:  Mouth ulcer - Uncertain cause question viral left facial neuralgia treat as if shingles with Valtrex local care and pain follow up if persistent  Left facial pain  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 -Patient advised to return or notify health care team  if symptoms worsen ,persist or new concerns arise.  Patient Instructions  Take valtrex 1 gram 3 x per day .  For now for  A week.  This is treatment for shingless  Can still take 2  Twice a day for regular cold sores.  Try topical xylocaine  Small amount to mouth  parea . Pain med as needed for now . Fluids     Neta MendsWanda K. Nathaniel Wakeley M.D.

## 2014-05-31 NOTE — Telephone Encounter (Signed)
Order placed in the system for referral. 

## 2014-06-07 ENCOUNTER — Telehealth: Payer: Self-pay | Admitting: Internal Medicine

## 2014-06-07 NOTE — Telephone Encounter (Signed)
Mom called b/c insurance did not pay for the referral appt to Dr Fransico MichaelBrennan. The referral was put in on 10/16, but she states her appt was 10/12. Also, this md is a Chief Strategy Officerendocrenologist.  We have cardiolgist.  This appt was her annual.

## 2014-09-27 ENCOUNTER — Telehealth: Payer: Self-pay | Admitting: Internal Medicine

## 2014-09-27 ENCOUNTER — Encounter: Payer: Self-pay | Admitting: Family Medicine

## 2014-09-27 ENCOUNTER — Ambulatory Visit (INDEPENDENT_AMBULATORY_CARE_PROVIDER_SITE_OTHER): Payer: BLUE CROSS/BLUE SHIELD | Admitting: Family Medicine

## 2014-09-27 VITALS — BP 110/70 | HR 72 | Temp 98.3°F | Wt 175.0 lb

## 2014-09-27 DIAGNOSIS — L710 Perioral dermatitis: Secondary | ICD-10-CM

## 2014-09-27 MED ORDER — DOXYCYCLINE HYCLATE 100 MG PO CAPS: 100.0000 mg | ORAL_CAPSULE | Freq: Two times a day (BID) | ORAL | Status: DC

## 2014-09-27 MED ORDER — TRIAMCINOLONE ACETONIDE 0.1 % EX CREA: 1.0000 "application " | TOPICAL_CREAM | Freq: Two times a day (BID) | CUTANEOUS | Status: DC

## 2014-09-27 NOTE — Progress Notes (Signed)
Pre visit review using our clinic review tool, if applicable. No additional management support is needed unless otherwise documented below in the visit note. 

## 2014-09-27 NOTE — Telephone Encounter (Signed)
Mom states the blisters have come back on pt's face again.  Pt was seen in oct and thought it might be shingles. Would like appt today, this afternoon.  However, nothing avail except the SD sick kids.  Scheduled w/ dr Caryl Neverburchette.  Does dr Fabian Sharppanosh prefer to see pt?       pls advise

## 2014-09-27 NOTE — Progress Notes (Signed)
   Subjective:    Patient ID: Catherine [name], female    DOB: 1991-04-24, 24 y.o.   MRN: [id number]  HPI Patient seen with rash mostly around her chin region. She's had this for several weeks now. She has a separate patch on her mid forehead. She has some small erythematous papules on the chin and oral region. No pustules. Mild itching. She states that her skin frequently "feels tight " in area of rash. She's tried hydrocortisone cream over-the-counter which does not seem to help. She does use some moisturizing creams as well. These have not helped. She has history of polycystic ovarian syndrome and Hashimoto's thyroiditis.  Past Medical History  Diagnosis Date  . Anxiety     Counseling and psychiatry  . Chronic headaches     Sees specialist  . Thyroid disease   . History of lower GI bleeding   . PUD (peptic ulcer disease)   . IBS (irritable bowel syndrome)   . PCOS (polycystic ovarian syndrome)   . Oligomenorrhea   . Goiter   . Hashimoto's thyroiditis   . Obesity   . Dyspepsia   . Hypothyroidism, acquired, autoimmune   . Fatigue   . History of shingles     face right   . History of recurrent UTIs     Sees urologist  . Hx of varicella    Past Surgical History  Procedure Laterality Date  . Knee surgery      x 2 femoral.  break  sp fall  7th grade   . Band hemorrhoidectomy    . Wisdom tooth extraction    . Knees      reports that she has never smoked. She has never used smokeless tobacco. She reports that she does not drink alcohol or use illicit drugs. family history includes Breast cancer in her maternal grandmother; Cancer in her maternal grandmother; Colon cancer in her maternal grandmother; Colon polyps in her maternal grandmother; Diabetes in her paternal grandfather; Lupus in her paternal grandmother; Obesity in her mother; Osteopenia in her mother; Thyroid disease in her paternal grandmother. Allergies  Allergen Reactions  . Milk-Related Compounds Nausea And Vomiting    . Nsaids   . Penicillins       Review of Systems  Constitutional: Negative for fever and chills.  Skin: Positive for rash.       Objective:   Physical Exam  Constitutional: She appears well-developed and well-nourished.  Cardiovascular: Normal rate and regular rhythm.   Skin:  Patient has some very small pinpoint erythematous papules scattered mostly on the chin and lateral to the corners of mouth. No pustules.  She has small patch proximally 1 cm of scaly rash on her midforehead. This appears somewhat different from the patch below (chin region). Nontender.          Assessment & Plan:  Facial skin rash.  ? Perioral dermatitis. Skin rash on forehead appears somewhat different. We've recommended leaving off moisturizing creams and steroids from around the mouth region. Doxycycline 100 mg twice daily for 10 days. Would consider topical antibiotics pending oral antibiotic response-if this recurs. We wrote for triamcinolone 0.1% cream to try on her midforehead but not the perioral rash

## 2014-09-30 NOTE — Telephone Encounter (Signed)
Pt was seen by thi time i saw this message

## 2014-11-14 ENCOUNTER — Other Ambulatory Visit: Payer: Self-pay | Admitting: *Deleted

## 2014-11-14 DIAGNOSIS — E063 Autoimmune thyroiditis: Secondary | ICD-10-CM

## 2014-11-28 LAB — HEMOGLOBIN A1C
HEMOGLOBIN A1C: 5.2 % (ref <5.7)
Mean Plasma Glucose: 103 mg/dL (ref <117)

## 2014-11-28 LAB — PTH, INTACT AND CALCIUM
Calcium: 9.2 mg/dL (ref 8.4–10.5)
PTH: 50 pg/mL (ref 14–64)

## 2014-11-28 LAB — TSH: TSH: 1.094 u[IU]/mL (ref 0.350–4.500)

## 2014-11-28 LAB — VITAMIN D 25 HYDROXY (VIT D DEFICIENCY, FRACTURES): Vit D, 25-Hydroxy: 34 ng/mL (ref 30–100)

## 2014-11-28 LAB — CALCIUM: CALCIUM: 9.2 mg/dL (ref 8.4–10.5)

## 2014-11-28 LAB — T3, FREE: T3, Free: 2.9 pg/mL (ref 2.3–4.2)

## 2014-11-28 LAB — T4, FREE: Free T4: 1.15 ng/dL (ref 0.80–1.80)

## 2014-12-01 LAB — VITAMIN D 1,25 DIHYDROXY
VITAMIN D 1, 25 (OH) TOTAL: 116 pg/mL — AB (ref 18–72)
Vitamin D2 1, 25 (OH)2: <8 pg/mL
Vitamin D3 1, 25 (OH)2: 116 pg/mL

## 2014-12-02 ENCOUNTER — Ambulatory Visit (INDEPENDENT_AMBULATORY_CARE_PROVIDER_SITE_OTHER): Payer: BLUE CROSS/BLUE SHIELD | Admitting: "Endocrinology

## 2014-12-02 ENCOUNTER — Encounter: Payer: Self-pay | Admitting: "Endocrinology

## 2014-12-02 ENCOUNTER — Other Ambulatory Visit: Payer: Self-pay | Admitting: *Deleted

## 2014-12-02 VITALS — BP 111/76 | HR 76 | Wt 169.0 lb

## 2014-12-02 DIAGNOSIS — E049 Nontoxic goiter, unspecified: Secondary | ICD-10-CM

## 2014-12-02 DIAGNOSIS — R5383 Other fatigue: Secondary | ICD-10-CM

## 2014-12-02 DIAGNOSIS — E063 Autoimmune thyroiditis: Secondary | ICD-10-CM | POA: Diagnosis not present

## 2014-12-02 DIAGNOSIS — R1013 Epigastric pain: Secondary | ICD-10-CM

## 2014-12-02 DIAGNOSIS — E678 Other specified hyperalimentation: Secondary | ICD-10-CM

## 2014-12-02 DIAGNOSIS — E669 Obesity, unspecified: Secondary | ICD-10-CM | POA: Diagnosis not present

## 2014-12-02 MED ORDER — RANITIDINE HCL 150 MG PO TABS: 150.0000 mg | ORAL_TABLET | Freq: Two times a day (BID) | ORAL | Status: DC

## 2014-12-02 NOTE — Patient Instructions (Signed)
Follow up visit in 3 months. Take ranitidine, 150 mg, twice daily.

## 2014-12-02 NOTE — Progress Notes (Signed)
Subjective:  Patient Name: Catherine [name] Date of Birth: [birth year]  MRN: [id number]  Catherine [name]  presents to the office today for follow-up of her oligomenorrhea, PCOS, goiter, Hashimoto's thyroiditis, obesity, nausea and vomiting, dyspepsia, hypothyroidism, fatigue, anxiety and depression.[name]   HISTORY OF PRESENT ILLNESS:   Catherine [name] is a 24 y.o. Caucasian young woman.  Catherine [name] was accompanied by her mother.  1. The patient was first referred to me on 09/09/06 by her gynecologist, Dr. Dian Queen, for evaluation and management of secondary amenorrhea. She was then 24 years old.  A. Patient had been essentially healthy up through age 31-13, when she underwent menarche. Her menstrual cycles had never been regular. Her last normal menstrual period occurred in about 2005. She was not on any oral contraceptives  or contraceptive implants at that time. Another obstetrician put her on oral contraceptives at that point. She gained about 20 pounds in weight in that first year on oral contraceptives. She started evaluation with Dr. Helane Rima in 2007 or so. Dr. Helane Rima put her on Yaz initially, but later converted her to [facility].The patient's past medical history was positive for a lot of depression, mood swings, and panic attacks. She also had 2 knee surgeries in the seventh grade. She did not do any type of routine physical activity. Family history was positive for diabetes in the paternal grandfather and hypothyroidism in the paternal grandmother.  Father had severe depression and anxiety. Maternal grandmother had colon cancer. Mother was obese. Mother also had endometriosis. As I later learned on 07/10/12, one of Catherine [name]'s mother's first cousins had recently developed Addison's Disease.  B. On physical examination, her height was at the 35th percentile and her weight at 177.6 pounds was at the 93rd percentile. Her BMI was 30.7. She had a 25+ gram thyroid gland. She was tender in the right lobe. She also had 1+  acanthosis nigricans. Previous laboratory data from 06/21/06 showed a normal CMP. Her TSH was 2.05 and her free T4 was 1.18. A serum glucose was 91 with a corresponding insulin level of 95, which was very excessive. Laboratory data at that visit included a blood glucose of 79 and hemoglobin A1c of 5.0%. Subsequent lab tests on 09/12/06 showed a fasting glucose of 72 and a fasting insulin of 22, again excessive. It appeared at that time that the patient was mildly obese. Her level of obesity was high enough, however, to cause a significant degree of insulin resistance and hyperinsulinemia. Her level of obesity may also have been high enough to cause oligomenorrhea. She had a goiter and clinical evidence of Hashimoto's disease, but she was euthyroid. I talked with the patient and her mother about our Eat Right Diet plan. I also talked about exercising 45-60 minutes a day. I started her on metformin, 500 mg, twice daily.  2. During the past 8  years, patient's weight has fluctuated up and down, varying between 150.7-179.4 lbs. She has continued to have flare ups of Hashimoto's disease. On 02/12/08 her TSH rose to 4.493, but then subsequently normalized. She has been euthyroid for most of the past 5 years. At times she's been on birth control patches, but most times has been on birth control pills. Her dyspepsia is better when she takes her Nexium and worse when she does not. Her anxiety and depression have been major problems for her. She has also had chronic fatigue, recurrent UTIs, and a myriad of gastrointestinal symptoms.   3. The patient's last [facility] visit was on 05/27/14. In the  interim she has been healthy.   A. In the past weeks she has had problems with fatigue, anxiety, emotional upset, anxiety, depression, insomnia, being nervous and jittery,  and has had more stomach upset. Because she has been working earlier she has been drinking much more caffeine than ever before. She has noted that on her day off she  gets caffeine withdrawal headaches. She is also dealing with more stress at work. She is also taking some allergy medications intermittently.   B. She has also been more anxious and depressed and had a panic attack last week, the first one in sometime. She will see her psychiatrist, Dr. Glori Bickers, this week and will request a review of her medications and counseling with a therapist.   C. She has been walking more and trying to eat healthier. She remains dairy-free, but has been consuming more gluten-containing foods.   D. She has not had any more UTIs, gout, or kidney stones since the Topamax was discontinued.  She still has an occasional menstrual migraine.   E. She takes her multivitamin every day and a calcium/magnesium supplement almost every day.  4. Pertinent Review of Systems:  Constitutional: The patient feels "really great". Her energy level is really good.  Eyes: Vision is good. There are no significant eye complaints. Neck: The patient has no complaints of anterior neck swelling, soreness, tenderness,  pressure, discomfort, or difficulty swallowing.  Heart: She had some faster heart rate when she is anxious. Heart rate increases with exercise or other physical activity. The patient has no other complaints of palpitations, irregular heat beats, chest pain, or chest pressure. Gastrointestinal: As above. She has had fairly constant nausea, diarrhea, and stomach pains for the past month.  She feels hungry, but is sometimes so sick on her stomach that she is almost afraid to eat. The patient has no complaints of excessive hunger, acid reflux, or constipation.  Legs: She no longer has leg cramps. Muscle mass and strength seem normal. There are no complaints of numbness, tingling, burning, or pain. No edema is noted. Feet: There are no obvious foot problems. There are no complaints of numbness, tingling, burning, or pain. No edema is noted. GYN: LMP was a few days ago. She has occasionally had some  left pelvic pains associated with her menses. She has had to change OCPs several times to find one that does not cause adverse effects.    PAST MEDICAL, FAMILY, AND SOCIAL HISTORY:  Past Medical History  Diagnosis Date  . Anxiety     Counseling and psychiatry  . Chronic headaches     Sees specialist  . Thyroid disease   . History of lower GI bleeding   . PUD (peptic ulcer disease)   . IBS (irritable bowel syndrome)   . PCOS (polycystic ovarian syndrome)   . Oligomenorrhea   . Goiter   . Hashimoto's thyroiditis   . Obesity   . Dyspepsia   . Hypothyroidism, acquired, autoimmune   . Fatigue   . History of shingles     face right   . History of recurrent UTIs     Sees urologist  . Hx of varicella     Family History  Problem Relation Age of Onset  . Breast cancer Maternal Grandmother   . Colon cancer Maternal Grandmother   . Colon polyps Maternal Grandmother   . Cancer Maternal Grandmother   . Diabetes Paternal Grandfather   . Lupus Paternal Grandmother   . Thyroid disease Paternal Grandmother  Hypothyroid without surgery or irradiation.  . Osteopenia Mother   . Obesity Mother      Current outpatient prescriptions:  .  ALPRAZolam (XANAX XR) 0.5 MG 24 hr tablet, Take by mouth every morning., Disp: , Rfl:  .  cetirizine (ZYRTEC) 10 MG tablet, Take 10 mg by mouth daily.  , Disp: , Rfl:  .  desvenlafaxine (PRISTIQ) 50 MG 24 hr tablet, Take 50 mg by mouth daily., Disp: , Rfl:  .  EPINEPHrine (EPIPEN) 0.3 mg/0.3 mL DEVI, Inject 0.3 mLs (0.3 mg total) into the muscle as needed., Disp: 2 Device, Rfl: 0 .  Multiple Vitamin (MULTI-VITAMIN PO), Take by mouth., Disp: , Rfl:  .  Norethin-Eth Estradiol-Fe [facility] FE PO), Take by mouth., Disp: , Rfl:  .  norethindrone-ethinyl estradiol (OVCON-35,BALZIVA,BRIELLYN) 0.4-35 MG-MCG tablet, Take 1 tablet by mouth daily., Disp: , Rfl:  .  Probiotic Product (PROBIOTIC PO), Take by mouth., Disp: , Rfl:  .  doxycycline (VIBRAMYCIN) 100 MG  capsule, Take 1 capsule (100 mg total) by mouth 2 (two) times daily. (Patient not taking: Reported on 12/02/2014), Disp: 20 capsule, Rfl: 2 .  HYDROcodone-acetaminophen (NORCO/VICODIN) 5-325 MG per tablet, Take 1 tablet by mouth every 4 (four) hours as needed for moderate pain. (Patient not taking: Reported on 12/02/2014), Disp: 20 tablet, Rfl: 0 .  loperamide (IMODIUM A-D) 2 MG tablet, Take as directed (Patient not taking: Reported on 12/02/2014), Disp: 1 tablet, Rfl: 0 .  triamcinolone cream (KENALOG) 0.1 %, Apply 1 application topically 2 (two) times daily. (Patient not taking: Reported on 12/02/2014), Disp: 30 g, Rfl: 0  Allergies as of 12/02/2014 - Review Complete 12/02/2014  Allergen Reaction Noted  . Milk-related compounds Nausea And Vomiting 01/20/2013  . Nsaids  03/29/2011  . Penicillins  07/24/2011    1. 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  2. Activities: She does a lot of physical activity at work. 3. Smoking, alcohol, or drugs: None 4. Primary Care Provider: Dr. Shanon Ace, MD 5. Psychiatrist: Dr. Chucky May, MD - phone [phone], fax [phone] 6. GYN: Dr. Dian Queen, MD  REVIEW OF SYSTEMS: There are no other significant problems involving Catherine [name]'s other body systems.   Objective:  Vital Signs:  BP 111/76 mmHg  Pulse 76  Wt 169 lb (76.658 kg)    Ht Readings from Last 3 Encounters:  05/31/14 5' 4.5" (1.638 m)  05/13/14 '5\' 6"'  (1.676 m)  10/15/13 5' 4.75" (1.645 m)   Wt Readings from Last 3 Encounters:  12/02/14 169 lb (76.658 kg)  09/27/14 175 lb (79.379 kg)  05/31/14 175 lb (79.379 kg)   There is no height on file to calculate BSA.  Normalized stature-for-age data available only for age 69 to 37 years. Normalized weight-for-age data available only for age 69 to 20 years.  PHYSICAL EXAM:  Constitutional: Catherine [name] is bright and upbeat today, but did exhibit some sadness when discussing her  emotional issues. She has lost another 6 pounds in the past 6 months. She looks slimmer. Face: The face appears normal.  Eyes: There is no obvious arcus or proptosis. Moisture appears normal. Mouth: The oropharynx and tongue appear normal. Dentition appears to be normal for age. Oral moisture is normal. She has no hyperpigmentation of the oral mucosa. Neck: The neck appears to be visibly normal. No carotid bruits are noted. The thyroid gland is mildly enlarged at  about 21-22 grams in size. Her thyroid isthmus  is relatively large. The lobes are minimally enlarged. The consistency of the thyroid gland is normal. The thyroid gland is very mildly tender in the mid-lobes today.  Lungs: The lungs are clear to auscultation. Air movement is good. Heart: Heart rate and rhythm are regular. Heart sounds S1 and S2 are normal. I did not appreciate any pathologic cardiac murmurs. Abdomen: The abdomen is somewhat enlarged. Bowel sounds are normal. There is no obvious hepatomegaly, splenomegaly, or other mass effect. She has some tenderness in the epigastrium/LUQ.  Arms: Muscle size and bulk are normal for age. Hands: There is no obvious tremor. Phalangeal and metacarpophalangeal joints are normal. Palmar muscles are normal. Palmar skin is normal. Palmar moisture is also normal. Nails are normally pink. There is no palmar hyperpigmentation.  Legs: Muscles appear normal for age. No edema is present. Neurologic: Strength is normal for age in both the upper and lower extremities. Muscle tone is normal. Sensation to touch is normal in both legs.     LAB DATA:   Labs 11/27/14: HbA1c 5.2%; calcium 9.2, PTH 50, 25-OH vitamin D 34, 1,25-dihydroxy vitamin D 116;  TSH 1.094, free T4 1.15, free T3 2.9;   Labs 05/23/14: TSH 0.570, free T4 1.10, free T3 3.3; HbA1c 4.8%; calcium 9.8, PTH 36, 25-hydroxy vitamin D 56  Labs 07/09/13: 25-hydroxy Vitamin D 31; calcium 9.2, PTH 64.3;   Labs 08/30/13; TSH 0.778, free T4 1.17, free  T3  3.4, TSI 43  Labs 10/23/12: TSH 0.812, free T4 1.02, free T3 2.9; iron 114, 25-hydroxy vitamin D 20, calcium 9.5, PTH 40.6  Labs 07/04/12 at 9 AM: ACTH 24, cortisol 17.3  labs 02/18/12: TSH 1.208, free T4, free T3 3.0  Bone mineral density 09/22/12: Osteopenia of the spine based upon T-score for age   Assessment and Plan:   ASSESSMENT:  1. Thyroiditis: The patient's Hashimoto's disease is active again today, but this time in both lobes. She was chemically euthyroid in March 2014, January 2015, October 2015,  and now again in April 2016. She appears to be clinically euthyroid. The shift of all 3 TFTs downward together from January to October 2015 is pathognomonic for a flare up of Hashimoto's disease. 2. Goiter: The thyroid gland is about the same size overall today, but the lobes have shifted in size. The waxing and waning of thyroid gland size shows that she has more thyroiditis at some times and less at other times.  3. Obesity: She has continued to lose weight. She is eating healthier. She would also benefit from more exercise. Part of her weight loss has been due to GI symptoms. 4. Dyspepsia: This problem has worsened, in part due to caffeine and stress, and perhaps due to celiac disease. Her diarrhea could be due to celiac disease.   5. Fatigue: She was doing much better, but is now again quite fatigued. . 6. Anxiety and depression: She is not doing quite as well now.  She needs more joy in her life.  7. Chronic/recurrent/frequent infectious/inflammatory disease: She had been free of these problems for the past 6-12 months since eliminating gluten and dairy.   8. Vitamin D excess: Her 1,25 vitamin D is high, paralleling her increase in PTH and inverse to her vitamin D level.   PLAN:  1. Diagnostic:  The following blood tests will be drawn prior to next visit: 25-OH vitamin D, PTH, calcium,1,25-dihydroxy vitamin D, and TFTs. 2. Therapeutic: Take one good MVI per day, either Centrum for  Women or One-A-Day for  Women. Continue the calcium, magnesium, and vitamin D supplements. Get an hour or more of exercise per day. Add ranitidine, 150 mg, twice daily. Reduce caffeine by 20% per week. Resume gluten-free diet.  3. Patient education: We discussed vitamin D deficiency, secondary hyperparathyroidism, thyroiditis, dyspepsia,  and her anxiety and depression.   4. Follow-up: 3 months  Level of Service: This visit lasted in excess of 55 minutes. More than 50% of the visit was devoted to counseling.  Sherrlyn Hock, MD 12/02/2014 1:55 PM

## 2014-12-05 ENCOUNTER — Encounter (HOSPITAL_COMMUNITY): Payer: Self-pay | Admitting: Family Medicine

## 2014-12-05 ENCOUNTER — Emergency Department (HOSPITAL_COMMUNITY)
Admission: EM | Admit: 2014-12-05 | Discharge: 2014-12-05 | Disposition: A | Discharge status: Home / Self Care | Payer: BLUE CROSS/BLUE SHIELD | Source: Home / Self Care | Attending: Family Medicine | Admitting: Family Medicine

## 2014-12-05 DIAGNOSIS — K589 Irritable bowel syndrome without diarrhea: Secondary | ICD-10-CM

## 2014-12-05 DIAGNOSIS — A084 Viral intestinal infection, unspecified: Secondary | ICD-10-CM

## 2014-12-05 DIAGNOSIS — E860 Dehydration: Secondary | ICD-10-CM | POA: Diagnosis not present

## 2014-12-05 DIAGNOSIS — R000 Tachycardia, unspecified: Secondary | ICD-10-CM

## 2014-12-05 LAB — POCT I-STAT, CHEM 8
BUN: 20 mg/dL (ref 6–23)
CALCIUM ION: 1.16 mmol/L (ref 1.12–1.23)
Chloride: 103 mmol/L (ref 96–112)
Creatinine, Ser: 1 mg/dL (ref 0.50–1.10)
Glucose, Bld: 154 mg/dL — ABNORMAL HIGH (ref 70–99)
HEMATOCRIT: 53 % — AB (ref 36.0–46.0)
Hemoglobin: 18 g/dL — ABNORMAL HIGH (ref 12.0–15.0)
Potassium: 4.1 mmol/L (ref 3.5–5.1)
Sodium: 139 mmol/L (ref 135–145)
TCO2: 22 mmol/L (ref 0–100)

## 2014-12-05 MED ORDER — ONDANSETRON HCL 4 MG/2ML IJ SOLN: 4.0000 mg | Freq: Once | INTRAMUSCULAR | Status: DC

## 2014-12-05 MED ORDER — ONDANSETRON HCL 4 MG/2ML IJ SOLN
4.0000 mg | Freq: Once | INTRAMUSCULAR | Status: AC
  Administered 2014-12-05: 4 mg via INTRAVENOUS

## 2014-12-05 MED ORDER — ONDANSETRON HCL 4 MG/2ML IJ SOLN
INTRAMUSCULAR | Status: AC
  Filled 2014-12-05: qty 2

## 2014-12-05 MED ORDER — ONDANSETRON 4 MG PO TBDP: 4.0000 mg | ORAL_TABLET | Freq: Three times a day (TID) | ORAL | Status: DC | PRN

## 2014-12-05 MED ORDER — SODIUM CHLORIDE 0.9 % IV BOLUS (SEPSIS)
1000.0000 mL | Freq: Once | INTRAVENOUS | Status: AC
  Administered 2014-12-05: 1000 mL via INTRAVENOUS

## 2014-12-05 NOTE — Discharge Instructions (Signed)
You were likely experiencing the compounded effects of viral gastroenteritis, a got infection, and in IBS flare. You were given 1 L of normal saline as well as some IV Zofran for your symptoms. Please continue using the Zofran as needed and try to get plenty of rest and stay well hydrated. If he gets significantly more dehydrated and become more ill please go to the emergency room. This should start to improve in the next 1-3 days.

## 2014-12-05 NOTE — ED Provider Notes (Signed)
CSN: [id number]     Arrival date & time 12/05/14  1359 History   First MD Initiated Contact with Patient 12/05/14 1424     No chief complaint on file.  (Consider location/radiation/quality/duration/timing/severity/associated sxs/prior Treatment) HPI  Vomiting started at 05:00 today. Approximately every pt throws up. Nothing makes it better. Nauseen tablets w/o benefit. Non-bloody emesis. Associated w/ subjective fevers and chills. Multiple bouts of diarrhea during this time as well. Last bout of diarrhea 3 hrs ago. No fluids able to stay down.  Recent flare in IBS. Started 4 weeks ago which is nml for pt.     Past Medical History  Diagnosis Date  . Anxiety     Counseling and psychiatry  . Chronic headaches     Sees specialist  . Thyroid disease   . History of lower GI bleeding   . PUD (peptic ulcer disease)   . IBS (irritable bowel syndrome)   . PCOS (polycystic ovarian syndrome)   . Oligomenorrhea   . Goiter   . Hashimoto's thyroiditis   . Obesity   . Dyspepsia   . Hypothyroidism, acquired, autoimmune   . Fatigue   . History of shingles     face right   . History of recurrent UTIs     Sees urologist  . Hx of varicella    Past Surgical History  Procedure Laterality Date  . Knee surgery      x 2 femoral.  break  sp fall  7th grade   . Band hemorrhoidectomy    . Wisdom tooth extraction    . Knees     Family History  Problem Relation Age of Onset  . Breast cancer Maternal Grandmother   . Colon cancer Maternal Grandmother   . Colon polyps Maternal Grandmother   . Cancer Maternal Grandmother   . Diabetes Paternal Grandfather   . Lupus Paternal Grandmother   . Thyroid disease Paternal Grandmother     Hypothyroid without surgery or irradiation.  . Osteopenia Mother   . Obesity Mother    History  Substance Use Topics  . Smoking status: Never Smoker   . Smokeless tobacco: Never Used  . Alcohol Use: No   OB History    No data available     Review of  Systems Per HPI with all other pertinent systems negative.   Allergies  Milk-related compounds; Nsaids; and Penicillins  Home Medications   Prior to Admission medications   Medication Sig Start Date End Date Taking? Authorizing Provider  ALPRAZolam (XANAX XR) 0.5 MG 24 hr tablet Take by mouth every morning.    Historical Provider, MD  cetirizine (ZYRTEC) 10 MG tablet Take 10 mg by mouth daily.      Historical Provider, MD  desvenlafaxine (PRISTIQ) 50 MG 24 hr tablet Take 50 mg by mouth daily.    Historical Provider, MD  EPINEPHrine (EPIPEN) 0.3 mg/0.3 mL DEVI Inject 0.3 mLs (0.3 mg total) into the muscle as needed. 01/21/13   Jones Skene, MD  loperamide (IMODIUM A-D) 2 MG tablet Take as directed Patient not taking: Reported on 12/02/2014 06/21/11   Hilarie Fredrickson, MD  Multiple Vitamin (MULTI-VITAMIN PO) Take by mouth.    Historical Provider, MD  Norethin-Eth Estradiol-Fe [facility] FE PO) Take by mouth.    Historical Provider, MD  norethindrone-ethinyl estradiol (OVCON-35,BALZIVA,BRIELLYN) 0.4-35 MG-MCG tablet Take 1 tablet by mouth daily.    Historical Provider, MD  ondansetron (ZOFRAN-ODT) 4 MG disintegrating tablet Take 1 tablet (4 mg total) by mouth  every 8 (eight) hours as needed for nausea or vomiting. 12/05/14   Ozella Rocksavid J Kayla Weekes, MD  Probiotic Product (PROBIOTIC PO) Take by mouth.    Historical Provider, MD  ranitidine (ZANTAC) 150 MG tablet Take 1 tablet (150 mg total) by mouth 2 (two) times daily. 12/02/14   Tiegan Jambor StallMichael J Brennan, MD  triamcinolone cream (KENALOG) 0.1 % Apply 1 application topically 2 (two) times daily. Patient not taking: Reported on 12/02/2014 09/27/14   Kristian CoveyBruce W Burchette, MD   BP 115/79 mmHg  Pulse 117  Temp(Src) 97.8 F (36.6 C) (Oral)  Resp 16  SpO2 96% Physical Exam Physical Exam  Constitutional: oriented to person, place, and time. appears well-developed and well-nourished. No significant distress. HENT:  Head: Normocephalic and atraumatic.  Eyes: EOMI. PERRL.   Neck: Normal range of motion.  Cardiovascular: RRR, no m/r/g, 2+ distal pulses,  Pulmonary/Chest: Effort normal and breath sounds normal. No respiratory distress.  Abdominal: Soft. Bowel sounds are normal. NonTTP, no distension.  Musculoskeletal: Normal range of motion. Non ttp, no effusion.  Neurological: alert and oriented to person, place, and time.  Skin: Skin is warm. No rash noted. non diaphoretic.  Psychiatric: normal mood and affect. behavior is normal. Judgment and thought content normal.   ED Course  Procedures (including critical care time) Labs Review Labs Reviewed  POCT I-STAT, CHEM 8 - Abnormal; Notable for the following:    Glucose, Bld 154 (*)    Hemoglobin 18.0 (*)    HCT 53.0 (*)    All other components within normal limits    Imaging Review No results found.   MDM   1. IBS (irritable bowel syndrome)   2. Viral gastroenteritis   3. Dehydration   4. Tachycardia    IV normal saline 1 L bolus and IV Zofran 4 mg given with significant improvement in condition. Patient given prescription for continued Zofran dosing at home. Patient looking stable for discharge but given stricture per return precautions. Patient's symptoms are likely due to the compounded effects of a viral gastroenteritis as well as an IBS flare.   Ozella Rocksavid J Louis Gaw, MD 12/05/14 [phone]

## 2014-12-05 NOTE — ED Notes (Signed)
See provider's note

## 2015-03-10 ENCOUNTER — Ambulatory Visit: Payer: BLUE CROSS/BLUE SHIELD | Admitting: "Endocrinology

## 2015-04-11 ENCOUNTER — Other Ambulatory Visit: Payer: Self-pay | Admitting: "Endocrinology

## 2015-04-12 LAB — TESTOS,TOTAL,FREE AND SHBG (FEMALE)

## 2015-04-12 LAB — TSH: TSH: 0.91 u[IU]/mL (ref 0.350–4.500)

## 2015-04-12 LAB — T3, FREE: T3, Free: 2.9 pg/mL (ref 2.3–4.2)

## 2015-04-12 LAB — VITAMIN D 25 HYDROXY (VIT D DEFICIENCY, FRACTURES): Vit D, 25-Hydroxy: 38 ng/mL (ref 30–100)

## 2015-04-12 LAB — T4, FREE: FREE T4: 0.89 ng/dL (ref 0.80–1.80)

## 2015-04-14 LAB — TESTOSTERONE, FREE, TOTAL, SHBG
Sex Hormone Binding: 205 nmol/L — ABNORMAL HIGH (ref 17–124)
TESTOSTERONE: 35 ng/dL (ref 10–70)
Testosterone, Free: 1.5 pg/mL (ref 0.6–6.8)
Testosterone-% Free: 0.4 % (ref 0.4–2.4)

## 2015-04-14 LAB — PTH, INTACT AND CALCIUM
CALCIUM: 9.3 mg/dL (ref 8.4–10.5)
PTH: 33 pg/mL (ref 14–64)

## 2015-04-16 ENCOUNTER — Encounter: Payer: Self-pay | Admitting: "Endocrinology

## 2015-04-16 ENCOUNTER — Ambulatory Visit (INDEPENDENT_AMBULATORY_CARE_PROVIDER_SITE_OTHER): Payer: BLUE CROSS/BLUE SHIELD | Admitting: "Endocrinology

## 2015-04-16 VITALS — BP 115/80 | HR 82 | Wt 165.0 lb

## 2015-04-16 DIAGNOSIS — F418 Other specified anxiety disorders: Secondary | ICD-10-CM

## 2015-04-16 DIAGNOSIS — E049 Nontoxic goiter, unspecified: Secondary | ICD-10-CM | POA: Diagnosis not present

## 2015-04-16 DIAGNOSIS — E669 Obesity, unspecified: Secondary | ICD-10-CM | POA: Diagnosis not present

## 2015-04-16 DIAGNOSIS — R1013 Epigastric pain: Secondary | ICD-10-CM | POA: Diagnosis not present

## 2015-04-16 DIAGNOSIS — F419 Anxiety disorder, unspecified: Secondary | ICD-10-CM

## 2015-04-16 DIAGNOSIS — E063 Autoimmune thyroiditis: Secondary | ICD-10-CM | POA: Diagnosis not present

## 2015-04-16 DIAGNOSIS — E559 Vitamin D deficiency, unspecified: Secondary | ICD-10-CM

## 2015-04-16 DIAGNOSIS — F329 Major depressive disorder, single episode, unspecified: Secondary | ICD-10-CM

## 2015-04-16 DIAGNOSIS — F32A Depression, unspecified: Secondary | ICD-10-CM

## 2015-04-16 DIAGNOSIS — R5383 Other fatigue: Secondary | ICD-10-CM

## 2015-04-16 NOTE — Patient Instructions (Signed)
Follow up visit in 4 months. Please repeat labs two weeks prior.

## 2015-04-16 NOTE — Progress Notes (Signed)
Subjective:  Patient Name: Catherine [name] Date of Birth: 1991-02-12  MRN: [id number]  Catherine [name]  presents to the office today for follow-up of her oligomenorrhea, PCOS, goiter, Hashimoto's thyroiditis, obesity, nausea and vomiting, dyspepsia, hypothyroidism, fatigue, anxiety and depression.[name]   HISTORY OF PRESENT ILLNESS:   Catherine [name] is a 24 y.o. Caucasian young woman.  Catherine [name] was accompanied by her mother.  1. The patient was first referred to me on 09/09/06 by her gynecologist, Dr. Dian Queen, for evaluation and management of secondary amenorrhea. She was then 24 years old.  A. Patient had been essentially healthy up through age 14-13, when she underwent menarche. Her menstrual cycles had never been regular. Her last normal menstrual period occurred in about 2005. She was not on any oral contraceptives  or contraceptive implants at that time. Another obstetrician put her on oral contraceptives at that point. She gained about 20 pounds in weight in that first year on oral contraceptives. She started evaluation with Dr. Helane Rima in 2007 or so. Dr. Helane Rima put her on Yaz initially, but later converted her to [facility].The patient's past medical history was positive for a lot of depression, mood swings, and panic attacks. She also had 2 knee surgeries in the seventh grade. She did not do any type of routine physical activity. Family history was positive for diabetes in the paternal grandfather and hypothyroidism in the paternal grandmother.  Father had severe depression and anxiety. Maternal grandmother had colon cancer. Mother was obese. Mother also had endometriosis. As I later learned on 07/10/12, one of Catherine [name]'s mother's first cousins had recently developed Addison's Disease.  B. On physical examination, her height was at the 35th percentile and her weight at 177.6 pounds was at the 93rd percentile. Her BMI was 30.7. She had a 25+ gram thyroid gland. She was tender in the right lobe. She also had 1+  acanthosis nigricans. Previous laboratory data from 06/21/06 showed a normal CMP. Her TSH was 2.05 and her free T4 was 1.18. A serum glucose was 91 with a corresponding insulin level of 95, which was very excessive. Laboratory data at that visit included a blood glucose of 79 and hemoglobin A1c of 5.0%. Subsequent lab tests on 09/12/06 showed a fasting glucose of 72 and a fasting insulin of 22, again excessive. It appeared at that time that the patient was mildly obese. Her level of obesity was high enough, however, to cause a significant degree of insulin resistance and hyperinsulinemia. Her level of obesity may also have been high enough to cause oligomenorrhea. She had a goiter and clinical evidence of Hashimoto's disease, but she was euthyroid. I talked with the patient and her mother about our Eat Right Diet plan. I also talked about exercising 45-60 minutes a day. I started her on metformin, 500 mg, twice daily.  2. During the past 8  years, patient's weight has fluctuated up and down, varying between 150.7-179.4 lbs. She has continued to have flare ups of Hashimoto's disease. On 02/12/08 her TSH rose to 4.493, but then subsequently normalized. She has been euthyroid for most of the past 5 years. At times she's been on birth control patches, but most times has been on birth control pills. Her dyspepsia is better when she takes her Nexium and worse when she does not. Her anxiety and depression have been major problems for her. She has also had chronic fatigue, recurrent UTIs, and a myriad of gastrointestinal symptoms.   3. The patient's last [facility] visit was on 12/02/14. In the  interim she has been healthy.   A. She has had more personal stresses, is living at home with her parents again, and is back in therapy again. Her anti-depressant, Pristiq, has been increased and then decreased. The higher antidepressant dose caused insomnia and abnormal sleep. She has also been having intermittent diarhea and chronic  abdominal pains again. She has been taking ranitidine once daily. Her weight has fluctuated variably. She is now eating more cautiously again, but admits to "comfort eating" when stressed. She is still drinking too much caffeine, which in the past has caused significant problems with gastrointestinal upset, excess gastric acid secretion, insomnia, and poor sleep quality.  She is also dealing with more stress at work. She is also taking some allergy medications intermittently.   B. She has not had any more UTIs, gout, or kidney stones since the Topamax was discontinued.  She still has an occasional menstrual migraine.   C. She takes her multivitamin every day and a calcium/vitamin D supplement almost every day.  4. Pertinent Review of Systems:  Constitutional: The patient feels "overall good, but having some GI discomfort". Her energy level is pretty low.  Eyes: Vision is good. There are no significant eye complaints. Neck: The patient has no complaints of anterior neck swelling, soreness, tenderness,  pressure, discomfort, or difficulty swallowing.  Heart: She had some faster heart rate when she is anxious. Heart rate increases with exercise or other physical activity. The patient has no other complaints of palpitations, irregular heat beats, chest pain, or chest pressure. Gastrointestinal: As above. She has had intermittent nausea, diarrhea, and stomach pains for the past month.  She feels hungry (belly hunger), but is sometimes so sick to her stomach that she is almost afraid to eat. The patient has no complaints of excessive hunger, acid reflux, or constipation.  Legs: She no longer has leg cramps. Muscle mass and strength seem normal. There are no complaints of numbness, tingling, burning, or pain. No edema is noted. Feet: There are no obvious foot problems. There are no complaints of numbness, tingling, burning, or pain. No edema is noted. GYN: LMP was about 4 weeks ago. She has occasionally had  some left pelvic pains associated with her menses. She has had to change OCPs several times to find one that does not cause too many adverse effects.    PAST MEDICAL, FAMILY, AND SOCIAL HISTORY:  Past Medical History  Diagnosis Date  . Anxiety     Counseling and psychiatry  . Chronic headaches     Sees specialist  . Thyroid disease   . History of lower GI bleeding   . PUD (peptic ulcer disease)   . IBS (irritable bowel syndrome)   . PCOS (polycystic ovarian syndrome)   . Oligomenorrhea   . Goiter   . Hashimoto's thyroiditis   . Obesity   . Dyspepsia   . Hypothyroidism, acquired, autoimmune   . Fatigue   . History of shingles     face right   . History of recurrent UTIs     Sees urologist  . Hx of varicella     Family History  Problem Relation Age of Onset  . Breast cancer Maternal Grandmother   . Colon cancer Maternal Grandmother   . Colon polyps Maternal Grandmother   . Cancer Maternal Grandmother   . Diabetes Paternal Grandfather   . Lupus Paternal Grandmother   . Thyroid disease Paternal Grandmother     Hypothyroid without surgery or irradiation.  . Osteopenia  Mother   . Obesity Mother      Current outpatient prescriptions:  .  ALPRAZolam (XANAX XR) 0.5 MG 24 hr tablet, Take by mouth every morning., Disp: , Rfl:  .  Calcium Carbonate-Vitamin D (CALCIUM 500 + D PO), Take by mouth., Disp: , Rfl:  .  cetirizine (ZYRTEC) 10 MG tablet, Take 10 mg by mouth daily.  , Disp: , Rfl:  .  desvenlafaxine (PRISTIQ) 50 MG 24 hr tablet, Take 50 mg by mouth daily., Disp: , Rfl:  .  EPINEPHrine (EPIPEN) 0.3 mg/0.3 mL DEVI, Inject 0.3 mLs (0.3 mg total) into the muscle as needed., Disp: 2 Device, Rfl: 0 .  ferrous sulfate 325 (65 FE) MG EC tablet, Take 325 mg by mouth 3 (three) times daily with meals., Disp: , Rfl:  .  loperamide (IMODIUM A-D) 2 MG tablet, Take as directed, Disp: 1 tablet, Rfl: 0 .  Multiple Vitamin (MULTI-VITAMIN PO), Take by mouth., Disp: , Rfl:  .   Norethin-Eth Estradiol-Fe [facility] FE PO), Take by mouth., Disp: , Rfl:  .  norethindrone-ethinyl estradiol (OVCON-35,BALZIVA,BRIELLYN) 0.4-35 MG-MCG tablet, Take 1 tablet by mouth daily., Disp: , Rfl:  .  Probiotic Product (PROBIOTIC PO), Take by mouth., Disp: , Rfl:  .  ranitidine (ZANTAC) 150 MG tablet, Take 1 tablet (150 mg total) by mouth 2 (two) times daily., Disp: 60 tablet, Rfl: 6 .  ondansetron (ZOFRAN-ODT) 4 MG disintegrating tablet, Take 1 tablet (4 mg total) by mouth every 8 (eight) hours as needed for nausea or vomiting. (Patient not taking: Reported on 04/16/2015), Disp: 20 tablet, Rfl: 0 .  triamcinolone cream (KENALOG) 0.1 %, Apply 1 application topically 2 (two) times daily. (Patient not taking: Reported on 12/02/2014), Disp: 30 g, Rfl: 0  Allergies as of 04/16/2015 - Review Complete 04/16/2015  Allergen Reaction Noted  . Milk-related compounds Nausea And Vomiting 01/20/2013  . Nsaids  03/29/2011  . Penicillins  07/24/2011    1. 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  2. Activities: She does a lot of physical activity at work. 3. Smoking, alcohol, or drugs: None 4. Primary Care Provider: Dr. Shanon Ace, MD 5. Psychiatrist: Dr. Chucky May, MD - phone [phone], fax [phone] 6. GYN: Dr. Dian Queen, MD  REVIEW OF SYSTEMS: There are no other significant problems involving Kinlee's other body systems.   Objective:  Vital Signs:  BP 115/80 mmHg  Pulse 82  Wt 165 lb (74.844 kg)    Ht Readings from Last 3 Encounters:  05/31/14 5' 4.5" (1.638 m)  05/13/14 _0  (1.676 m)  10/15/13 5' 4.75" (1.645 m)   Wt Readings from Last 3 Encounters:  04/16/15 165 lb (74.844 kg)  12/02/14 169 lb (76.658 kg)  09/27/14 175 lb (79.379 kg)   There is no height on file to calculate BSA.  Normalized stature-for-age data available only for age 36 to 26 years. Normalized weight-for-age data available only for age 36 to  20 years.  PHYSICAL EXAM:  Constitutional: Bryton is fairly bright and upbeat today. She had a fairly positive review of systems. She has lost another 4 pounds in the past 6 months. She looks a bit slimmer. Face: The face appears normal.  Eyes: There is no obvious arcus or proptosis. Moisture appears normal. Mouth: The oropharynx and tongue appear normal. Dentition appears to be normal for age. Oral moisture is normal. She has no hyperpigmentation of the oral mucosa. Neck: The  neck appears to be visibly normal. No carotid bruits are noted. The thyroid gland is mildly enlarged at  about 21-22 grams in size. Her thyroid isthmus is relatively large. The lobes are minimally enlarged. The consistency of the thyroid gland is normal. The thyroid gland is not tender to palpation today. She has 1-2+ acanthosis nigricans.  Lungs: The lungs are clear to auscultation. Air movement is good. Heart: Heart rate and rhythm are regular. Heart sounds S1 and S2 are normal. I did not appreciate any pathologic cardiac murmurs. Abdomen: The abdomen is somewhat enlarged. Bowel sounds are normal. There is no obvious hepatomegaly, splenomegaly, or other mass effect. She has some tenderness diffusely in the abdomen.   Arms: Muscle size and bulk are normal for age. Hands: There is no obvious tremor. Phalangeal and metacarpophalangeal joints are normal. Palmar muscles are normal. Palmar skin is normal. Palmar moisture is also normal. Nails are normally pink. There is no palmar hyperpigmentation.  Legs: Muscles appear normal for age. No edema is present. Neurologic: Strength is normal for age in both the upper and lower extremities. Muscle tone is normal. Sensation to touch is normal in both legs.     LAB DATA:   Labs 04/11/15: TSH 0.910, free T4 0.89, free T3 2.9; testosterone 35; 25-OH vitamin D 38  Labs 11/27/14: HbA1c 5.2%; calcium 9.2, PTH 50, 25-OH vitamin D 34, 1,25-dihydroxy vitamin D 116;  TSH 1.094, free T4 1.15,  free T3 2.9;   Labs 05/23/14: TSH 0.570, free T4 1.10, free T3 3.3; HbA1c 4.8%; calcium 9.8, PTH 36, 25-hydroxy vitamin D 56  Labs 07/09/13: 25-hydroxy Vitamin D 31; calcium 9.2, PTH 64.3;   Labs 08/30/13; TSH 0.778, free T4 1.17, free T3  3.4, TSI 43  Labs 10/23/12: TSH 0.812, free T4 1.02, free T3 2.9; iron 114, 25-hydroxy vitamin D 20, calcium 9.5, PTH 40.6  Labs 07/04/12 at 9 AM: ACTH 24, cortisol 17.3  labs 02/18/12: TSH 1.208, free T4, free T3 3.0  Bone mineral density 09/22/12: Osteopenia of the spine based upon T-score for age   Assessment and Plan:   ASSESSMENT:  1. Thyroiditis: The patient's Hashimoto's disease is clinically quiescent today. However, the fact that both her TSH and free T4 have decreased in parallel from April to August indicates that she has had a recent flare up of Hashimoto's thyroiditis. The shift of all 3 TFTs downward together from January to October 2015 was also pathognomonic for a flare up of Hashimoto's disease at that time. 2. Goiter: The thyroid gland is about the same size overall today.  The process of waxing and waning of thyroid gland size shows that she has more thyroiditis at some times and less at other times.  3. Obesity: She has continued to lose weight. She sometimes eats healthier, sometimes not, but really needs to eat healthier all the time. She would also benefit from more exercise. Part of her weight loss has been due to GI symptoms. 4. Dyspepsia: This problem has worsened, in part due to caffeine and stress, and perhaps due to celiac disease. Her diarrhea could be due to celiac disease.   5. Fatigue: She was doing much better, but is now again quite fatigued.  6. Anxiety and depression: She is not doing quite as well now.  She needs more joy in her life.  7. Vitamin D deficiency/excess: Her 25-OH vitamin D level is good.    PLAN:  1. Diagnostic:  TFT, TP0 antibody, anti-thyroglobulin antibody, PTH, calcium, 25-OH vitamin  D prior to next  visit 2. Therapeutic: Take one good MVI per day, either Centrum for Women or One-A-Day for Women. Continue the calcium, magnesium, and vitamin D supplements. Get an hour or more of exercise per day. Take ranitidine, 150 mg, twice daily. Reduce caffeine by 25% per week for three weeks, then hold at the lower level of caffeine consumption.  3. Patient education: We discussed vitamin D deficiency, secondary hyperparathyroidism, thyroiditis, dyspepsia,  and her anxiety and depression.  I recommended Dr. Juanita Craver and Dr. Mikki Santee Buccini as GI specialists for her if Dr. Regis Bill feels that such a referral is indicated.   4. Follow-up: 4 months  Level of Service: This visit lasted in excess of 65 minutes. More than 50% of the visit was devoted to counseling.  Sherrlyn Hock, MD 04/16/2015 10:44 AM

## 2015-04-17 ENCOUNTER — Encounter: Payer: Self-pay | Admitting: *Deleted

## 2015-07-13 ENCOUNTER — Other Ambulatory Visit: Payer: Self-pay | Admitting: "Endocrinology

## 2015-07-22 ENCOUNTER — Other Ambulatory Visit: Payer: Self-pay | Admitting: Internal Medicine

## 2015-08-15 ENCOUNTER — Telehealth: Payer: Self-pay | Admitting: Internal Medicine

## 2015-08-15 LAB — THYROGLOBULIN ANTIBODY PANEL
THYROID PEROXIDASE ANTIBODY: 1 [IU]/mL (ref <9)
Thyroglobulin: 5.6 ng/mL (ref 2.8–40.9)

## 2015-08-15 LAB — VITAMIN D 25 HYDROXY (VIT D DEFICIENCY, FRACTURES): VIT D 25 HYDROXY: 44 ng/mL (ref 30–100)

## 2015-08-15 LAB — TSH: TSH: 0.505 u[IU]/mL (ref 0.350–4.500)

## 2015-08-15 LAB — PTH, INTACT AND CALCIUM
CALCIUM: 9.4 mg/dL (ref 8.4–10.5)
PTH: 32 pg/mL (ref 14–64)

## 2015-08-15 LAB — T3, FREE: T3 FREE: 2.7 pg/mL (ref 2.3–4.2)

## 2015-08-15 LAB — T4, FREE: Free T4: 1.24 ng/dL (ref 0.80–1.80)

## 2015-08-15 NOTE — Telephone Encounter (Signed)
Patient Name: Catherine [name] DOB: May 29, 1991 Initial Comment caller states she has right knee pain and swelling after it cracked while walking around at work a week ago Nurse Assessment Nurse: Charna Elizabethrumbull, RN, Lynden Angathy Date/Time (Eastern Time): 08/15/2015 9:14:28 AM Confirm and document reason for call. If symptomatic, describe symptoms. ---Caller states she developed right knee pain about a week ago. No injury. No fever. Has the patient traveled out of the country within the last 30 days? ---No Does the patient have any new or worsening symptoms? ---Yes Will a triage be completed? ---Yes Related visit to physician within the last 2 weeks? ---No Does the PT have any chronic conditions? (i.e. diabetes, asthma, etc.) ---Yes List chronic conditions. ---Hashimotos, PCOS, IBS, Osteoporosis Did the patient indicate they were pregnant? ---No Is this a behavioral health or substance abuse call? ---No Guidelines Guideline Title Affirmed Question Affirmed Notes Knee Pain [1] Thigh or calf pain AND [2] only 1 side AND [3] present > 1 hour Final Disposition User See Physician within 4 Hours (or PCP triage) Charna Elizabethrumbull, RN, [name] Referrals [facility] Disagree/Comply: Comply

## 2015-08-15 NOTE — Telephone Encounter (Signed)
Noted  

## 2015-08-15 NOTE — Telephone Encounter (Signed)
Patient Name: Catherine RobsonKRISTEN [name] DOB: May 22, 1991 Initial Comment Caller states she spoke to the nurse earlier. Has questions. Every step she takes, she's hearing crunching. Swelling. Nurse Assessment Nurse: Charna Elizabethrumbull, RN, [name] Date/Time (Eastern Time): 08/15/2015 9:54:39 AM Confirm and document reason for call. If symptomatic, describe symptoms. ---Caller states wants to scheduled the first available appointment with Dr. Fabian SharpPanosh as a follow up to the urgent care visit. Scheduled for 08/19/15 at 1:30pm with Dr. Fabian SharpPanosh per request. No further questions or needs at this time. Encouraged to call back as needed. Has the patient traveled out of the country within the last 30 days? ---Not Applicable Does the patient have any new or worsening symptoms? ---No Guidelines Guideline Title Affirmed Question Affirmed Notes Final Disposition User Clinical Call Saffordrumbull, RN, EMCORCathy

## 2015-08-15 NOTE — Telephone Encounter (Signed)
Noted.  See second note from 12.30.2016.

## 2015-08-15 NOTE — Telephone Encounter (Signed)
[facility] - Client TELEPHONE ADVICE RECORD [facility] Patient Name: Catherine RobsonKRISTEN [name] DOB: Nov 14, 1990 Initial Comment Caller states dtr's feemer broke off 13 years ago, her knee gave out on her at work, you can hear crunching when she walks, very swollen Nurse Assessment Nurse: Lane HackerHarley, RN, Elvin SoWindy Date/Time (Eastern Time): 08/15/2015 8:44:13 AM Confirm and document reason for call. If symptomatic, describe symptoms. ---Caller states dtr has early stages of osteopenia. Right femur broke off 13 years ago and has required surgery twice in that leg. Her right knee gave out on her at work. She can hear crunching when she walks, and knee is very swollen. Has the patient traveled out of the country within the last 30 days? ---Not Applicable Does the patient have any new or worsening symptoms? ---Yes Will a triage be completed? ---No Select reason for no triage. ---Other Please document clinical information provided and list any resource used. ---Caller is not with her dtr. RN tried to reach dtr by phone and no answer. RN advised caller to have her dtr give us a call so we can triage s/s. Caller verb. understanding. Guidelines Guideline Title Affirmed Question Affirmed Notes Final Disposition User Clinical Call CliffordHarley, RN, MedtronicWindy

## 2015-08-19 ENCOUNTER — Ambulatory Visit: Payer: Self-pay | Admitting: Internal Medicine

## 2015-08-19 ENCOUNTER — Encounter: Payer: Self-pay | Admitting: "Endocrinology

## 2015-08-19 ENCOUNTER — Ambulatory Visit (INDEPENDENT_AMBULATORY_CARE_PROVIDER_SITE_OTHER): Payer: BLUE CROSS/BLUE SHIELD | Admitting: "Endocrinology

## 2015-08-19 VITALS — BP 103/79 | HR 86 | Wt 162.4 lb

## 2015-08-19 DIAGNOSIS — E063 Autoimmune thyroiditis: Secondary | ICD-10-CM | POA: Diagnosis not present

## 2015-08-19 DIAGNOSIS — E049 Nontoxic goiter, unspecified: Secondary | ICD-10-CM

## 2015-08-19 DIAGNOSIS — Z23 Encounter for immunization: Secondary | ICD-10-CM

## 2015-08-19 DIAGNOSIS — F32A Depression, unspecified: Secondary | ICD-10-CM

## 2015-08-19 DIAGNOSIS — F419 Anxiety disorder, unspecified: Secondary | ICD-10-CM

## 2015-08-19 DIAGNOSIS — R1013 Epigastric pain: Secondary | ICD-10-CM

## 2015-08-19 DIAGNOSIS — F418 Other specified anxiety disorders: Secondary | ICD-10-CM

## 2015-08-19 DIAGNOSIS — F329 Major depressive disorder, single episode, unspecified: Secondary | ICD-10-CM

## 2015-08-19 DIAGNOSIS — E559 Vitamin D deficiency, unspecified: Secondary | ICD-10-CM

## 2015-08-19 NOTE — Patient Instructions (Signed)
Follow up visit in 4 months.  

## 2015-08-19 NOTE — Progress Notes (Signed)
Subjective:  Patient Name: Catherine [name] Date of Birth: 1991-07-28  MRN: [id number]  Catherine [name]  presents to the office today for follow-up of her oligomenorrhea, PCOS, goiter, Hashimoto's thyroiditis, obesity, nausea and vomiting, dyspepsia, transient hypothyroidism, fatigue, anxiety and depression.[name]   HISTORY OF PRESENT ILLNESS:   Catherine [name] is a 25 y.o. Caucasian young woman.  [name] was accompanied by her mother.  1. The patient was first referred to me on 09/09/06 by her gynecologist, Dr. Dian Queen, for evaluation and management of secondary amenorrhea. She was then 25 years old.  A. Patient had been essentially healthy up through age 79-13, when she underwent menarche. Her menstrual cycles had never been regular. Her last normal menstrual period occurred in about 2005. She was not on any oral contraceptives  or contraceptive implants at that time. Another obstetrician put her on oral contraceptives at that point. She gained about 20 pounds in weight in that first year on oral contraceptives. She started evaluation with Dr. Helane Rima in 2007 or so. Dr. Helane Rima put her on Yaz initially, but later converted her to [facility].The patient's past medical history was positive for a lot of depression, mood swings, and panic attacks. She also had 2 knee surgeries in the seventh grade. She did not do any type of routine physical activity. Family history was positive for diabetes in the paternal grandfather and hypothyroidism in the paternal grandmother.  Father had severe depression and anxiety. Maternal grandmother had colon cancer. Mother was obese. [Addendum 08/19/15: Mother had PCOS as a young woman weighting 1 pounds.] Mother also had endometriosis. As I later learned on 07/10/12, one of [name]'s mother's first cousins had recently developed Addison's Disease.  B. On physical examination, her height was at the 35th percentile and her weight at 177.6 pounds was at the 93rd percentile. Her BMI was 30.7.  She had a 25+ gram thyroid gland. She was tender in the right lobe. She also had 1+ acanthosis nigricans. Previous laboratory data from 06/21/06 showed a normal CMP. Her TSH was 2.05 and her free T4 was 1.18. A serum glucose was 91 with a corresponding insulin level of 95, which was very excessive. Laboratory data at that visit included a blood glucose of 79 and hemoglobin A1c of 5.0%. Subsequent lab tests on 09/12/06 showed a fasting glucose of 72 and a fasting insulin of 22, again excessive. It appeared at that time that the patient was mildly obese. Her level of obesity was high enough, however, to cause a significant degree of insulin resistance and hyperinsulinemia. Her level of obesity may also have been high enough to cause oligomenorrhea. She had a goiter and clinical evidence of Hashimoto's disease, but she was euthyroid. I talked with the patient and her mother about our Eat Right Diet plan. I also talked about exercising 45-60 minutes a day. I started her on metformin, 500 mg, twice daily.  2. During the past 8  years, Lyndsay's weight has fluctuated up and down, varying between 150.7-179.4 lbs. She has continued to have flare ups of Hashimoto's disease. On 02/12/08 her TSH rose to 4.493, but then subsequently normalized. She has been euthyroid for most of the past 5 years. At times she's been on birth control patches, but most times has been on birth control pills. Her dyspepsia is better when she takes her Nexium and worse when she does not. Her anxiety and depression have been major problems for her. She has also had chronic fatigue, recurrent UTIs, and a myriad of gastrointestinal symptoms.  3. The patient's last [facility] visit was on 04/16/15.  A. In the interim she has been sick with multiple different illnesses, mostly URIs. . A lot of her coworkers have been sick.   B. She has been having more problems with her right knee. She has had two prior surgeries on that knee. The knee gave way once in the  past month.   C. She feels that she is doing much better emotionally. She sees her therapist once every 1-2 weeks. She feels more positive. She is living on her own again. Work is going well. Her relationship is going well. She feels "really, really good".  D. Her abdominal pains have been much reduced. She continues to take ranitidine twice daily. She is supposed to be taking metformin, 500 mg, twice daily, but usually misses the second dose.  E. She has not had any more UTIs, gout, or kidney stones since the Topamax was discontinued.  She still has an occasional menstrual migraine.   D. She takes her multivitamin and probiotic every day and a calcium/vitamin D supplement almost every day.  4. Pertinent Review of Systems:  Constitutional: Chalonda feels "generally quite well". Her energy level is pretty low when she is depressed, but is better now.   Eyes: Vision is good. There are no significant eye complaints. Neck: The patient has no complaints of anterior neck swelling, soreness, tenderness,  pressure, discomfort, or difficulty swallowing.  Heart: She had some faster heart rate when she is anxious. Heart rate increases with exercise or other physical activity. The patient has no other complaints of palpitations, irregular heat beats, chest pain, or chest pressure. Gastrointestinal: She has not had any nausea or diarrhea. Stomach pains still occur occasionally. She no longer feels hungry (belly hunger). She has no complaints of excessive hunger, acid reflux, or constipation.  Legs: Right knee pains as above. She no longer has leg cramps. Muscle mass and strength seem normal. There are no complaints of numbness, tingling, burning, or pain. No edema is noted. Feet: There are no obvious foot problems. There are no complaints of numbness, tingling, burning, or pain. No edema is noted. GYN: LMP was about 1 week ago. She has occasionally had some left or right pelvic pains associated with her menses. She  has had to change OCPs several times to find one that does not cause too many adverse effects.    PAST MEDICAL, FAMILY, AND SOCIAL HISTORY:  Past Medical History  Diagnosis Date  . Anxiety     Counseling and psychiatry  . Chronic headaches     Sees specialist  . Thyroid disease   . History of lower GI bleeding   . PUD (peptic ulcer disease)   . IBS (irritable bowel syndrome)   . PCOS (polycystic ovarian syndrome)   . Oligomenorrhea   . Goiter   . Hashimoto's thyroiditis   . Obesity   . Dyspepsia   . Hypothyroidism, acquired, autoimmune   . Fatigue   . History of shingles     face right   . History of recurrent UTIs     Sees urologist  . Hx of varicella     Family History  Problem Relation Age of Onset  . Breast cancer Maternal Grandmother   . Colon cancer Maternal Grandmother   . Colon polyps Maternal Grandmother   . Cancer Maternal Grandmother   . Diabetes Paternal Grandfather   . Lupus Paternal Grandmother   . Thyroid disease Paternal Grandmother  Hypothyroid without surgery or irradiation.  . Osteopenia Mother   . Obesity Mother      Current outpatient prescriptions:  .  ALPRAZolam (XANAX XR) 0.5 MG 24 hr tablet, Take by mouth every morning., Disp: , Rfl:  .  Calcium Carbonate-Vitamin D (CALCIUM 500 + D PO), Take by mouth., Disp: , Rfl:  .  cetirizine (ZYRTEC) 10 MG tablet, Take 10 mg by mouth daily.  , Disp: , Rfl:  .  desvenlafaxine (PRISTIQ) 50 MG 24 hr tablet, Take 100 mg by mouth daily. , Disp: , Rfl:  .  ferrous sulfate 325 (65 FE) MG EC tablet, Take 325 mg by mouth 3 (three) times daily with meals., Disp: , Rfl:  .  Multiple Vitamin (MULTI-VITAMIN PO), Take by mouth., Disp: , Rfl:  .  Norethin-Eth Estradiol-Fe [facility] FE PO), Take by mouth., Disp: , Rfl:  .  Probiotic Product (PROBIOTIC PO), Take by mouth., Disp: , Rfl:  .  ranitidine (ZANTAC) 150 MG tablet, TAKE 1 TABLET BY MOUTH 2 TIMES DAILY., Disp: 60 tablet, Rfl: 6 .  EPINEPHrine (EPIPEN) 0.3  mg/0.3 mL DEVI, Inject 0.3 mLs (0.3 mg total) into the muscle as needed. (Patient not taking: Reported on 08/19/2015), Disp: 2 Device, Rfl: 0 .  loperamide (IMODIUM A-D) 2 MG tablet, Take as directed (Patient not taking: Reported on 08/19/2015), Disp: 1 tablet, Rfl: 0 .  ondansetron (ZOFRAN-ODT) 4 MG disintegrating tablet, Take 1 tablet (4 mg total) by mouth every 8 (eight) hours as needed for nausea or vomiting. (Patient not taking: Reported on 04/16/2015), Disp: 20 tablet, Rfl: 0 .  triamcinolone cream (KENALOG) 0.1 %, Apply 1 application topically 2 (two) times daily. (Patient not taking: Reported on 12/02/2014), Disp: 30 g, Rfl: 0 .  valACYclovir (VALTREX) 1000 MG tablet, TAKE 2 TABLETS BY MOUTH TWICE A DAY OR AS DIRECTED (Patient not taking: Reported on 08/19/2015), Disp: 30 tablet, Rfl: 2  Allergies as of 08/19/2015 - Review Complete 08/19/2015  Allergen Reaction Noted  . Milk-related compounds Nausea And Vomiting 01/20/2013  . Nsaids  03/29/2011  . Penicillins  07/24/2011    1. 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  2. Activities: She does a lot of physical activity at work. 3. Smoking, alcohol, or drugs: None 4. Primary Care Provider: Dr. Shanon Ace, MD 5. Psychiatrist: Dr. Chucky May, MD - phone [phone], fax [phone] 6. GYN: Dr. Dian Queen, MD  REVIEW OF SYSTEMS: There are no other significant problems involving Topacio's other body systems.   Objective:  Vital Signs:  BP 103/79 mmHg  Pulse 86  Wt 162 lb 6.4 oz (73.664 kg)    Ht Readings from Last 3 Encounters:  05/31/14 5' 4.5" (1.638 m)  05/13/14 '5\' 6"'  (1.676 m)  10/15/13 5' 4.75" (1.645 m)   Wt Readings from Last 3 Encounters:  08/19/15 162 lb 6.4 oz (73.664 kg)  04/16/15 165 lb (74.844 kg)  12/02/14 169 lb (76.658 kg)   There is no height on file to calculate BSA.  Normalized stature-for-age data available only for age 18 to 15 years. Normalized  weight-for-age data available only for age 18 to 20 years.  PHYSICAL EXAM:  Constitutional: Lesleyann is bright and upbeat today. She is positive and laughs a lot.. She has lost another 3 pounds in the past 6 months. She looks a bit slimmer. Face: The face appears normal.  Eyes: There is no obvious arcus or proptosis. Moisture  appears normal. Mouth: The oropharynx and tongue appear normal. Dentition appears to be normal for age. Oral moisture is normal. She has no hyperpigmentation of the oral mucosa. Neck: The neck appears to be visibly normal. No carotid bruits are noted. The thyroid gland has shrunk back to normal size at 20 grams. The consistency of the thyroid gland is normal. The thyroid gland is not tender to palpation today. She has 1+ acanthosis nigricans.  Lungs: The lungs are clear to auscultation. Air movement is good. Heart: Heart rate and rhythm are regular. Heart sounds S1 and S2 are normal. I did not appreciate any pathologic cardiac murmurs. Abdomen: The abdomen is enlarged, but smaller. Bowel sounds are normal. There is no obvious hepatomegaly, splenomegaly, or other mass effect. She has no tenderness to palpation.   Arms: Muscle size and bulk are normal for age. Hands: There is no obvious tremor. Phalangeal and metacarpophalangeal joints are normal. Palmar muscles are normal. Palmar skin is normal. Palmar moisture is also normal. Nails are normally pink. There is no palmar hyperpigmentation.  Legs: Muscles appear normal for age. No edema is present. Neurologic: Strength is normal for age in both the upper and lower extremities. Muscle tone is normal. Sensation to touch is normal in both legs.     LAB DATA:   Labs 08/14/15: TSH 0.505, free T4 1.24, free T3 2.7, TPO antibody 1, thyroglobulin antibody <1; 25-OH vitamin D 44, PTH 32, calcium 9.4  Labs 04/11/15: TSH 0.910, free T4 0.89, free T3 2.9; testosterone 35; 25-OH vitamin D 38  Labs 11/27/14: HbA1c 5.2%; calcium 9.2, PTH 50,  25-OH vitamin D 34, 1,25-dihydroxy vitamin D 116;  TSH 1.094, free T4 1.15, free T3 2.9;   Labs 05/23/14: TSH 0.570, free T4 1.10, free T3 3.3; HbA1c 4.8%; calcium 9.8, PTH 36, 25-hydroxy vitamin D 56  Labs 07/09/13: 25-hydroxy Vitamin D 31; calcium 9.2, PTH 64.3;   Labs 08/30/13; TSH 0.778, free T4 1.17, free T3  3.4, TSI 43  Labs 10/23/12: TSH 0.812, free T4 1.02, free T3 2.9; iron 114, 25-hydroxy vitamin D 20, calcium 9.5, PTH 40.6  Labs 07/04/12 at 9 AM: ACTH 24, cortisol 17.3  labs 02/18/12: TSH 1.208, free T4, free T3 3.0  Bone mineral density 09/22/12: Osteopenia of the spine based upon T-score for age   Assessment and Plan:   ASSESSMENT:  1. Thyroiditis:   A. The patient's Hashimoto's disease is clinically quiescent again today.  B. The fact that both her TSH and free T4 decreased in parallel from April to August 2016 indicated that she had had a recent flare up of Hashimoto's thyroiditis. The shift of all 3 TFTs downward together from January to October 2015 was also pathognomonic for a flare up of Hashimoto's disease at that time.  C. Her recent TFTs were normal. Her free T4 has increased and her TSH has decreased appropriately. However, her TSH is still relatively low, indicating that her overall thyroid function status has still not reached a normal steady state.  2. Goiter: The thyroid gland has shrunk back down to normal size. The process of waxing and waning of thyroid gland size shows that she has more thyroiditis at some times and less at other times.  3. Obesity: She has continued to lose weight. She is now at the border line between obesity and overweight. She sometimes eats healthier, sometimes not, but really needs to eat healthier all the time. She would also benefit from more exercise. 4. Dyspepsia: This problem has  improved with the combination of ranitidine and metformin.  5. Fatigue: She is doing much better.  6. Anxiety and depression: She is doing much better. She  has more joy in her life.  7. Vitamin D deficiency/excess: Her 25-OH vitamin D level is good.  I'd like her to have a somewhat higher calcium level to facilitate better calcium uptake by her bones.   PLAN:  1. Diagnostic: No lab tests prior to next visit.  2. Therapeutic: Take one good MVI per day, either Centrum for Women or One-A-Day for Women. Continue the calcium, magnesium, and vitamin D supplements. Get an hour or more of exercise per day. Take ranitidine, 150 mg, twice daily and metformin 500 mg twice daily or 1000 mg XR once daily.  3. Patient education: We discussed vitamin D deficiency, secondary hyperparathyroidism, thyroiditis, dyspepsia,  and her anxiety and depression.   4. Follow-up: 4 months  Level of Service: This visit lasted in excess of 65 minutes. More than 50% of the visit was devoted to counseling.  Sherrlyn Hock, MD 08/19/2015 11:03 AM

## 2015-08-20 ENCOUNTER — Encounter: Payer: Self-pay | Admitting: Internal Medicine

## 2015-08-20 ENCOUNTER — Ambulatory Visit (INDEPENDENT_AMBULATORY_CARE_PROVIDER_SITE_OTHER): Payer: BLUE CROSS/BLUE SHIELD | Admitting: Internal Medicine

## 2015-08-20 VITALS — BP 112/70 | Temp 99.1°F | Ht 64.0 in | Wt 163.9 lb

## 2015-08-20 DIAGNOSIS — M25561 Pain in right knee: Secondary | ICD-10-CM

## 2015-08-20 DIAGNOSIS — Z9889 Other specified postprocedural states: Secondary | ICD-10-CM | POA: Diagnosis not present

## 2015-08-20 MED ORDER — DICLOFENAC SODIUM 1 % TD GEL: 4.0000 g | Freq: Four times a day (QID) | TRANSDERMAL | Status: DC | PRN

## 2015-08-20 MED ORDER — EPINEPHRINE 0.3 MG/0.3ML IJ SOAJ: 0.3000 mg | INTRAMUSCULAR | Status: DC | PRN

## 2015-08-20 NOTE — Patient Instructions (Addendum)
Need ortho referral   To evaluate knee.     Can try topical antiinflammatory    Relative rest .  Other .  In the interim .  Send in epipen for now.

## 2015-08-20 NOTE — Progress Notes (Signed)
Pre visit review using our clinic review tool, if applicable. No additional management support is needed unless otherwise documented below in the visit note.  Chief Complaint  Patient presents with  . Knee Pain    Rt knee pain X 2wks.  Hears popping and cracking.    HPI: Patient Catherine [name]  comes in today for SDA for  new problem evaluation. Onset  When walking at work   And had pop inside area  . And pain and swelling ever since  Pops  Tired ice  Hurst to walk  Feels a lot like when she had to have her knee surgery see below . Feels locks and unstable   Has had surgery x 2  Loose body lap . From femur area .      Baseline ocass ache  About age 25 .   Jogging  At gym   Taking off shoe.   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      ROS: See pertinent positives and negatives per HPI. Please refill epi pen.  Past Medical History  Diagnosis Date  . Anxiety     Counseling and psychiatry  . Chronic headaches     Sees specialist  . Thyroid disease   . History of lower GI bleeding   . PUD (peptic ulcer disease)   . IBS (irritable bowel syndrome)   . PCOS (polycystic ovarian syndrome)   . Oligomenorrhea   . Goiter   . Hashimoto's thyroiditis   . Obesity   . Dyspepsia   . Hypothyroidism, acquired, autoimmune   . Fatigue   . History of shingles     face right   . History of recurrent UTIs     Sees urologist  . Hx of varicella     Family History  Problem Relation Age of Onset  . Breast cancer Maternal Grandmother   . Colon cancer Maternal Grandmother   . Colon polyps Maternal Grandmother   . Cancer Maternal Grandmother   . Diabetes Paternal Grandfather   . Lupus Paternal Grandmother   . Thyroid disease Paternal Grandmother     Hypothyroid without surgery or irradiation.  . Osteopenia Mother   . Obesity Mother     Social History   Social History  . Marital Status: Single    Spouse Name: N/A  . Number of Children: 0  . Years of Education: N/A   Occupational History  .  student    Social History Main Topics  . Smoking status: Never Smoker   . Smokeless tobacco: Never Used  . Alcohol Use: No  . Drug Use: No  . Sexual Activity: Yes    Birth Control/ Protection: Pill   Other Topics Concern  . None   Social History Narrative   Lives by herself with 5 cats near [facility] [address] apartment. 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 Will be difficult to go back financially.    Outpatient Prescriptions Prior to Visit  Medication Sig Dispense Refill  . ALPRAZolam (XANAX XR) 0.5 MG 24 hr tablet Take by mouth every morning.    . Calcium Carbonate-Vitamin D (CALCIUM 500 + D PO) Take by mouth.    . cetirizine (ZYRTEC) 10 MG tablet Take 10 mg by mouth daily.      [name] desvenlafaxine (PRISTIQ) 50 MG 24  hr tablet Take 100 mg by mouth daily.     . ferrous sulfate 325 (65 FE) MG EC tablet Take 325 mg by mouth 3 (three) times daily with meals.    [name]. loperamide (IMODIUM A-D) 2 MG tablet Take as directed 1 tablet 0  . Multiple Vitamin (MULTI-VITAMIN PO) Take by mouth.    Kathrynn Running. Norethin-Eth Estradiol-Fe [facility](WYMZYA FE PO) Take by mouth.    . Probiotic Product (PROBIOTIC PO) Take by mouth.    . ranitidine (ZANTAC) 150 MG tablet TAKE 1 TABLET BY MOUTH 2 TIMES DAILY. 60 tablet 6  . EPINEPHrine (EPIPEN) 0.3 mg/0.3 mL DEVI Inject 0.3 mLs (0.3 mg total) into the muscle as needed. 2 Device 0  . ondansetron (ZOFRAN-ODT) 4 MG disintegrating tablet Take 1 tablet (4 mg total) by mouth every 8 (eight) hours as needed for nausea or vomiting. (Patient not taking: Reported on 04/16/2015) 20 tablet 0  . triamcinolone cream (KENALOG) 0.1 % Apply 1 application topically 2 (two) times daily. (Patient not taking: Reported on 12/02/2014) 30 g 0  . valACYclovir (VALTREX) 1000 MG tablet TAKE 2 TABLETS BY MOUTH TWICE A DAY OR AS DIRECTED (Patient not  taking: Reported on 08/19/2015) 30 tablet 2   No facility-administered medications prior to visit.     EXAM:  BP 112/70 mmHg  Temp(Src) 99.1 F (37.3 C) (Oral)  Ht 5\' 4"  (1.626 m)  Wt 163 lb 14.4 oz (74.345 kg)  BMI 28.12 kg/m2  Body mass index is 28.12 kg/(m^2).  GENERAL: vitals reviewed and listed above, alert, oriented, appears well hydrated and in no acute distress  Right knee  1 + weffusion no redness warmth  Loud click on flexion   Some pain   Peripatellar swelling mild .  ? Stability    Walks with a linp nv intact   PSYCH: pleasant and cooperative, no obvious depression or anxiety  ASSESSMENT AND PLAN:  Discussed the following assessment and plan:  Knee pain, acute, right - poss  internal derangements .. - Plan: Ambulatory referral to Orthopedic Surgery  Hx of arthroscopic knee surgery Cant take  Oral nsaids using support  Concern about internal derangement   Ortho consult  Soon  -Patient advised to return or notify health care team  if symptoms worsen ,persist or new concerns arise.  Patient Instructions  Need ortho referral   To evaluate knee.     Can try topical antiinflammatory    Relative rest .  Other .  In the interim .  Send in epipen for now.      Neta MendsWanda K. Panosh M.D.

## 2015-08-22 ENCOUNTER — Telehealth: Payer: Self-pay | Admitting: Internal Medicine

## 2015-08-28 NOTE — Telephone Encounter (Signed)
error 

## 2015-08-29 ENCOUNTER — Other Ambulatory Visit: Payer: Self-pay | Admitting: Sports Medicine

## 2015-08-29 DIAGNOSIS — M2391 Unspecified internal derangement of right knee: Secondary | ICD-10-CM

## 2015-08-29 DIAGNOSIS — M25561 Pain in right knee: Secondary | ICD-10-CM

## 2015-09-04 ENCOUNTER — Ambulatory Visit
Admission: RE | Admit: 2015-09-04 | Discharge: 2015-09-04 | Disposition: A | Discharge status: Home / Self Care | Payer: BLUE CROSS/BLUE SHIELD | Source: Ambulatory Visit | Attending: Sports Medicine | Admitting: Sports Medicine

## 2015-09-04 DIAGNOSIS — M2391 Unspecified internal derangement of right knee: Secondary | ICD-10-CM

## 2015-12-09 DIAGNOSIS — F410 Panic disorder [episodic paroxysmal anxiety] without agoraphobia: Secondary | ICD-10-CM | POA: Diagnosis not present

## 2015-12-09 DIAGNOSIS — F411 Generalized anxiety disorder: Secondary | ICD-10-CM | POA: Diagnosis not present

## 2015-12-09 DIAGNOSIS — F331 Major depressive disorder, recurrent, moderate: Secondary | ICD-10-CM | POA: Diagnosis not present

## 2015-12-16 ENCOUNTER — Ambulatory Visit: Payer: BLUE CROSS/BLUE SHIELD | Admitting: "Endocrinology

## 2015-12-23 ENCOUNTER — Ambulatory Visit (INDEPENDENT_AMBULATORY_CARE_PROVIDER_SITE_OTHER): Payer: BLUE CROSS/BLUE SHIELD | Admitting: "Endocrinology

## 2015-12-23 ENCOUNTER — Encounter: Payer: Self-pay | Admitting: "Endocrinology

## 2015-12-23 VITALS — BP 105/76 | HR 96 | Wt 164.6 lb

## 2015-12-23 DIAGNOSIS — F419 Anxiety disorder, unspecified: Secondary | ICD-10-CM

## 2015-12-23 DIAGNOSIS — E049 Nontoxic goiter, unspecified: Secondary | ICD-10-CM

## 2015-12-23 DIAGNOSIS — F418 Other specified anxiety disorders: Secondary | ICD-10-CM

## 2015-12-23 DIAGNOSIS — R1013 Epigastric pain: Secondary | ICD-10-CM

## 2015-12-23 DIAGNOSIS — E063 Autoimmune thyroiditis: Secondary | ICD-10-CM

## 2015-12-23 DIAGNOSIS — E663 Overweight: Secondary | ICD-10-CM

## 2015-12-23 DIAGNOSIS — F32A Depression, unspecified: Secondary | ICD-10-CM

## 2015-12-23 DIAGNOSIS — E559 Vitamin D deficiency, unspecified: Secondary | ICD-10-CM

## 2015-12-23 DIAGNOSIS — F329 Major depressive disorder, single episode, unspecified: Secondary | ICD-10-CM

## 2015-12-23 DIAGNOSIS — R5383 Other fatigue: Secondary | ICD-10-CM

## 2015-12-23 LAB — POCT GLYCOSYLATED HEMOGLOBIN (HGB A1C): Hemoglobin A1C: 4.7

## 2015-12-23 LAB — GLUCOSE, POCT (MANUAL RESULT ENTRY): POC GLUCOSE: 94 mg/dL (ref 70–99)

## 2015-12-23 NOTE — Progress Notes (Signed)
Subjective:  Patient Name: Catherine [name] Date of Birth: 1991/04/07  MRN: [id number]  Rosalyn Archambault  presents to the office today for follow-up of her oligomenorrhea, PCOS, goiter, Hashimoto's thyroiditis, obesity, nausea and vomiting, dyspepsia, transient hypothyroidism, fatigue, anxiety and depression.   HISTORY OF PRESENT ILLNESS:   Catherine [name] is a 25 y.o. Caucasian young woman.  Catherine [name] was accompanied by her mother.  1. The patient was first referred to me on 09/09/06 by her gynecologist, Dr. Dian Queen, for evaluation and management of secondary amenorrhea. She was then 25 years old.  A. Patient had been essentially healthy up through age 60-13, when she underwent menarche. Her menstrual cycles had never been regular. Her last normal menstrual period occurred in about 2005. She was not on any oral contraceptives  or contraceptive implants at that time. Another obstetrician put her on oral contraceptives at that point. She gained about 20 pounds in weight in that first year on oral contraceptives. She started evaluation with Dr. Helane Rima in 2007 or so. Dr. Helane Rima put her on Yaz initially, but later converted her to [facility].The patient's past medical history was positive for a lot of depression, mood swings, and panic attacks. She also had 2 knee surgeries in the seventh grade. She did not do any type of routine physical activity. Family history was positive for diabetes in the paternal grandfather and hypothyroidism in the paternal grandmother.  Father had severe depression and anxiety. Maternal grandmother had colon cancer. Mother was obese. [Addendum 08/19/15: Mother had PCOS as a young woman weighting 105 pounds.] Mother also had endometriosis. As I later learned on 07/10/12, one of Trinadee's mother's first cousins had recently developed Addison's Disease.  B. On physical examination, her height was at the 35th percentile and her weight at 177.6 pounds was at the 93rd percentile. Her BMI was 30.7.  She had a 25+ gram thyroid gland. She was tender in the right lobe. She also had 1+ acanthosis nigricans. Previous laboratory data from 06/21/06 showed a normal CMP. Her TSH was 2.05 and her free T4 was 1.18. A serum glucose was 91 with a corresponding insulin level of 95, which was very excessive. Laboratory data at that visit included a blood glucose of 79 and hemoglobin A1c of 5.0%. Subsequent lab tests on 09/12/06 showed a fasting glucose of 72 and a fasting insulin of 22, again excessive. It appeared at that time that the patient was mildly obese. Her level of obesity was high enough, however, to cause a significant degree of insulin resistance and hyperinsulinemia. Her level of obesity may also have been high enough to cause oligomenorrhea. She had a goiter and clinical evidence of Hashimoto's disease, but she was euthyroid. I talked with the patient and her mother about our Eat Right Diet plan. I also talked about exercising 45-60 minutes a day. I started her on metformin, 500 mg, twice daily.  2. During the past 9 years, Catherine [name]'s weight has fluctuated up and down, varying between 150.7-179.4 lbs. She has continued to have flare ups of Hashimoto's disease. On 02/12/08 her TSH rose to 4.493, but then subsequently normalized. She has been euthyroid for most of the past 8 years. At times she's been on birth control patches, but most times has been on birth control pills. Her dyspepsia is better when she takes her ranitidine and worse when she does not. Her anxiety and depression have been major problems for her. She has also had chronic fatigue, recurrent UTIs, and a myriad of gastrointestinal symptoms.  3. The patient's last [facility] visit was on 08/19/15.  A. In the interim she has had some migraines, but her major problem is worsening depression and associated exhaustion. She saw her psychiatrist who changed her medications. She is weaning off Pristiq and ramping up on Viibrid, a newer SSRI.  During the  transition she has had more nausea, occasional vomiting, headaches, lethargy, and brain fog. Her depression is beginning to improve. However, this is the first time in five days that she has felt well enough physically and emotionally to leave her house.  B. She has been having more problems with her right knee after being injured at work, so she was fired. She has had two prior surgeries on that knee. She saw her orthopedist who gave her steroid injections. The knee is better. 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    C. Her abdominal pains have recurred during the medication transition.    D. She has not had any more UTIs, gout, or kidney stones since the Topamax was discontinued.  She still has an occasional menstrual migraine, perhaps due to changing OCPs.   E. She continues to take ranitidine twice daily. She is supposed to be taking metformin, 500 mg, twice daily, but usually misses the second dose. She takes her multivitamin and probiotic every day and a calcium/vitamin D supplement almost every day.  4. Pertinent Review of Systems:  Constitutional: Catherine [name] feels "truly horrible" due to the severe depression and fatigue. She has been  better in the past week.  Eyes: Vision is good. There are no significant eye complaints. Neck: The patient has occasional anterior neck swelling and tenderness, but no difficulty swallowing.  Heart: She had some faster heart rate when she is anxious. Heart rate increases with exercise or other physical activity. The patient has no other complaints of palpitations, irregular heat beats, chest pain, or chest pressure. Gastrointestinal: As above. She no longer feels hungry (belly hunger). She has no complaints of excessive hunger, acid reflux, or constipation.  Legs: Right knee pains as above. She no longer has leg cramps. Muscle mass and strength seem normal. There are no complaints of numbness, tingling, burning, or pain. No edema is noted. Feet: There  are no obvious foot problems. There are no complaints of numbness, tingling, burning, or pain. No edema is noted. GYN: LMP is now. She has had to change OCPs several times to find one that does not cause too many adverse effects.    PAST MEDICAL, FAMILY, AND SOCIAL HISTORY:  Past Medical History  Diagnosis Date  . Anxiety     Counseling and psychiatry  . Chronic headaches     Sees specialist  . Thyroid disease   . History of lower GI bleeding   . PUD (peptic ulcer disease)   . IBS (irritable bowel syndrome)   . PCOS (polycystic ovarian syndrome)   . Oligomenorrhea   . Goiter   . Hashimoto's thyroiditis   . Obesity   . Dyspepsia   . Hypothyroidism, acquired, autoimmune   . Fatigue   . History of shingles     face right   . History of recurrent UTIs     Sees urologist  . Hx of varicella     Family History  Problem Relation Age of Onset  . Breast cancer Maternal Grandmother   . Colon cancer Maternal Grandmother   . Colon polyps Maternal Grandmother   . Cancer Maternal Grandmother   . Diabetes Paternal Grandfather   .  Lupus Paternal Grandmother   . Thyroid disease Paternal Grandmother     Hypothyroid without surgery or irradiation.  . Osteopenia Mother   . Obesity Mother      Current outpatient prescriptions:  .  ALPRAZolam (XANAX XR) 0.5 MG 24 hr tablet, Take by mouth every morning., Disp: , Rfl:  .  Calcium Carbonate-Vitamin D (CALCIUM 500 + D PO), Take by mouth., Disp: , Rfl:  .  cetirizine (ZYRTEC) 10 MG tablet, Take 10 mg by mouth daily.  , Disp: , Rfl:  .  desvenlafaxine (PRISTIQ) 50 MG 24 hr tablet, Take 100 mg by mouth daily. , Disp: , Rfl:  .  diclofenac sodium (VOLTAREN) 1 % GEL, Apply 4 g topically 4 (four) times daily as needed., Disp: 200 g, Rfl: 1 .  EPINEPHrine 0.3 mg/0.3 mL IJ SOAJ injection, Inject 0.3 mLs (0.3 mg total) into the muscle as needed., Disp: 2 Device, Rfl: 0 .  ferrous sulfate 325 (65 FE) MG EC tablet, Take 325 mg by mouth 3 (three) times  daily with meals., Disp: , Rfl:  .  Multiple Vitamin (MULTI-VITAMIN PO), Take by mouth., Disp: , Rfl:  .  Norethin-Eth Estradiol-Fe [facility] FE PO), Take by mouth., Disp: , Rfl:  .  Probiotic Product (PROBIOTIC PO), Take by mouth., Disp: , Rfl:  .  ranitidine (ZANTAC) 150 MG tablet, TAKE 1 TABLET BY MOUTH 2 TIMES DAILY., Disp: 60 tablet, Rfl: 6 .  Vilazodone HCl (VIIBRYD) 20 MG TABS, Take by mouth., Disp: , Rfl:  .  loperamide (IMODIUM A-D) 2 MG tablet, Take as directed (Patient not taking: Reported on 12/23/2015), Disp: 1 tablet, Rfl: 0 .  ondansetron (ZOFRAN-ODT) 4 MG disintegrating tablet, Take 1 tablet (4 mg total) by mouth every 8 (eight) hours as needed for nausea or vomiting. (Patient not taking: Reported on 04/16/2015), Disp: 20 tablet, Rfl: 0 .  triamcinolone cream (KENALOG) 0.1 %, Apply 1 application topically 2 (two) times daily. (Patient not taking: Reported on 12/02/2014), Disp: 30 g, Rfl: 0 .  valACYclovir (VALTREX) 1000 MG tablet, TAKE 2 TABLETS BY MOUTH TWICE A DAY OR AS DIRECTED (Patient not taking: Reported on 08/19/2015), Disp: 30 tablet, Rfl: 2  Allergies as of 12/23/2015 - Review Complete 08/20/2015  Allergen Reaction Noted  . Milk-related compounds Nausea And Vomiting 01/20/2013  . Nsaids  03/29/2011  . Penicillins  07/24/2011    1. 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   2. Activities: She has not done much physical activity. 3. Smoking, alcohol, or drugs: None 4. Primary Care Provider: Dr. Shanon Ace, MD 5. Psychiatrist: Dr. Chucky May, MD - phone [phone], fax [phone] 6. GYN: Dr. Dian Queen, MD  REVIEW OF SYSTEMS: There are no other significant problems involving Catherine [name]'s other body systems.   Objective:  Vital Signs:  BP 105/76 mmHg  Pulse 96  Wt 164 lb 9.6 oz (74.662 kg)    Ht Readings from Last 3 Encounters:  08/20/15 '5\' 4"'  (1.626 m)  05/31/14 5' 4.5" (1.638 m)  05/13/14 '5\' 6"'  (1.676 m)   Wt Readings from Last 3  Encounters:  12/23/15 164 lb 9.6 oz (74.662 kg)  08/20/15 163 lb 14.4 oz (74.345 kg)  08/19/15 162 lb 6.4 oz (73.664 kg)   There is no height on file to calculate BSA.  Facility age limit for growth percentiles is 20 years. Facility age limit for growth percentiles is 20 years.  PHYSICAL EXAM:  Constitutional: Catherine [name] is bright and fairly upbeat today.  She is fairly positive and laughs frequently. Her affect and insight are normal. She has gained 11 ounces in the past 4 months.  Face: The face appears normal.  Eyes: There is no obvious arcus or proptosis. Moisture appears normal. Mouth: The oropharynx and tongue appear normal. Dentition appears to be normal for age. Oral moisture is normal. She has no hyperpigmentation of the oral mucosa. Neck: The neck appears to be visibly normal. No carotid bruits are noted. The thyroid gland has increased in size to about 21-22 grams. The consistency of the thyroid gland is relatively full. The thyroid gland is tender to palpation bilaterally today. She has 1+ acanthosis nigricans.  Lungs: The lungs are clear to auscultation. Air movement is good. Heart: Heart rate and rhythm are regular. Heart sounds S1 and S2 are normal. I did not appreciate any pathologic cardiac murmurs. Abdomen: The abdomen is enlarged, but smaller. Bowel sounds are normal. There is no obvious hepatomegaly, splenomegaly, or other mass effect. She has no tenderness to palpation.   Arms: Muscle size and bulk are normal for age. Hands: There is no obvious tremor. Phalangeal and metacarpophalangeal joints are normal. Palmar muscles are normal. Palmar skin is normal. Palmar moisture is also normal. Nails are normally pink. There is no palmar hyperpigmentation.  Legs: Muscles appear normal for age. No edema is present. Neurologic: Strength is normal for age in both the upper and lower extremities. Muscle tone is normal. Sensation to touch is normal in both legs.     LAB DATA:   Labs  12/23/15: HbA1c 4.7%  Labs 08/14/15: TSH 0.505, free T4 1.24, free T3 2.7, TPO antibody 1, thyroglobulin antibody <1; 25-OH vitamin D 44, PTH 32, calcium 9.4  Labs 04/11/15: TSH 0.910, free T4 0.89, free T3 2.9; testosterone 35; 25-OH vitamin D 38  Labs 11/27/14: HbA1c 5.2%; calcium 9.2, PTH 50, 25-OH vitamin D 34, 1,25-dihydroxy vitamin D 116;  TSH 1.094, free T4 1.15, free T3 2.9;   Labs 05/23/14: TSH 0.570, free T4 1.10, free T3 3.3; HbA1c 4.8%; calcium 9.8, PTH 36, 25-hydroxy vitamin D 56  Labs 07/09/13: 25-hydroxy Vitamin D 31; calcium 9.2, PTH 64.3;   Labs 08/30/13; TSH 0.778, free T4 1.17, free T3  3.4, TSI 43  Labs 10/23/12: TSH 0.812, free T4 1.02, free T3 2.9; iron 114, 25-hydroxy vitamin D 20, calcium 9.5, PTH 40.6  Labs 07/04/12 at 9 AM: ACTH 24, cortisol 17.3  labs 02/18/12: TSH 1.208, free T4, free T3 3.0  Bone mineral density 09/22/12: Osteopenia of the spine based upon T-score for age   Assessment and Plan:   ASSESSMENT:  1. Thyroiditis:   A. The patient's Hashimoto's disease is clinically active bilaterally today and has been active for at least several weeks, if not longer. This phenomenon indicates that she will eventually become hypothyroid.   B. The fact that both her TSH and free T4 decreased in parallel from April to August 2016 indicated that she had had a recent flare up of Hashimoto's thyroiditis. The shift of all 3 TFTs downward together from January to October 2015 was also pathognomonic for a flare up of Hashimoto's disease at that time.  C. Her TFTs in December 2016 were normal.  2. Goiter: The thyroid gland has increased in size, c/w this current flare up of thyroiditis. The process of waxing and waning of thyroid gland size shows that she has evolving Hashimoto's  Thyroiditis.  3. Obesity/overweight: She has regained a little weight. She is now at the  border line between obesity and overweight. She sometimes eats healthier, sometimes not, but really needs to  eat healthier all the time. She would also benefit from low-impact exercise that she can do, such as water aerobics or bike riding. 4. Dyspepsia: This problem has improved with the combination of ranitidine and metformin, but she needs to take the metformin regularly.   5. Fatigue: She is doing fairly well today, but her fatigue varies greatly with her depression.  6. Anxiety and depression: She is doing fairly well today, but has been much worse in the past month.  7. Vitamin D deficiency/excess: Her 25-OH vitamin D level in December was good. I'd like her to have a somewhat higher calcium level to facilitate better calcium uptake by her bones.   PLAN:  1. Diagnostic: TFTs prior to next visit.  2. Therapeutic: Take one good MVI per day, either Centrum for Women or One-A-Day for Women. Continue the calcium, magnesium, and vitamin D supplements. Get an hour or more of exercise per day. Take ranitidine, 150 mg, twice daily and metformin 1000 mg XR once daily.  3. Patient education: We discussed thyroiditis, goiter, vitamin D deficiency, secondary hyperparathyroidism, thyroiditis, dyspepsia,  and her anxiety and depression.   4. Follow-up: 4 months  Level of Service: This visit lasted in excess of 55 minutes. More than 50% of the visit was devoted to counseling.  Sherrlyn Hock, MD 12/23/2015 3:25 PM

## 2015-12-23 NOTE — Patient Instructions (Addendum)
Follow up visit in 4 months. Please repeat thyroid tests 1-2 weeks prior.

## 2016-01-12 ENCOUNTER — Encounter (HOSPITAL_COMMUNITY): Payer: Self-pay | Admitting: Emergency Medicine

## 2016-01-12 ENCOUNTER — Emergency Department (HOSPITAL_COMMUNITY)
Admission: EM | Admit: 2016-01-12 | Discharge: 2016-01-13 | Disposition: A | Discharge status: Home / Self Care | Payer: BLUE CROSS/BLUE SHIELD | Attending: Emergency Medicine | Admitting: Emergency Medicine

## 2016-01-12 DIAGNOSIS — Z8619 Personal history of other infectious and parasitic diseases: Secondary | ICD-10-CM | POA: Diagnosis not present

## 2016-01-12 DIAGNOSIS — G8929 Other chronic pain: Secondary | ICD-10-CM | POA: Insufficient documentation

## 2016-01-12 DIAGNOSIS — Z8742 Personal history of other diseases of the female genital tract: Secondary | ICD-10-CM | POA: Diagnosis not present

## 2016-01-12 DIAGNOSIS — E669 Obesity, unspecified: Secondary | ICD-10-CM | POA: Insufficient documentation

## 2016-01-12 DIAGNOSIS — Z8719 Personal history of other diseases of the digestive system: Secondary | ICD-10-CM | POA: Insufficient documentation

## 2016-01-12 DIAGNOSIS — Z87440 Personal history of urinary (tract) infections: Secondary | ICD-10-CM | POA: Diagnosis not present

## 2016-01-12 DIAGNOSIS — T43225A Adverse effect of selective serotonin reuptake inhibitors, initial encounter: Secondary | ICD-10-CM | POA: Insufficient documentation

## 2016-01-12 DIAGNOSIS — T50905A Adverse effect of unspecified drugs, medicaments and biological substances, initial encounter: Secondary | ICD-10-CM

## 2016-01-12 DIAGNOSIS — F419 Anxiety disorder, unspecified: Secondary | ICD-10-CM | POA: Insufficient documentation

## 2016-01-12 DIAGNOSIS — Z880 Allergy status to penicillin: Secondary | ICD-10-CM | POA: Diagnosis not present

## 2016-01-12 DIAGNOSIS — Z79899 Other long term (current) drug therapy: Secondary | ICD-10-CM | POA: Insufficient documentation

## 2016-01-12 DIAGNOSIS — Z8711 Personal history of peptic ulcer disease: Secondary | ICD-10-CM | POA: Insufficient documentation

## 2016-01-12 DIAGNOSIS — R410 Disorientation, unspecified: Secondary | ICD-10-CM | POA: Insufficient documentation

## 2016-01-12 DIAGNOSIS — T43295A Adverse effect of other antidepressants, initial encounter: Secondary | ICD-10-CM | POA: Diagnosis not present

## 2016-01-12 HISTORY — DX: Autoimmune thyroiditis: E06.3

## 2016-01-12 LAB — URINE MICROSCOPIC-ADD ON

## 2016-01-12 LAB — URINALYSIS, ROUTINE W REFLEX MICROSCOPIC
BILIRUBIN URINE: NEGATIVE
GLUCOSE, UA: NEGATIVE mg/dL
KETONES UR: NEGATIVE mg/dL
LEUKOCYTES UA: NEGATIVE
Nitrite: NEGATIVE
PH: 7.5 (ref 5.0–8.0)
PROTEIN: NEGATIVE mg/dL
Specific Gravity, Urine: 1.012 (ref 1.005–1.030)

## 2016-01-12 LAB — COMPREHENSIVE METABOLIC PANEL
ALBUMIN: 3.7 g/dL (ref 3.5–5.0)
ALK PHOS: 72 U/L (ref 38–126)
ALT: 15 U/L (ref 14–54)
ANION GAP: 7 (ref 5–15)
AST: 18 U/L (ref 15–41)
BILIRUBIN TOTAL: 0.5 mg/dL (ref 0.3–1.2)
BUN: 5 mg/dL — ABNORMAL LOW (ref 6–20)
CALCIUM: 9.5 mg/dL (ref 8.9–10.3)
CO2: 25 mmol/L (ref 22–32)
CREATININE: 0.8 mg/dL (ref 0.44–1.00)
Chloride: 103 mmol/L (ref 101–111)
GFR calc Af Amer: >60 mL/min (ref >60)
GFR calc non Af Amer: >60 mL/min (ref >60)
GLUCOSE: 92 mg/dL (ref 65–99)
Potassium: 4.2 mmol/L (ref 3.5–5.1)
Sodium: 135 mmol/L (ref 135–145)
TOTAL PROTEIN: 6.9 g/dL (ref 6.5–8.1)

## 2016-01-12 LAB — CBC WITH DIFFERENTIAL/PLATELET
Basophils Absolute: 0 10*3/uL (ref 0.0–0.1)
Basophils Relative: 0 %
Eosinophils Absolute: 0 10*3/uL (ref 0.0–0.7)
Eosinophils Relative: 1 %
HEMATOCRIT: 40.8 % (ref 36.0–46.0)
HEMOGLOBIN: 13.5 g/dL (ref 12.0–15.0)
LYMPHS ABS: 1.9 10*3/uL (ref 0.7–4.0)
Lymphocytes Relative: 27 %
MCH: 30.4 pg (ref 26.0–34.0)
MCHC: 33.1 g/dL (ref 30.0–36.0)
MCV: 91.9 fL (ref 78.0–100.0)
MONOS PCT: 5 %
Monocytes Absolute: 0.4 10*3/uL (ref 0.1–1.0)
NEUTROS ABS: 4.8 10*3/uL (ref 1.7–7.7)
NEUTROS PCT: 67 %
Platelets: 268 10*3/uL (ref 150–400)
RBC: 4.44 MIL/uL (ref 3.87–5.11)
RDW: 12.2 % (ref 11.5–15.5)
WBC: 7.1 10*3/uL (ref 4.0–10.5)

## 2016-01-12 NOTE — ED Notes (Signed)
Pt. presents with multuple complaints : Bruising at legs , muscle stiffness , sore throat /tongue swelling ( no swelling at arrival ) onset last week , mother suspects medication side effects of Viiryd for her anxiety . Denies fever , respirations unlabored .

## 2016-01-12 NOTE — ED Provider Notes (Signed)
CSN: [id number]     Arrival date & time 01/12/16  2012 History   First MD Initiated Contact with Patient 01/12/16 2136     Chief Complaint  Patient presents with  . Medication Reaction     (Consider location/radiation/quality/duration/timing/severity/associated sxs/prior Treatment) Patient is a 25 y.o. female presenting with general illness. The history is provided by the patient and a relative.  Illness Location:  Confusion Severity:  Mild Onset quality:  Gradual Duration:  1 day Timing:  Constant Progression:  Unchanged Chronicity:  New Context:  History of anxiety. Was recently started on Vibryd. Had side effect of throat and tongue swelling from that medication. Discontinued medication early last week. Now with onset of mild confusion at home, as well as some unsteadiness with her gait. Associated symptoms: no abdominal pain, no chest pain, no cough, no diarrhea, no fever, no headaches, no nausea, no shortness of breath and no vomiting     Past Medical History  Diagnosis Date  . Anxiety     Counseling and psychiatry  . Chronic headaches     Sees specialist  . Thyroid disease   . History of lower GI bleeding   . PUD (peptic ulcer disease)   . IBS (irritable bowel syndrome)   . PCOS (polycystic ovarian syndrome)   . Oligomenorrhea   . Goiter   . Hashimoto's thyroiditis   . Obesity   . Dyspepsia   . Hypothyroidism, acquired, autoimmune   . Fatigue   . History of shingles     face right   . History of recurrent UTIs     Sees urologist  . Hx of varicella   . Hashimoto's disease    Past Surgical History  Procedure Laterality Date  . Knee surgery      x 2 femoral.  break  sp fall  7th grade   . Band hemorrhoidectomy    . Wisdom tooth extraction    . Knees     Family History  Problem Relation Age of Onset  . Breast cancer Maternal Grandmother   . Colon cancer Maternal Grandmother   . Colon polyps Maternal Grandmother   . Cancer Maternal Grandmother   .  Diabetes Paternal Grandfather   . Lupus Paternal Grandmother   . Thyroid disease Paternal Grandmother     Hypothyroid without surgery or irradiation.  . Osteopenia Mother   . Obesity Mother    Social History  Substance Use Topics  . Smoking status: Never Smoker   . Smokeless tobacco: Never Used  . Alcohol Use: No   OB History    No data available     Review of Systems  Constitutional: Negative for fever and chills.  Eyes: Negative for visual disturbance.  Respiratory: Negative for cough and shortness of breath.   Cardiovascular: Negative for chest pain.  Gastrointestinal: Negative for nausea, vomiting, abdominal pain and diarrhea.  Genitourinary: Negative for dysuria, vaginal bleeding and vaginal discharge.  Musculoskeletal: Positive for gait problem. Negative for back pain.  Neurological: Negative for seizures, syncope, facial asymmetry, speech difficulty, weakness, light-headedness, numbness and headaches.  Psychiatric/Behavioral: Negative for suicidal ideas and hallucinations. The patient is nervous/anxious.   All other systems reviewed and are negative.     Allergies  Milk-related compounds; Nsaids; Onion; and Penicillins  Home Medications   Prior to Admission medications   Medication Sig Start Date End Date Taking? Authorizing Provider  acetaminophen (TYLENOL) 650 MG CR tablet Take 650-1,300 mg by mouth every 8 (eight) hours as needed for  pain.   Yes Historical Provider, MD  ALPRAZolam (XANAX XR) 0.5 MG 24 hr tablet Take 0.5 mg by mouth 2 (two) times daily as needed for anxiety.    Yes Historical Provider, MD  Calcium Carbonate-Vitamin D (CALCIUM 500 + D PO) Take 1 tablet by mouth daily after breakfast.    Yes Historical Provider, MD  cetirizine (ZYRTEC) 10 MG tablet Take 10 mg by mouth daily.     Yes Historical Provider, MD  diclofenac sodium (VOLTAREN) 1 % GEL Apply 4 g topically 4 (four) times daily as needed. 08/20/15  Yes Madelin Headings, MD   HYDROcodone-acetaminophen (NORCO/VICODIN) 5-325 MG tablet Take 0.25-1 tablets by mouth every 6 (six) hours as needed for moderate pain.   Yes Historical Provider, MD  loperamide (IMODIUM A-D) 2 MG tablet Take as directed Patient taking differently: Take 2 mg by mouth 2 (two) times daily as needed for diarrhea or loose stools (for diarrhea).  06/21/11  Yes Hilarie Fredrickson, MD  Multiple Vitamin (MULTI-VITAMIN PO) Take 1 tablet by mouth daily after breakfast.    Yes Historical Provider, MD  Probiotic Product (PROBIOTIC PO) Take 1 capsule by mouth every morning.    Yes Historical Provider, MD  promethazine (PHENERGAN) 12.5 MG tablet Take 12.5 mg by mouth every 6 (six) hours as needed for nausea or vomiting.   Yes Historical Provider, MD  ranitidine (ZANTAC) 150 MG tablet TAKE 1 TABLET BY MOUTH 2 TIMES DAILY. Patient taking differently: TAKE 1 TABLET BY MOUTH EVERY MORNING 07/14/15  Yes David Stall, MD  [facility] FE 0.4-35 MG-MCG tablet Chew 1 tablet by mouth daily. 12/23/15  Yes Historical Provider, MD  EPINEPHrine 0.3 mg/0.3 mL IJ SOAJ injection Inject 0.3 mLs (0.3 mg total) into the muscle as needed. 08/20/15   Madelin Headings, MD  ondansetron (ZOFRAN-ODT) 4 MG disintegrating tablet Take 1 tablet (4 mg total) by mouth every 8 (eight) hours as needed for nausea or vomiting. Patient not taking: Reported on 04/16/2015 12/05/14   Ozella Rocks, MD  triamcinolone cream (KENALOG) 0.1 % Apply 1 application topically 2 (two) times daily. Patient not taking: Reported on 12/02/2014 09/27/14   Kristian Covey, MD  valACYclovir (VALTREX) 1000 MG tablet TAKE 2 TABLETS BY MOUTH TWICE A DAY OR AS DIRECTED Patient not taking: Reported on 08/19/2015 07/25/15   Madelin Headings, MD   BP 100/71 mmHg  Pulse 79  Temp(Src) 99.6 F (37.6 C) (Oral)  Resp 20  Ht  (1.651 m)  Wt 75.042 kg  BMI 27.53 kg/m2  SpO2 100%  LMP 12/26/2015 (Approximate) Physical Exam  Constitutional: She is oriented to person, place, and time.  She appears well-developed and well-nourished. No distress.  HENT:  Head: Normocephalic and atraumatic.  Eyes: EOM are normal. Pupils are equal, round, and reactive to light.  Neck: Normal range of motion.  Cardiovascular: Normal rate and regular rhythm.   Pulmonary/Chest: No tachypnea. No respiratory distress.  Abdominal: Soft. Normal appearance. There is no tenderness.  Neurological: She is alert and oriented to person, place, and time. She has normal strength and normal reflexes. No cranial nerve deficit or sensory deficit. Coordination and gait normal. GCS eye subscore is 4. GCS verbal subscore is 5. GCS motor subscore is 6.  Skin: Skin is warm and dry.  Psychiatric: Her mood appears not anxious. Her speech is not tangential. She is not actively hallucinating. She does not exhibit a depressed mood. She expresses no homicidal and no suicidal ideation. She  expresses no suicidal plans and no homicidal plans.    ED Course  Procedures (including critical care time) Labs Review Labs Reviewed  COMPREHENSIVE METABOLIC PANEL - Abnormal; Notable for the following:    BUN 5 (*)    All other components within normal limits  URINALYSIS, ROUTINE W REFLEX MICROSCOPIC (NOT AT [facility]) - Abnormal; Notable for the following:    Hgb urine dipstick MODERATE (*)    All other components within normal limits  URINE MICROSCOPIC-ADD ON - Abnormal; Notable for the following:    Squamous Epithelial / LPF 6-30 (*)    Bacteria, UA MANY (*)    All other components within normal limits  CBC WITH DIFFERENTIAL/PLATELET  POC URINE PREG, ED    Imaging Review No results found. I have personally reviewed and evaluated these images and lab results as part of my medical decision-making.   EKG Interpretation None      MDM   Final diagnoses:  Medication adverse effect, initial encounter   25 year old female presenting after change in mental status at home after discontinuing Vibryd. Feel most likely diagnosed  with the patient is withdrawal from Vibryd. Will not suggested the patient to restart the medication at this time as she discontinued it secondary to allergic reaction symptoms.  Patient has not had any fevers and chills. Has no meningeal signs on exam. No focal neurological deficits. Doubt acute pathology such as meningitis, encephalitis, CVA. She has a normal gait.  She has no leukocytosis. No anemia. Electrolytes normal. No evidence of UTI.  Strict return precautions provided. Encouraged her to follow up with her primary care doctor as soon as possible.  Lindalou Hose, MD 01/13/16 [phone]

## 2016-01-13 NOTE — ED Notes (Signed)
Pt ambulated in hallway, tolerated well.

## 2016-01-13 NOTE — Discharge Instructions (Signed)
Your evaluation in the emergency department is reassuring. You have no findings on your exam to indicate an acute injury to your brain. Your lab findings were all within normal limits.  See your primary care doctor as soon as possible for reevaluation.

## 2016-01-13 NOTE — ED Provider Notes (Signed)
I saw and evaluated the patient, reviewed the resident's note and I agree with the findings and plan.   EKG Interpretation None          Results for orders placed or performed during the hospital encounter of 01/12/16  CBC with Differential  Result Value Ref Range   WBC 7.1 4.0 - 10.5 K/uL   RBC 4.44 3.87 - 5.11 MIL/uL   Hemoglobin 13.5 12.0 - 15.0 g/dL   HCT 16.1 09.6 - 04.5 %   MCV 91.9 78.0 - 100.0 fL   MCH 30.4 26.0 - 34.0 pg   MCHC 33.1 30.0 - 36.0 g/dL   RDW 40.9 81.1 - 91.4 %   Platelets 268 150 - 400 K/uL   Neutrophils Relative % 67 %   Neutro Abs 4.8 1.7 - 7.7 K/uL   Lymphocytes Relative 27 %   Lymphs Abs 1.9 0.7 - 4.0 K/uL   Monocytes Relative 5 %   Monocytes Absolute 0.4 0.1 - 1.0 K/uL   Eosinophils Relative 1 %   Eosinophils Absolute 0.0 0.0 - 0.7 K/uL   Basophils Relative 0 %   Basophils Absolute 0.0 0.0 - 0.1 K/uL  Comprehensive metabolic panel  Result Value Ref Range   Sodium 135 135 - 145 mmol/L   Potassium 4.2 3.5 - 5.1 mmol/L   Chloride 103 101 - 111 mmol/L   CO2 25 22 - 32 mmol/L   Glucose, Bld 92 65 - 99 mg/dL   BUN 5 (L) 6 - 20 mg/dL   Creatinine, Ser 7.82 0.44 - 1.00 mg/dL   Calcium 9.5 8.9 - 95.6 mg/dL   Total Protein 6.9 6.5 - 8.1 g/dL   Albumin 3.7 3.5 - 5.0 g/dL   AST 18 15 - 41 U/L   ALT 15 14 - 54 U/L   Alkaline Phosphatase 72 38 - 126 U/L   Total Bilirubin 0.5 0.3 - 1.2 mg/dL   GFR calc non Af Amer >60 >60 mL/min   GFR calc Af Amer >60 >60 mL/min   Anion gap 7 5 - 15  Urinalysis, Routine w reflex microscopic (not at [facility])  Result Value Ref Range   Color, Urine YELLOW YELLOW   APPearance CLEAR CLEAR   Specific Gravity, Urine 1.012 1.005 - 1.030   pH 7.5 5.0 - 8.0   Glucose, UA NEGATIVE NEGATIVE mg/dL   Hgb urine dipstick MODERATE (A) NEGATIVE   Bilirubin Urine NEGATIVE NEGATIVE   Ketones, ur NEGATIVE NEGATIVE mg/dL   Protein, ur NEGATIVE NEGATIVE mg/dL   Nitrite NEGATIVE NEGATIVE   Leukocytes, UA NEGATIVE NEGATIVE  Urine  microscopic-add on  Result Value Ref Range   Squamous Epithelial / LPF 6-30 (A) NONE SEEN   WBC, UA 0-5 0 - 5 WBC/hpf   RBC / HPF 6-30 0 - 5 RBC/hpf   Bacteria, UA MANY (A) NONE SEEN   Results for orders placed or performed during the hospital encounter of 01/12/16  CBC with Differential  Result Value Ref Range   WBC 7.1 4.0 - 10.5 K/uL   RBC 4.44 3.87 - 5.11 MIL/uL   Hemoglobin 13.5 12.0 - 15.0 g/dL   HCT 21.3 08.6 - 57.8 %   MCV 91.9 78.0 - 100.0 fL   MCH 30.4 26.0 - 34.0 pg   MCHC 33.1 30.0 - 36.0 g/dL   RDW 46.9 62.9 - 52.8 %   Platelets 268 150 - 400 K/uL   Neutrophils Relative % 67 %   Neutro Abs 4.8 1.7 - 7.7 K/uL  Lymphocytes Relative 27 %   Lymphs Abs 1.9 0.7 - 4.0 K/uL   Monocytes Relative 5 %   Monocytes Absolute 0.4 0.1 - 1.0 K/uL   Eosinophils Relative 1 %   Eosinophils Absolute 0.0 0.0 - 0.7 K/uL   Basophils Relative 0 %   Basophils Absolute 0.0 0.0 - 0.1 K/uL  Comprehensive metabolic panel  Result Value Ref Range   Sodium 135 135 - 145 mmol/L   Potassium 4.2 3.5 - 5.1 mmol/L   Chloride 103 101 - 111 mmol/L   CO2 25 22 - 32 mmol/L   Glucose, Bld 92 65 - 99 mg/dL   BUN 5 (L) 6 - 20 mg/dL   Creatinine, Ser 6.210.80 0.44 - 1.00 mg/dL   Calcium 9.5 8.9 - 30.810.3 mg/dL   Total Protein 6.9 6.5 - 8.1 g/dL   Albumin 3.7 3.5 - 5.0 g/dL   AST 18 15 - 41 U/L   ALT 15 14 - 54 U/L   Alkaline Phosphatase 72 38 - 126 U/L   Total Bilirubin 0.5 0.3 - 1.2 mg/dL   GFR calc non Af Amer >60 >60 mL/min   GFR calc Af Amer >60 >60 mL/min   Anion gap 7 5 - 15  Urinalysis, Routine w reflex microscopic ([facility])  Result Value Ref Range   Color, Urine YELLOW YELLOW   APPearance CLEAR CLEAR   Specific Gravity, Urine 1.012 1.005 - 1.030   pH 7.5 5.0 - 8.0   Glucose, UA NEGATIVE NEGATIVE mg/dL   Hgb urine dipstick MODERATE (A) NEGATIVE   Bilirubin Urine NEGATIVE NEGATIVE   Ketones, ur NEGATIVE NEGATIVE mg/dL   Protein, ur NEGATIVE NEGATIVE mg/dL   Nitrite NEGATIVE NEGATIVE    Leukocytes, UA NEGATIVE NEGATIVE  Urine microscopic-add on  Result Value Ref Range   Squamous Epithelial / LPF 6-30 (A) NONE SEEN   WBC, UA 0-5 0 - 5 WBC/hpf   RBC / HPF 6-30 0 - 5 RBC/hpf   Bacteria, UA MANY (A) NONE SEEN   No results found.  Patient's lab workup without Any acute findings. We feel that is very possible that her symptoms could be related to the abrupt stopping of the thyroid which she was taking for viiryd anxiety. However not clear whether it is directly related or what to do about it. Patient's psychiatrist will be back in town later today now that it is Tuesday at 1 in the morning. Patient is nontoxic no acute distress. No evidence of any serious of allergic reaction or serious side effects on examination here.   Vanetta MuldersScott Amyr Sluder, MD 01/13/16 [phone]

## 2016-01-19 DIAGNOSIS — M25561 Pain in right knee: Secondary | ICD-10-CM | POA: Diagnosis not present

## 2016-01-19 DIAGNOSIS — M94261 Chondromalacia, right knee: Secondary | ICD-10-CM | POA: Diagnosis not present

## 2016-01-29 DIAGNOSIS — M25561 Pain in right knee: Secondary | ICD-10-CM | POA: Diagnosis not present

## 2016-02-10 DIAGNOSIS — R87610 Atypical squamous cells of undetermined significance on cytologic smear of cervix (ASC-US): Secondary | ICD-10-CM | POA: Diagnosis not present

## 2016-02-10 DIAGNOSIS — R87612 Low grade squamous intraepithelial lesion on cytologic smear of cervix (LGSIL): Secondary | ICD-10-CM | POA: Diagnosis not present

## 2016-02-11 DIAGNOSIS — M1731 Unilateral post-traumatic osteoarthritis, right knee: Secondary | ICD-10-CM | POA: Diagnosis not present

## 2016-02-18 DIAGNOSIS — M1731 Unilateral post-traumatic osteoarthritis, right knee: Secondary | ICD-10-CM | POA: Diagnosis not present

## 2016-03-01 DIAGNOSIS — M2391 Unspecified internal derangement of right knee: Secondary | ICD-10-CM | POA: Diagnosis not present

## 2016-03-01 DIAGNOSIS — M94261 Chondromalacia, right knee: Secondary | ICD-10-CM | POA: Diagnosis not present

## 2016-03-01 DIAGNOSIS — M1731 Unilateral post-traumatic osteoarthritis, right knee: Secondary | ICD-10-CM | POA: Diagnosis not present

## 2016-03-02 DIAGNOSIS — F3111 Bipolar disorder, current episode manic without psychotic features, mild: Secondary | ICD-10-CM | POA: Diagnosis not present

## 2016-03-16 DIAGNOSIS — F3131 Bipolar disorder, current episode depressed, mild: Secondary | ICD-10-CM | POA: Diagnosis not present

## 2016-03-16 DIAGNOSIS — F3111 Bipolar disorder, current episode manic without psychotic features, mild: Secondary | ICD-10-CM | POA: Diagnosis not present

## 2016-03-17 DIAGNOSIS — F3111 Bipolar disorder, current episode manic without psychotic features, mild: Secondary | ICD-10-CM | POA: Diagnosis not present

## 2016-03-24 DIAGNOSIS — F3111 Bipolar disorder, current episode manic without psychotic features, mild: Secondary | ICD-10-CM | POA: Diagnosis not present

## 2016-04-07 DIAGNOSIS — F4323 Adjustment disorder with mixed anxiety and depressed mood: Secondary | ICD-10-CM | POA: Diagnosis not present

## 2016-04-12 DIAGNOSIS — M1711 Unilateral primary osteoarthritis, right knee: Secondary | ICD-10-CM | POA: Diagnosis not present

## 2016-04-12 DIAGNOSIS — M899 Disorder of bone, unspecified: Secondary | ICD-10-CM | POA: Diagnosis not present

## 2016-04-26 ENCOUNTER — Ambulatory Visit: Payer: BLUE CROSS/BLUE SHIELD | Admitting: "Endocrinology

## 2016-04-27 DIAGNOSIS — F3111 Bipolar disorder, current episode manic without psychotic features, mild: Secondary | ICD-10-CM | POA: Diagnosis not present

## 2016-04-27 DIAGNOSIS — F3131 Bipolar disorder, current episode depressed, mild: Secondary | ICD-10-CM | POA: Diagnosis not present

## 2016-04-29 DIAGNOSIS — M2241 Chondromalacia patellae, right knee: Secondary | ICD-10-CM | POA: Diagnosis not present

## 2016-04-29 DIAGNOSIS — M222X1 Patellofemoral disorders, right knee: Secondary | ICD-10-CM | POA: Diagnosis not present

## 2016-04-29 DIAGNOSIS — G8918 Other acute postprocedural pain: Secondary | ICD-10-CM | POA: Diagnosis not present

## 2016-04-29 DIAGNOSIS — M259 Joint disorder, unspecified: Secondary | ICD-10-CM | POA: Diagnosis not present

## 2016-04-29 DIAGNOSIS — M948X6 Other specified disorders of cartilage, lower leg: Secondary | ICD-10-CM | POA: Diagnosis not present

## 2016-04-29 DIAGNOSIS — M1711 Unilateral primary osteoarthritis, right knee: Secondary | ICD-10-CM | POA: Diagnosis not present

## 2016-04-29 DIAGNOSIS — M898X6 Other specified disorders of bone, lower leg: Secondary | ICD-10-CM | POA: Diagnosis not present

## 2016-05-03 DIAGNOSIS — M1711 Unilateral primary osteoarthritis, right knee: Secondary | ICD-10-CM | POA: Diagnosis not present

## 2016-05-06 DIAGNOSIS — M1711 Unilateral primary osteoarthritis, right knee: Secondary | ICD-10-CM | POA: Diagnosis not present

## 2016-05-10 DIAGNOSIS — M1711 Unilateral primary osteoarthritis, right knee: Secondary | ICD-10-CM | POA: Diagnosis not present

## 2016-05-13 DIAGNOSIS — M1711 Unilateral primary osteoarthritis, right knee: Secondary | ICD-10-CM | POA: Diagnosis not present

## 2016-05-17 DIAGNOSIS — M1711 Unilateral primary osteoarthritis, right knee: Secondary | ICD-10-CM | POA: Diagnosis not present

## 2016-05-20 DIAGNOSIS — M1711 Unilateral primary osteoarthritis, right knee: Secondary | ICD-10-CM | POA: Diagnosis not present

## 2016-05-24 DIAGNOSIS — M1711 Unilateral primary osteoarthritis, right knee: Secondary | ICD-10-CM | POA: Diagnosis not present

## 2016-05-26 DIAGNOSIS — M1711 Unilateral primary osteoarthritis, right knee: Secondary | ICD-10-CM | POA: Diagnosis not present

## 2016-05-31 DIAGNOSIS — M1711 Unilateral primary osteoarthritis, right knee: Secondary | ICD-10-CM | POA: Diagnosis not present

## 2016-06-07 DIAGNOSIS — M1711 Unilateral primary osteoarthritis, right knee: Secondary | ICD-10-CM | POA: Diagnosis not present

## 2016-06-08 DIAGNOSIS — F3131 Bipolar disorder, current episode depressed, mild: Secondary | ICD-10-CM | POA: Diagnosis not present

## 2016-06-08 DIAGNOSIS — F3111 Bipolar disorder, current episode manic without psychotic features, mild: Secondary | ICD-10-CM | POA: Diagnosis not present

## 2016-06-08 DIAGNOSIS — E063 Autoimmune thyroiditis: Secondary | ICD-10-CM | POA: Diagnosis not present

## 2016-06-09 DIAGNOSIS — M1711 Unilateral primary osteoarthritis, right knee: Secondary | ICD-10-CM | POA: Diagnosis not present

## 2016-06-09 LAB — THYROGLOBULIN ANTIBODY PANEL
Thyroglobulin: 11.2 ng/mL (ref 2.8–40.9)
Thyroperoxidase Ab SerPl-aCnc: 2 IU/mL (ref <9)

## 2016-06-09 LAB — T4, FREE: FREE T4: 1 ng/dL (ref 0.8–1.8)

## 2016-06-09 LAB — T3, FREE: T3 FREE: 3 pg/mL (ref 2.3–4.2)

## 2016-06-09 LAB — TSH: TSH: 0.64 m[IU]/L

## 2016-06-14 DIAGNOSIS — M1711 Unilateral primary osteoarthritis, right knee: Secondary | ICD-10-CM | POA: Diagnosis not present

## 2016-06-16 ENCOUNTER — Ambulatory Visit (INDEPENDENT_AMBULATORY_CARE_PROVIDER_SITE_OTHER): Payer: BLUE CROSS/BLUE SHIELD | Admitting: "Endocrinology

## 2016-06-16 ENCOUNTER — Encounter (INDEPENDENT_AMBULATORY_CARE_PROVIDER_SITE_OTHER): Payer: Self-pay | Admitting: "Endocrinology

## 2016-06-16 VITALS — BP 110/81 | HR 90 | Wt 168.0 lb

## 2016-06-16 DIAGNOSIS — R5383 Other fatigue: Secondary | ICD-10-CM | POA: Diagnosis not present

## 2016-06-16 DIAGNOSIS — E663 Overweight: Secondary | ICD-10-CM

## 2016-06-16 DIAGNOSIS — F3162 Bipolar disorder, current episode mixed, moderate: Secondary | ICD-10-CM

## 2016-06-16 DIAGNOSIS — R1013 Epigastric pain: Secondary | ICD-10-CM

## 2016-06-16 DIAGNOSIS — E063 Autoimmune thyroiditis: Secondary | ICD-10-CM

## 2016-06-16 DIAGNOSIS — E559 Vitamin D deficiency, unspecified: Secondary | ICD-10-CM

## 2016-06-16 DIAGNOSIS — E049 Nontoxic goiter, unspecified: Secondary | ICD-10-CM | POA: Diagnosis not present

## 2016-06-16 NOTE — Patient Instructions (Signed)
Follow up visit in 4 months. Please repeat lab tests 1-2 weeks prior. 

## 2016-06-16 NOTE — Progress Notes (Signed)
Subjective:  Patient Name: Catherine [name] Date of Birth: [birth year]  MRN: [id number]  Catherine [name]  presents to the office today for follow-up of her oligomenorrhea, PCOS, goiter, Hashimoto's thyroiditis, obesity, nausea and vomiting, dyspepsia, transient hypothyroidism, fatigue, anxiety and depression.   HISTORY OF PRESENT ILLNESS:   Catherine [name] is a 25 y.o. Caucasian young woman.  Catherine [name] was accompanied by her mother.  1. The patient was first referred to me on 09/09/06 by her gynecologist, Dr. Dian Queen, for evaluation and management of secondary amenorrhea. She was then 25 years old.  A. Patient had been essentially healthy up through age 32-13, when she underwent menarche. Her menstrual cycles had never been regular. Her last normal menstrual period occurred in about 2005. She was not on any oral contraceptives  or contraceptive implants at that time. Another obstetrician put her on oral contraceptives at that point. She gained about 20 pounds in weight in that first year on oral contraceptives. She started evaluation with Dr. Helane Rima in 2007 or so. Dr. Helane Rima put her on Yaz initially, but later converted her to [facility].The patient's past medical history was positive for a lot of depression, mood swings, and panic attacks. She also had 2 knee surgeries in the seventh grade. She did not do any type of routine physical activity. Family history was positive for diabetes in the paternal grandfather and hypothyroidism in the paternal grandmother.  Father had severe depression and anxiety. Maternal grandmother had colon cancer. Mother was obese. [Addendum 08/19/15: Mother had PCOS as a young woman weighting 84 pounds.] Mother also had endometriosis. As I later learned on 07/10/12, one of Catherine [name]'s mother's first cousins had recently developed Addison's Disease. [Addendum 06/16/16: Mother suspects that several members of her family have mild bipolar disorder.]  B. On physical examination, her height was  at the 35th percentile and her weight at 177.6 pounds was at the 93rd percentile. Her BMI was 30.7. She had a 25+ gram thyroid gland. She was tender in the right lobe. She also had 1+ acanthosis nigricans. Previous laboratory data from 06/21/06 showed a normal CMP. Her TSH was 2.05 and her free T4 was 1.18. A serum glucose was 91 with a corresponding insulin level of 95, which was very excessive. Laboratory data at that visit included a blood glucose of 79 and hemoglobin A1c of 5.0%. Subsequent lab tests on 09/12/06 showed a fasting glucose of 72 and a fasting insulin of 22, again excessive. It appeared at that time that the patient was mildly obese. Her level of obesity was high enough, however, to cause a significant degree of insulin resistance and hyperinsulinemia. Her level of obesity may also have been high enough to cause oligomenorrhea. She had a goiter and clinical evidence of Hashimoto's disease, but she was euthyroid. I talked with the patient and her mother about our Eat Right Diet plan. I also talked about exercising 45-60 minutes a day. I started her on metformin, 500 mg, twice daily.  2. During the past 9 years, Catherine [name]'s weight has fluctuated up and down, varying between 150.7-179.4 lbs. She has continued to have flare ups of Hashimoto's disease. On 02/12/08 her TSH rose to 4.493, but then subsequently normalized. She has been euthyroid for most of the past 8 years. At times she's been on birth control patches, but most times has been on birth control pills. Her dyspepsia is better when she takes her ranitidine and worse when she does not. Her anxiety and depression have been major problems for her. She  has also had chronic fatigue, recurrent UTIs, and a myriad of gastrointestinal symptoms.   3. The patient's last [facility] visit was on 12/23/15.  A. In the interim she has been diagnosed with bipolar disorder I. She is now taking Tegretol, 200 mg, once daily and Lamictal, 300 mg, once daily, plus Xanax  as needed. She is feeling better on her new psych medicines.  She is beginning to be more comfortable leaving her home.   B. In September she had a lateral release procedure of her right knee. She has now resumed limited walking and PT.    C. Her abdominal pains have recurred during the medication transition.    D. She continues to have intermittent problems with stomach upset and diarrhea, which she attributes to her new medications.    E. She continues to take ranitidine twice daily; metformin XR, 1000 mg/day, a good MVI, and her calcium/vitamin D supplement almost every day.  4. Pertinent Review of Systems:  Constitutional: Catherine [name] feels "a lot more balanced", but still somewhat uncomfortable with her bipolar Dx. She recognizes, however, that since being on the new medicines she is much better able to concentrate on and perform productive ADLs. She is also happier.   Eyes: Vision is good. There are no significant eye complaints. Neck: The patient has occasional anterior neck swelling and tenderness, but no difficulty swallowing.  Heart: She had some faster heart rate when she is anxious. Heart rate increases with exercise or other physical activity. The patient has no other complaints of palpitations, irregular heat beats, chest pain, or chest pressure. Gastrointestinal: As above. She no longer feels hungry (belly hunger). She has no complaints of  acid reflux.  Legs: Right knee pains have improved since surgery. She no longer has leg cramps. Muscle mass and strength seem normal. There are no complaints of numbness, tingling, burning, or pain. No edema is noted. Feet: There are no obvious foot problems. There are no complaints of numbness, tingling, burning, or pain. No edema is noted. GYN: LMP was two weeks ago. She still takes OCPs, Balsiva (0.4 mg norethindrone/0.035 ethinyl estradiol)    PAST MEDICAL, FAMILY, AND SOCIAL HISTORY:  Past Medical History:  Diagnosis Date  . Anxiety     Counseling and psychiatry  . Chronic headaches    Sees specialist  . Dyspepsia   . Fatigue   . Goiter   . Hashimoto's disease   . Hashimoto's thyroiditis   . History of lower GI bleeding   . History of recurrent UTIs    Sees urologist  . History of shingles    face right   . Hx of varicella   . Hypothyroidism, acquired, autoimmune   . IBS (irritable bowel syndrome)   . Obesity   . Oligomenorrhea   . PCOS (polycystic ovarian syndrome)   . PUD (peptic ulcer disease)   . Thyroid disease     Family History  Problem Relation Age of Onset  . Breast cancer Maternal Grandmother   . Colon cancer Maternal Grandmother   . Colon polyps Maternal Grandmother   . Cancer Maternal Grandmother   . Diabetes Paternal Grandfather   . Lupus Paternal Grandmother   . Thyroid disease Paternal Grandmother     Hypothyroid without surgery or irradiation.  . Osteopenia Mother   . Obesity Mother      Current Outpatient Prescriptions:  .  ALPRAZolam (XANAX XR) 0.5 MG 24 hr tablet, Take 0.5 mg by mouth 2 (two) times daily as needed for  anxiety. , Disp: , Rfl:  .  Calcium Carbonate-Vitamin D (CALCIUM 500 + D PO), Take 1 tablet by mouth daily after breakfast. , Disp: , Rfl:  .  cetirizine (ZYRTEC) 10 MG tablet, Take 10 mg by mouth daily.  , Disp: , Rfl:  .  HYDROcodone-acetaminophen (NORCO/VICODIN) 5-325 MG tablet, Take 0.25-1 tablets by mouth every 6 (six) hours as needed for moderate pain., Disp: , Rfl:  .  loperamide (IMODIUM A-D) 2 MG tablet, Take as directed (Patient taking differently: Take 2 mg by mouth 2 (two) times daily as needed for diarrhea or loose stools (for diarrhea). ), Disp: 1 tablet, Rfl: 0 .  Probiotic Product (PROBIOTIC PO), Take 1 capsule by mouth every morning. , Disp: , Rfl:  .  promethazine (PHENERGAN) 12.5 MG tablet, Take 12.5 mg by mouth every 6 (six) hours as needed for nausea or vomiting., Disp: , Rfl:  .  ranitidine (ZANTAC) 150 MG tablet, TAKE 1 TABLET BY MOUTH 2 TIMES  DAILY. (Patient taking differently: TAKE 1 TABLET BY MOUTH EVERY MORNING), Disp: 60 tablet, Rfl: 6 .  WYMZYA FE 0.4-35 MG-MCG tablet, Chew 1 tablet by mouth daily., Disp: , Rfl: 7 .  acetaminophen (TYLENOL) 650 MG CR tablet, Take 650-1,300 mg by mouth every 8 (eight) hours as needed for pain., Disp: , Rfl:  .  diclofenac sodium (VOLTAREN) 1 % GEL, Apply 4 g topically 4 (four) times daily as needed. (Patient not taking: Reported on 06/16/2016), Disp: 200 g, Rfl: 1 .  EPINEPHrine 0.3 mg/0.3 mL IJ SOAJ injection, Inject 0.3 mLs (0.3 mg total) into the muscle as needed. (Patient not taking: Reported on 06/16/2016), Disp: 2 Device, Rfl: 0 .  Multiple Vitamin (MULTI-VITAMIN PO), Take 1 tablet by mouth daily after breakfast. , Disp: , Rfl:  .  ondansetron (ZOFRAN-ODT) 4 MG disintegrating tablet, Take 1 tablet (4 mg total) by mouth every 8 (eight) hours as needed for nausea or vomiting. (Patient not taking: Reported on 06/16/2016), Disp: 20 tablet, Rfl: 0 .  triamcinolone cream (KENALOG) 0.1 %, Apply 1 application topically 2 (two) times daily. (Patient not taking: Reported on 06/16/2016), Disp: 30 g, Rfl: 0 .  valACYclovir (VALTREX) 1000 MG tablet, TAKE 2 TABLETS BY MOUTH TWICE A DAY OR AS DIRECTED (Patient not taking: Reported on 06/16/2016), Disp: 30 tablet, Rfl: 2  Allergies as of 06/16/2016 - Review Complete 06/16/2016  Allergen Reaction Noted  . Milk-related compounds Anaphylaxis 01/20/2013  . Nsaids Other (See Comments) 03/29/2011  . Onion Diarrhea and Nausea Only 01/12/2016  . Penicillins Other (See Comments) 07/24/2011    1. Work and Family: She is currently unemployed, but would like to resume [employer].   2. Activities: She has resumed limited physical activity. 3. Smoking, alcohol, or drugs: None 4. Primary Care Provider: Dr. Shanon Ace, MD 5. Psychiatrist: Dr. Chucky May, MD - phone [phone], fax [phone] 6. GYN: Dr. Dian Queen, MD  REVIEW OF SYSTEMS: There are no other  significant problems involving Catherine [name]'s other body systems.   Objective:  Vital Signs:  BP 110/81   Pulse 90   Wt 168 lb (76.2 kg)   BMI 27.96 kg/m     Ht Readings from Last 3 Encounters:  01/12/16 _0  (1.651 m)  08/20/15 _1  (1.626 m)  05/31/14 5' 4.5" (1.638 m)   Wt Readings from Last 3 Encounters:  06/16/16 168 lb (76.2 kg)  01/12/16 165 lb 7 oz (75 kg)  12/23/15 164 lb 9.6 oz (74.7 kg)  Body surface area is 1.87 meters squared.  Facility age limit for growth percentiles is 20 years. Facility age limit for growth percentiles is 20 years.  PHYSICAL EXAM:  Constitutional: Catherine [name] is bright and upbeat today. She is fairly positive and laughs frequently. Her affect and insight are surprisingly normal. She has gained 2.5 pounds in the past 4 months.  Face: The face appears normal.  Eyes: There is no obvious arcus or proptosis. Moisture appears normal. Mouth: The oropharynx and tongue appear normal. Dentition appears to be normal for age. Oral moisture is normal. She has no hyperpigmentation of the oral mucosa. Neck: The neck appears to be visibly normal. No carotid bruits are noted. The thyroid gland has decreased in size to about 21 grams. The consistency of the thyroid gland is relatively full. The thyroid gland is slightly tender to palpation bilaterally today, more on the left than on the right. She has 1+ acanthosis nigricans.  Lungs: The lungs are clear to auscultation. Air movement is good. Heart: Heart rate and rhythm are regular. Heart sounds S1 and S2 are normal. I did not appreciate any pathologic cardiac murmurs. Abdomen: The abdomen is enlarged. Bowel sounds are normal. There is no obvious hepatomegaly, splenomegaly, or other mass effect. She has no tenderness to palpation.   Arms: Muscle size and bulk are normal for age. Hands: There is a trace tremor today. Phalangeal and metacarpophalangeal joints are normal. Palmar muscles are normal. Palmar skin is normal.  Palmar moisture is also normal. Nails are normally pink. There is no palmar hyperpigmentation.  Legs: Muscles appear normal for age. No edema is present. Neurologic: Strength is normal for age in both the upper and lower extremities. Muscle tone is normal. Sensation to touch is normal in both legs.     LAB DATA:   Labs 06/08/16: TSH 0.64, free T4 1.0, free T3 3.0, TPO antibody 2, anti-thyroglobulin antibody <1  Labs 12/23/15: HbA1c 4.7%  Labs 08/14/15: TSH 0.505, free T4 1.24, free T3 2.7, TPO antibody 1, thyroglobulin antibody <1; 25-OH vitamin D 44, PTH 32, calcium 9.4  Labs 04/11/15: TSH 0.910, free T4 0.89, free T3 2.9; testosterone 35; 25-OH vitamin D 38  Labs 11/27/14: HbA1c 5.2%; calcium 9.2, PTH 50, 25-OH vitamin D 34, 1,25-dihydroxy vitamin D 116;  TSH 1.094, free T4 1.15, free T3 2.9;   Labs 05/23/14: TSH 0.570, free T4 1.10, free T3 3.3; HbA1c 4.8%; calcium 9.8, PTH 36, 25-hydroxy vitamin D 56  Labs 07/09/13: 25-hydroxy Vitamin D 31; calcium 9.2, PTH 64.3;   Labs 08/30/13; TSH 0.778, free T4 1.17, free T3  3.4, TSI 43  Labs 10/23/12: TSH 0.812, free T4 1.02, free T3 2.9; iron 114, 25-hydroxy vitamin D 20, calcium 9.5, PTH 40.6  Labs 07/04/12 at 9 AM: ACTH 24, cortisol 17.3  labs 02/18/12: TSH 1.208, free T4, free T3 3.0  Bone mineral density 09/22/12: Osteopenia of the spine based upon T-score for age   Assessment and Plan:   ASSESSMENT:  1. Thyroiditis:   A. The patient's Hashimoto's disease is clinically mildly active bilaterally again today. The fact that she continues to have active thyroiditis indicates that she will probably eventually become hypothyroid.   B. The fact that both her TSH and free T4 decreased in parallel from April to August 2016 indicated that she had had a recent flare up of Hashimoto's thyroiditis. The shift of all 3 TFTs downward together from January to October 2015 was also pathognomonic for a flare up of Hashimoto's disease  at that time.  C. Her  TFTs last week were within normal limits, at about the 90% of the normal thyroid hormone range.   2. Goiter: The thyroid gland has decreased in size, c/w this current flare up of thyroiditis. The process of waxing and waning of thyroid gland size shows that she has evolving Hashimoto's Thyroiditis.  3. Obesity/overweight: She has regained a little weight. Now that her knee is better and she is on medication for her bipolar disorder, she will be able to exercise more.  4. Dyspepsia: This problem has improved with the combination of ranitidine and metformin, but she needs to take both medications regularly.   5. Fatigue: She is doing fairly well today, but her fatigue varies greatly with her depressive phase of BPD.  6. Bipolar disorder (BPD: It is now apparent that she has had more depressive aspects of BPD. She is doing much better today.  7. Vitamin D deficiency/excess: Her 25-OH vitamin D level in December was good. I'd like her to have a higher calcium level to facilitate better calcium uptake by her bones.   PLAN:  1. Diagnostic: TFTs, calcium, PTH, vitamin D prior to next visit.  2. Therapeutic: Take one good MVI per day, either Centrum for Women or One-A-Day for Women. Continue the calcium, magnesium, and vitamin D supplements. Get an hour or more of exercise per day. Continue ranitidine, 150 mg, twice daily and metformin 1000 mg XR once daily.  3. Patient education: We discussed thyroiditis, goiter, vitamin D deficiency, secondary hyperparathyroidism, thyroiditis, and dyspepsia. We also discussed her bipolar disorder at great length, to include how genetics and epigenetics can cause variable expression of familial genetic traits, both physical traits and mental health traits.    4. Follow-up: 4 months  Level of Service: This visit lasted in excess of 55 minutes. More than 50% of the visit was devoted to counseling.  Sherrlyn Hock, MD, CDE Adult and Pediatric  Endocrinology 06/16/2016 1:58 PM

## 2016-06-17 DIAGNOSIS — M1711 Unilateral primary osteoarthritis, right knee: Secondary | ICD-10-CM | POA: Diagnosis not present

## 2016-07-01 DIAGNOSIS — F319 Bipolar disorder, unspecified: Secondary | ICD-10-CM | POA: Diagnosis not present

## 2016-07-01 DIAGNOSIS — F411 Generalized anxiety disorder: Secondary | ICD-10-CM | POA: Diagnosis not present

## 2016-07-05 ENCOUNTER — Ambulatory Visit: Payer: BLUE CROSS/BLUE SHIELD | Admitting: Internal Medicine

## 2016-07-05 ENCOUNTER — Ambulatory Visit (INDEPENDENT_AMBULATORY_CARE_PROVIDER_SITE_OTHER): Payer: BLUE CROSS/BLUE SHIELD | Admitting: Internal Medicine

## 2016-07-05 ENCOUNTER — Encounter: Payer: Self-pay | Admitting: Internal Medicine

## 2016-07-05 VITALS — BP 112/70 | HR 83 | Temp 98.5°F | Wt 164.4 lb

## 2016-07-05 DIAGNOSIS — J019 Acute sinusitis, unspecified: Secondary | ICD-10-CM

## 2016-07-05 DIAGNOSIS — J069 Acute upper respiratory infection, unspecified: Secondary | ICD-10-CM

## 2016-07-05 MED ORDER — DOXYCYCLINE HYCLATE 100 MG PO TABS: 100.0000 mg | ORAL_TABLET | Freq: Two times a day (BID) | ORAL | 0 refills | Status: DC

## 2016-07-05 NOTE — Progress Notes (Deleted)
No chief complaint on file.   HPI: Catherine MorinKristen P [name] 25 y.o.  ROS: See pertinent positives and negatives per HPI.  Past Medical History:  Diagnosis Date  . Anxiety    Counseling and psychiatry  . Chronic headaches    Sees specialist  . Dyspepsia   . Fatigue   . Goiter   . Hashimoto's disease   . Hashimoto's thyroiditis   . History of lower GI bleeding   . History of recurrent UTIs    Sees urologist  . History of shingles    face right   . Hx of varicella   . Hypothyroidism, acquired, autoimmune   . IBS (irritable bowel syndrome)   . Obesity   . Oligomenorrhea   . PCOS (polycystic ovarian syndrome)   . PUD (peptic ulcer disease)   . Thyroid disease     Family History  Problem Relation Age of Onset  . Breast cancer Maternal Grandmother   . Colon cancer Maternal Grandmother   . Colon polyps Maternal Grandmother   . Cancer Maternal Grandmother   . Diabetes Paternal Grandfather   . Lupus Paternal Grandmother   . Thyroid disease Paternal Grandmother     Hypothyroid without surgery or irradiation.  . Osteopenia Mother   . Obesity Mother     Social History   Social History  . Marital status: Single    Spouse name: N/A  . Number of children: 0  . Years of education: N/A   Occupational History  . student    Social History Main Topics  . Smoking status: Never Smoker  . Smokeless tobacco: Never Used  . Alcohol use No  . Drug use: No  . Sexual activity: Yes    Birth control/ protection: Pill   Other Topics Concern  . Not on file   Social History Narrative   [place] [facility]. Household to 3 at home 10 hours of sleep   Negative alcohol tobacco some caffeine occasionally   Fifth year senior 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 Will be difficult to go back financially.    Outpatient Medications Prior to Visit  Medication Sig Dispense Refill  . acetaminophen (TYLENOL)  650 MG CR tablet Take 650-1,300 mg by mouth every 8 (eight) hours as needed for pain.    [name]. ALPRAZolam (XANAX XR) 0.5 MG 24 hr tablet Take 0.5 mg by mouth 2 (two) times daily as needed for anxiety.     . Calcium Carbonate-Vitamin D (CALCIUM 500 + D PO) Take 1 tablet by mouth daily after breakfast.     . cetirizine (ZYRTEC) 10 MG tablet Take 10 mg by mouth daily.      . diclofenac sodium (VOLTAREN) 1 % GEL Apply 4 g topically 4 (four) times daily as needed. (Patient not taking: Reported on 06/16/2016) 200 g 1  . EPINEPHrine 0.3 mg/0.3 mL IJ SOAJ injection Inject 0.3 mLs (0.3 mg total) into the muscle as needed. (Patient not taking: Reported on 06/16/2016) 2 Device 0  . HYDROcodone-acetaminophen (NORCO/VICODIN) 5-325 MG tablet Take 0.25-1 tablets by mouth every 6 (six) hours as needed for moderate pain.    [name]. loperamide (IMODIUM A-D) 2 MG tablet Take as directed (Patient taking differently: Take 2 mg by mouth 2 (two) times daily as needed for diarrhea or loose stools (for diarrhea). ) 1 tablet 0  . Multiple Vitamin (MULTI-VITAMIN PO) Take 1 tablet by  mouth daily after breakfast.     . ondansetron (ZOFRAN-ODT) 4 MG disintegrating tablet Take 1 tablet (4 mg total) by mouth every 8 (eight) hours as needed for nausea or vomiting. (Patient not taking: Reported on 06/16/2016) 20 tablet 0  . Probiotic Product (PROBIOTIC PO) Take 1 capsule by mouth every morning.     . promethazine (PHENERGAN) 12.5 MG tablet Take 12.5 mg by mouth every 6 (six) hours as needed for nausea or vomiting.    . ranitidine (ZANTAC) 150 MG tablet TAKE 1 TABLET BY MOUTH 2 TIMES DAILY. (Patient taking differently: TAKE 1 TABLET BY MOUTH EVERY MORNING) 60 tablet 6  . triamcinolone cream (KENALOG) 0.1 % Apply 1 application topically 2 (two) times daily. (Patient not taking: Reported on 06/16/2016) 30 g 0  . valACYclovir (VALTREX) 1000 MG tablet TAKE 2 TABLETS BY MOUTH TWICE A DAY OR AS DIRECTED (Patient not taking: Reported on 06/16/2016) 30 tablet  2  . WYMZYA FE 0.4-35 MG-MCG tablet Chew 1 tablet by mouth daily.  7   No facility-administered medications prior to visit.      EXAM:  There were no vitals taken for this visit.  There is no height or weight on file to calculate BMI.  GENERAL: vitals reviewed and listed above, alert, oriented, appears well hydrated and in no acute distress HEENT: atraumatic, conjunctiva  clear, no obvious abnormalities on inspection of external nose and ears OP : no lesion edema or exudate  NECK: no obvious masses on inspection palpation  LUNGS: clear to auscultation bilaterally, no wheezes, rales or rhonchi, good air movement CV: HRRR, no clubbing cyanosis or  peripheral edema nl cap refill  MS: moves all extremities without noticeable focal  abnormality PSYCH: pleasant and cooperative, no obvious depression or anxiety  ASSESSMENT AND PLAN:  Discussed the following assessment and plan:  No diagnosis found.  -Patient advised to return or notify health care team  if symptoms worsen ,persist or new concerns arise.  There are no Patient Instructions on file for this visit.   Neta MendsWanda K. Yanel Dombrosky M.D.

## 2016-07-05 NOTE — Progress Notes (Signed)
Pre visit review using our clinic review tool, if applicable. No additional management support is needed unless otherwise documented below in the visit note. 

## 2016-07-05 NOTE — Patient Instructions (Signed)
Treat for bacterial sinusitis   Warm compresses saline nose spray and flonase may help  Lung exam is reassuring today    Sinusitis, Adult Sinusitis is soreness and inflammation of your sinuses. Sinuses are hollow spaces in the bones around your face. Your sinuses are located:  Around your eyes.  In the middle of your forehead.  Behind your nose.  In your cheekbones. Your sinuses and nasal passages are lined with a stringy fluid (mucus). Mucus normally drains out of your sinuses. When your nasal tissues become inflamed or swollen, the mucus can become trapped or blocked so air cannot flow through your sinuses. This allows bacteria, viruses, and funguses to grow, which leads to infection. Sinusitis can develop quickly and last for 7?10 days (acute) or for more than 12 weeks (chronic). Sinusitis often develops after a cold. What are the causes? This condition is caused by anything that creates swelling in the sinuses or stops mucus from draining, including:  Allergies.  Asthma.  Bacterial or viral infection.  Abnormally shaped bones between the nasal passages.  Nasal growths that contain mucus (nasal polyps).  Narrow sinus openings.  Pollutants, such as chemicals or irritants in the air.  A foreign object stuck in the nose.  A fungal infection. This is rare. What increases the risk? The following factors may make you more likely to develop this condition:  Having allergies or asthma.  Having had a recent cold or respiratory tract infection.  Having structural deformities or blockages in your nose or sinuses.  Having a weak immune system.  Doing a lot of swimming or diving.  Overusing nasal sprays.  Smoking. What are the signs or symptoms? The main symptoms of this condition are pain and a feeling of pressure around the affected sinuses. Other symptoms include:  Upper toothache.  Earache.  Headache.  Bad breath.  Decreased sense of smell and taste.  A  cough that may get worse at night.  Fatigue.  Fever.  Thick drainage from your nose. The drainage is often green and it may contain pus (purulent).  Stuffy nose or congestion.  Postnasal drip. This is when extra mucus collects in the throat or back of the nose.  Swelling and warmth over the affected sinuses.  Sore throat.  Sensitivity to light. How is this diagnosed? This condition is diagnosed based on symptoms, a medical history, and a physical exam. To find out if your condition is acute or chronic, your health care provider may:  Look in your nose for signs of nasal polyps.  Tap over the affected sinus to check for signs of infection.  View the inside of your sinuses using an imaging device that has a light attached (endoscope). If your health care provider suspects that you have chronic sinusitis, you may also:  Be tested for allergies.  Have a sample of mucus taken from your nose (nasal culture) and checked for bacteria.  Have a mucus sample examined to see if your sinusitis is related to an allergy. If your sinusitis does not respond to treatment and it lasts longer than 8 weeks, you may have an MRI or CT scan to check your sinuses. These scans also help to determine how severe your infection is. In rare cases, a bone biopsy may be done to rule out more serious types of fungal sinus disease. How is this treated? Treatment for sinusitis depends on the cause and whether your condition is chronic or acute. If a virus is causing your sinusitis, your symptoms  will go away on their own within 10 days. You may be given medicines to relieve your symptoms, including:  Topical nasal decongestants. They shrink swollen nasal passages and let mucus drain from your sinuses.  Antihistamines. These drugs block inflammation that is triggered by allergies. This can help to ease swelling in your nose and sinuses.  Topical nasal corticosteroids. These are nasal sprays that ease  inflammation and swelling in your nose and sinuses.  Nasal saline washes. These rinses can help to get rid of thick mucus in your nose. If your condition is caused by bacteria, you will be given an antibiotic medicine. If your condition is caused by a fungus, you will be given an antifungal medicine. Surgery may be needed to correct underlying conditions, such as narrow nasal passages. Surgery may also be needed to remove polyps. Follow these instructions at home: Medicines  Take, use, or apply over-the-counter and prescription medicines only as told by your health care provider. These may include nasal sprays.  If you were prescribed an antibiotic medicine, take it as told by your health care provider. Do not stop taking the antibiotic even if you start to feel better. Hydrate and Humidify  Drink enough water to keep your urine clear or pale yellow. Staying hydrated will help to thin your mucus.  Use a cool mist humidifier to keep the humidity level in your home above 50%.  Inhale steam for 10-15 minutes, 3-4 times a day or as told by your health care provider. You can do this in the bathroom while a hot shower is running.  Limit your exposure to cool or dry air. Rest  Rest as much as possible.  Sleep with your head raised (elevated).  Make sure to get enough sleep each night. General instructions  Apply a warm, moist washcloth to your face 3-4 times a day or as told by your health care provider. This will help with discomfort.  Wash your hands often with soap and water to reduce your exposure to viruses and other germs. If soap and water are not available, use hand sanitizer.  Do not smoke. Avoid being around people who are smoking (secondhand smoke).  Keep all follow-up visits as told by your health care provider. This is important. Contact a health care provider if:  You have a fever.  Your symptoms get worse.  Your symptoms do not improve within 10 days. Get help right  away if:  You have a severe headache.  You have persistent vomiting.  You have pain or swelling around your face or eyes.  You have vision problems.  You develop confusion.  Your neck is stiff.  You have trouble breathing. This information is not intended to replace advice given to you by your health care provider. Make sure you discuss any questions you have with your health care provider. Document Released: 08/02/2005 Document Revised: 03/28/2016 Document Reviewed: 05/28/2015 Elsevier Interactive Patient Education  2017 [employer]

## 2016-07-05 NOTE — Progress Notes (Signed)
Chief Complaint  Patient presents with  . Cough    Sx ongoing for 8-9 days.  . Sore Throat  . Nasal Congestion  . Ear Pain  . Fatigue  . Generalized Body Aches  . Nausea  . Sinus Pain/Pressure  . Chills    HPI: Catherine MorinKristen P [name] 25 y.o.  sda  8-9 days  Onset  Like  A sinus congestion    No fever achy  And then sinus infection sx and cough and  Sore throat   Tired  ? otc meds  Not getting better    g dm   Combo .  Coughing  Makes hard to sleep and then  Sinus congestion .both sides of face. Symptoms are getting worse instead of better after some minor improvement. No fever and chills but her over-the-counter is really helping her she feels nauseous and tired with ear pain. Is taking Mucinex DM as one of her meds. Her antidepressant medicines have changed shoes able to take more cold medicines.  ROS: See pertinent positives and negatives per HPI.  Past Medical History:  Diagnosis Date  . Anxiety    Counseling and psychiatry  . Chronic headaches    Sees specialist  . Dyspepsia   . Fatigue   . Goiter   . Hashimoto's disease   . Hashimoto's thyroiditis   . History of lower GI bleeding   . History of recurrent UTIs    Sees urologist  . History of shingles    face right   . Hx of varicella   . Hypothyroidism, acquired, autoimmune   . IBS (irritable bowel syndrome)   . Obesity   . Oligomenorrhea   . PCOS (polycystic ovarian syndrome)   . PUD (peptic ulcer disease)   . Thyroid disease     Family History  Problem Relation Age of Onset  . Breast cancer Maternal Grandmother   . Colon cancer Maternal Grandmother   . Colon polyps Maternal Grandmother   . Cancer Maternal Grandmother   . Diabetes Paternal Grandfather   . Lupus Paternal Grandmother   . Thyroid disease Paternal Grandmother     Hypothyroid without surgery or irradiation.  . Osteopenia Mother   . Obesity Mother     Social History   Social History  . Marital status: Single    Spouse name: N/A  .  Number of children: 0  . Years of education: N/A   Occupational History  . student    Social History Main Topics  . Smoking status: Never Smoker  . Smokeless tobacco: Never Used  . Alcohol use No  . Drug use: No  . Sexual activity: Yes    Birth control/ protection: Pill   Other Topics Concern  . None   Social History Narrative   [place] herself [place] [facility]. Household to 3 at home 10 hours of sleep   Negative alcohol tobacco some caffeine occasionally   Fifth year senior 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 Will be difficult to go back financially.    Outpatient Medications Prior to Visit  Medication Sig Dispense Refill  . acetaminophen (TYLENOL) 650 MG CR tablet Take 650-1,300 mg by mouth every 8 (eight) hours as needed for pain.    [name]. ALPRAZolam (XANAX XR) 0.5 MG 24 hr tablet Take 0.5 mg by mouth 2 (two) times daily as needed for anxiety.     . Calcium Carbonate-Vitamin  D (CALCIUM 500 + D PO) Take 1 tablet by mouth daily after breakfast.     . cetirizine (ZYRTEC) 10 MG tablet Take 10 mg by mouth daily.      . diclofenac sodium (VOLTAREN) 1 % GEL Apply 4 g topically 4 (four) times daily as needed. 200 g 1  . EPINEPHrine 0.3 mg/0.3 mL IJ SOAJ injection Inject 0.3 mLs (0.3 mg total) into the muscle as needed. 2 Device 0  . HYDROcodone-acetaminophen (NORCO/VICODIN) 5-325 MG tablet Take 0.25-1 tablets by mouth every 6 (six) hours as needed for moderate pain.    [name]. loperamide (IMODIUM A-D) 2 MG tablet Take as directed (Patient taking differently: Take 2 mg by mouth 2 (two) times daily as needed for diarrhea or loose stools (for diarrhea). ) 1 tablet 0  . Multiple Vitamin (MULTI-VITAMIN PO) Take 1 tablet by mouth daily after breakfast.     . Probiotic Product (PROBIOTIC PO) Take 1 capsule by mouth every morning.     . ranitidine (ZANTAC) 150 MG tablet TAKE 1 TABLET BY MOUTH 2 TIMES DAILY. (Patient taking  differently: TAKE 1 TABLET BY MOUTH EVERY MORNING) 60 tablet 6  . ondansetron (ZOFRAN-ODT) 4 MG disintegrating tablet Take 1 tablet (4 mg total) by mouth every 8 (eight) hours as needed for nausea or vomiting. (Patient not taking: Reported on 07/05/2016) 20 tablet 0  . promethazine (PHENERGAN) 12.5 MG tablet Take 12.5 mg by mouth every 6 (six) hours as needed for nausea or vomiting.    . triamcinolone cream (KENALOG) 0.1 % Apply 1 application topically 2 (two) times daily. (Patient not taking: Reported on 06/16/2016) 30 g 0  . valACYclovir (VALTREX) 1000 MG tablet TAKE 2 TABLETS BY MOUTH TWICE A DAY OR AS DIRECTED (Patient not taking: Reported on 06/16/2016) 30 tablet 2  . WYMZYA FE 0.4-35 MG-MCG tablet Chew 1 tablet by mouth daily.  7   No facility-administered medications prior to visit.      EXAM:  BP 112/70 (BP Location: Right Arm, Patient Position: Sitting, Cuff Size: Normal)   Pulse 83   Temp 98.5 F (36.9 C) (Oral)   Wt 164 lb 6.4 oz (74.6 kg)   SpO2 99%   BMI 27.36 kg/m   Body mass index is 27.36 kg/m. WDWN in NAD  quiet respirations; Moderately congested  somewhat hoarse. Non toxic . HEENT: Normocephalic ;atraumatic , Eyes;  PERRL, EOMs  Full, lids and conjunctiva clear,,Ears: no deformities, canals nl, TM landmarks normal, Nose: no deformity turbinates swollen t congested;face right  tender Mouth : OP clear without lesion or edema . Cobblestoning noted. Neck: Supple without adenopathy or masses or bruits Chest:  Clear to A&P without wheezes rales or rhonchi CV:  S1-S2 no gallops or murmurs peripheral perfusion is normal Skin :nl perfusion and no acute rashes   ASSESSMENT AND PLAN:  Discussed the following assessment and plan:  Acute sinusitis with symptoms greater than 10 days  Protracted URI Probably underlying viral illness with deterioration after 8-9 days options to treat with antibiotic nasal cortisone. Prescription sent in. Expectant management. Sinus hygiene. Risk  benefit. -Patient advised to return or notify health care team  if symptoms worsen ,persist or new concerns arise.  Patient Instructions  Treat for bacterial sinusitis   Warm compresses saline nose spray and flonase may help  Lung exam is reassuring today    Sinusitis, Adult Sinusitis is soreness and inflammation of your sinuses. Sinuses are hollow spaces in the bones around your face. Your sinuses are  located:  Around your eyes.  In the middle of your forehead.  Behind your nose.  In your cheekbones. Your sinuses and nasal passages are lined with a stringy fluid (mucus). Mucus normally drains out of your sinuses. When your nasal tissues become inflamed or swollen, the mucus can become trapped or blocked so air cannot flow through your sinuses. This allows bacteria, viruses, and funguses to grow, which leads to infection. Sinusitis can develop quickly and last for 7?10 days (acute) or for more than 12 weeks (chronic). Sinusitis often develops after a cold. What are the causes? This condition is caused by anything that creates swelling in the sinuses or stops mucus from draining, including:  Allergies.  Asthma.  Bacterial or viral infection.  Abnormally shaped bones between the nasal passages.  Nasal growths that contain mucus (nasal polyps).  Narrow sinus openings.  Pollutants, such as chemicals or irritants in the air.  A foreign object stuck in the nose.  A fungal infection. This is rare. What increases the risk? The following factors may make you more likely to develop this condition:  Having allergies or asthma.  Having had a recent cold or respiratory tract infection.  Having structural deformities or blockages in your nose or sinuses.  Having a weak immune system.  Doing a lot of swimming or diving.  Overusing nasal sprays.  Smoking. What are the signs or symptoms? The main symptoms of this condition are pain and a feeling of pressure around the  affected sinuses. Other symptoms include:  Upper toothache.  Earache.  Headache.  Bad breath.  Decreased sense of smell and taste.  A cough that may get worse at night.  Fatigue.  Fever.  Thick drainage from your nose. The drainage is often green and it may contain pus (purulent).  Stuffy nose or congestion.  Postnasal drip. This is when extra mucus collects in the throat or back of the nose.  Swelling and warmth over the affected sinuses.  Sore throat.  Sensitivity to light. How is this diagnosed? This condition is diagnosed based on symptoms, a medical history, and a physical exam. To find out if your condition is acute or chronic, your health care provider may:  Look in your nose for signs of nasal polyps.  Tap over the affected sinus to check for signs of infection.  View the inside of your sinuses using an imaging device that has a light attached (endoscope). If your health care provider suspects that you have chronic sinusitis, you may also:  Be tested for allergies.  Have a sample of mucus taken from your nose (nasal culture) and checked for bacteria.  Have a mucus sample examined to see if your sinusitis is related to an allergy. If your sinusitis does not respond to treatment and it lasts longer than 8 weeks, you may have an MRI or CT scan to check your sinuses. These scans also help to determine how severe your infection is. In rare cases, a bone biopsy may be done to rule out more serious types of fungal sinus disease. How is this treated? Treatment for sinusitis depends on the cause and whether your condition is chronic or acute. If a virus is causing your sinusitis, your symptoms will go away on their own within 10 days. You may be given medicines to relieve your symptoms, including:  Topical nasal decongestants. They shrink swollen nasal passages and let mucus drain from your sinuses.  Antihistamines. These drugs block inflammation that is triggered by  allergies.  This can help to ease swelling in your nose and sinuses.  Topical nasal corticosteroids. These are nasal sprays that ease inflammation and swelling in your nose and sinuses.  Nasal saline washes. These rinses can help to get rid of thick mucus in your nose. If your condition is caused by bacteria, you will be given an antibiotic medicine. If your condition is caused by a fungus, you will be given an antifungal medicine. Surgery may be needed to correct underlying conditions, such as narrow nasal passages. Surgery may also be needed to remove polyps. Follow these instructions at home: Medicines  Take, use, or apply over-the-counter and prescription medicines only as told by your health care provider. These may include nasal sprays.  If you were prescribed an antibiotic medicine, take it as told by your health care provider. Do not stop taking the antibiotic even if you start to feel better. Hydrate and Humidify  Drink enough water to keep your urine clear or pale yellow. Staying hydrated will help to thin your mucus.  Use a cool mist humidifier to keep the humidity level in your home above 50%.  Inhale steam for 10-15 minutes, 3-4 times a day or as told by your health care provider. You can do this in the bathroom while a hot shower is running.  Limit your exposure to cool or dry air. Rest  Rest as much as possible.  Sleep with your head raised (elevated).  Make sure to get enough sleep each night. General instructions  Apply a warm, moist washcloth to your face 3-4 times a day or as told by your health care provider. This will help with discomfort.  Wash your hands often with soap and water to reduce your exposure to viruses and other germs. If soap and water are not available, use hand sanitizer.  Do not smoke. Avoid being around people who are smoking (secondhand smoke).  Keep all follow-up visits as told by your health care provider. This is important. Contact a  health care provider if:  You have a fever.  Your symptoms get worse.  Your symptoms do not improve within 10 days. Get help right away if:  You have a severe headache.  You have persistent vomiting.  You have pain or swelling around your face or eyes.  You have vision problems.  You develop confusion.  Your neck is stiff.  You have trouble breathing. This information is not intended to replace advice given to you by your health care provider. Make sure you discuss any questions you have with your health care provider. Document Released: 08/02/2005 Document Revised: 03/28/2016 Document Reviewed: 05/28/2015 Elsevier Interactive Patient Education  2017 [employer].     [place]. Korene Dula M.D.

## 2016-07-12 DIAGNOSIS — Z113 Encounter for screening for infections with a predominantly sexual mode of transmission: Secondary | ICD-10-CM | POA: Diagnosis not present

## 2016-07-12 DIAGNOSIS — Z01419 Encounter for gynecological examination (general) (routine) without abnormal findings: Secondary | ICD-10-CM | POA: Diagnosis not present

## 2016-07-12 DIAGNOSIS — Z6827 Body mass index (BMI) 27.0-27.9, adult: Secondary | ICD-10-CM | POA: Diagnosis not present

## 2016-07-12 DIAGNOSIS — Z13228 Encounter for screening for other metabolic disorders: Secondary | ICD-10-CM | POA: Diagnosis not present

## 2016-07-19 DIAGNOSIS — F3176 Bipolar disorder, in full remission, most recent episode depressed: Secondary | ICD-10-CM | POA: Diagnosis not present

## 2016-07-19 DIAGNOSIS — F3111 Bipolar disorder, current episode manic without psychotic features, mild: Secondary | ICD-10-CM | POA: Diagnosis not present

## 2016-07-22 DIAGNOSIS — F411 Generalized anxiety disorder: Secondary | ICD-10-CM | POA: Diagnosis not present

## 2016-07-22 DIAGNOSIS — F319 Bipolar disorder, unspecified: Secondary | ICD-10-CM | POA: Diagnosis not present

## 2016-07-26 DIAGNOSIS — F319 Bipolar disorder, unspecified: Secondary | ICD-10-CM | POA: Diagnosis not present

## 2016-07-26 DIAGNOSIS — F411 Generalized anxiety disorder: Secondary | ICD-10-CM | POA: Diagnosis not present

## 2016-08-02 DIAGNOSIS — F411 Generalized anxiety disorder: Secondary | ICD-10-CM | POA: Diagnosis not present

## 2016-08-02 DIAGNOSIS — F319 Bipolar disorder, unspecified: Secondary | ICD-10-CM | POA: Diagnosis not present

## 2016-08-04 DIAGNOSIS — Z4789 Encounter for other orthopedic aftercare: Secondary | ICD-10-CM | POA: Diagnosis not present

## 2016-08-04 DIAGNOSIS — M1731 Unilateral post-traumatic osteoarthritis, right knee: Secondary | ICD-10-CM | POA: Diagnosis not present

## 2016-08-19 DIAGNOSIS — F319 Bipolar disorder, unspecified: Secondary | ICD-10-CM | POA: Diagnosis not present

## 2016-08-19 DIAGNOSIS — F411 Generalized anxiety disorder: Secondary | ICD-10-CM | POA: Diagnosis not present

## 2016-09-14 DIAGNOSIS — F411 Generalized anxiety disorder: Secondary | ICD-10-CM | POA: Diagnosis not present

## 2016-09-14 DIAGNOSIS — F319 Bipolar disorder, unspecified: Secondary | ICD-10-CM | POA: Diagnosis not present

## 2016-09-20 DIAGNOSIS — F411 Generalized anxiety disorder: Secondary | ICD-10-CM | POA: Diagnosis not present

## 2016-09-20 DIAGNOSIS — F319 Bipolar disorder, unspecified: Secondary | ICD-10-CM | POA: Diagnosis not present

## 2016-10-01 DIAGNOSIS — F319 Bipolar disorder, unspecified: Secondary | ICD-10-CM | POA: Diagnosis not present

## 2016-10-01 DIAGNOSIS — F411 Generalized anxiety disorder: Secondary | ICD-10-CM | POA: Diagnosis not present

## 2016-10-04 DIAGNOSIS — F319 Bipolar disorder, unspecified: Secondary | ICD-10-CM | POA: Diagnosis not present

## 2016-10-04 DIAGNOSIS — F411 Generalized anxiety disorder: Secondary | ICD-10-CM | POA: Diagnosis not present

## 2016-10-11 DIAGNOSIS — F319 Bipolar disorder, unspecified: Secondary | ICD-10-CM | POA: Diagnosis not present

## 2016-10-11 DIAGNOSIS — F411 Generalized anxiety disorder: Secondary | ICD-10-CM | POA: Diagnosis not present

## 2016-10-14 ENCOUNTER — Ambulatory Visit (INDEPENDENT_AMBULATORY_CARE_PROVIDER_SITE_OTHER): Payer: BLUE CROSS/BLUE SHIELD | Admitting: "Endocrinology

## 2016-10-18 DIAGNOSIS — F411 Generalized anxiety disorder: Secondary | ICD-10-CM | POA: Diagnosis not present

## 2016-10-18 DIAGNOSIS — F319 Bipolar disorder, unspecified: Secondary | ICD-10-CM | POA: Diagnosis not present

## 2016-10-27 ENCOUNTER — Ambulatory Visit (INDEPENDENT_AMBULATORY_CARE_PROVIDER_SITE_OTHER): Payer: BLUE CROSS/BLUE SHIELD | Admitting: "Endocrinology

## 2016-10-27 DIAGNOSIS — M1711 Unilateral primary osteoarthritis, right knee: Secondary | ICD-10-CM | POA: Diagnosis not present

## 2016-11-02 DIAGNOSIS — F319 Bipolar disorder, unspecified: Secondary | ICD-10-CM | POA: Diagnosis not present

## 2016-11-02 DIAGNOSIS — F411 Generalized anxiety disorder: Secondary | ICD-10-CM | POA: Diagnosis not present

## 2016-11-03 DIAGNOSIS — M1711 Unilateral primary osteoarthritis, right knee: Secondary | ICD-10-CM | POA: Diagnosis not present

## 2016-11-09 DIAGNOSIS — F411 Generalized anxiety disorder: Secondary | ICD-10-CM | POA: Diagnosis not present

## 2016-11-09 DIAGNOSIS — F319 Bipolar disorder, unspecified: Secondary | ICD-10-CM | POA: Diagnosis not present

## 2016-11-10 DIAGNOSIS — M1711 Unilateral primary osteoarthritis, right knee: Secondary | ICD-10-CM | POA: Diagnosis not present

## 2016-11-16 DIAGNOSIS — F411 Generalized anxiety disorder: Secondary | ICD-10-CM | POA: Diagnosis not present

## 2016-11-16 DIAGNOSIS — F319 Bipolar disorder, unspecified: Secondary | ICD-10-CM | POA: Diagnosis not present

## 2016-11-23 DIAGNOSIS — F411 Generalized anxiety disorder: Secondary | ICD-10-CM | POA: Diagnosis not present

## 2016-11-23 DIAGNOSIS — F3174 Bipolar disorder, in full remission, most recent episode manic: Secondary | ICD-10-CM | POA: Diagnosis not present

## 2016-11-23 DIAGNOSIS — F3176 Bipolar disorder, in full remission, most recent episode depressed: Secondary | ICD-10-CM | POA: Diagnosis not present

## 2016-11-25 DIAGNOSIS — F411 Generalized anxiety disorder: Secondary | ICD-10-CM | POA: Diagnosis not present

## 2016-11-25 DIAGNOSIS — F319 Bipolar disorder, unspecified: Secondary | ICD-10-CM | POA: Diagnosis not present

## 2016-11-30 DIAGNOSIS — F411 Generalized anxiety disorder: Secondary | ICD-10-CM | POA: Diagnosis not present

## 2016-11-30 DIAGNOSIS — F319 Bipolar disorder, unspecified: Secondary | ICD-10-CM | POA: Diagnosis not present

## 2016-12-07 DIAGNOSIS — F319 Bipolar disorder, unspecified: Secondary | ICD-10-CM | POA: Diagnosis not present

## 2016-12-07 DIAGNOSIS — F411 Generalized anxiety disorder: Secondary | ICD-10-CM | POA: Diagnosis not present

## 2016-12-14 DIAGNOSIS — E063 Autoimmune thyroiditis: Secondary | ICD-10-CM | POA: Diagnosis not present

## 2016-12-14 DIAGNOSIS — F319 Bipolar disorder, unspecified: Secondary | ICD-10-CM | POA: Diagnosis not present

## 2016-12-14 DIAGNOSIS — F411 Generalized anxiety disorder: Secondary | ICD-10-CM | POA: Diagnosis not present

## 2016-12-14 DIAGNOSIS — E559 Vitamin D deficiency, unspecified: Secondary | ICD-10-CM | POA: Diagnosis not present

## 2016-12-15 LAB — VITAMIN D 25 HYDROXY (VIT D DEFICIENCY, FRACTURES): Vit D, 25-Hydroxy: 56 ng/mL (ref 30–100)

## 2016-12-15 LAB — TSH: TSH: 1.7 m[IU]/L

## 2016-12-15 LAB — T3, FREE: T3, Free: 3.3 pg/mL (ref 2.3–4.2)

## 2016-12-15 LAB — T4, FREE: FREE T4: 1.4 ng/dL (ref 0.8–1.8)

## 2016-12-21 DIAGNOSIS — F411 Generalized anxiety disorder: Secondary | ICD-10-CM | POA: Diagnosis not present

## 2016-12-21 DIAGNOSIS — F319 Bipolar disorder, unspecified: Secondary | ICD-10-CM | POA: Diagnosis not present

## 2016-12-22 DIAGNOSIS — M1711 Unilateral primary osteoarthritis, right knee: Secondary | ICD-10-CM | POA: Diagnosis not present

## 2016-12-23 ENCOUNTER — Ambulatory Visit (INDEPENDENT_AMBULATORY_CARE_PROVIDER_SITE_OTHER): Payer: BLUE CROSS/BLUE SHIELD | Admitting: "Endocrinology

## 2016-12-23 VITALS — BP 112/60 | HR 78 | Wt 158.6 lb

## 2016-12-23 DIAGNOSIS — E559 Vitamin D deficiency, unspecified: Secondary | ICD-10-CM | POA: Diagnosis not present

## 2016-12-23 DIAGNOSIS — R1013 Epigastric pain: Secondary | ICD-10-CM | POA: Diagnosis not present

## 2016-12-23 DIAGNOSIS — E063 Autoimmune thyroiditis: Secondary | ICD-10-CM

## 2016-12-23 DIAGNOSIS — E663 Overweight: Secondary | ICD-10-CM | POA: Diagnosis not present

## 2016-12-23 DIAGNOSIS — E049 Nontoxic goiter, unspecified: Secondary | ICD-10-CM

## 2016-12-23 DIAGNOSIS — R7303 Prediabetes: Secondary | ICD-10-CM

## 2016-12-23 LAB — POCT GLUCOSE (DEVICE FOR HOME USE): POC GLUCOSE: 91 mg/dL (ref 70–99)

## 2016-12-23 LAB — POCT GLYCOSYLATED HEMOGLOBIN (HGB A1C): Hemoglobin A1C: 4.4

## 2016-12-23 NOTE — Progress Notes (Signed)
Subjective:  Patient Name: Catherine [name] Date of Birth: 07/26/1991  MRN: [id number]  Catherine [name]  presents to the office today for follow-up of her oligomenorrhea, PCOS, goiter, Hashimoto's thyroiditis, obesity, nausea and vomiting, dyspepsia, transient hypothyroidism, fatigue, anxiety and depression/bipolar disorder.   HISTORY OF PRESENT ILLNESS:   Catherine [name] is a 26 y.o. Caucasian young woman.  Catherine [name] was accompanied by her mother.  1. The patient was first referred to me on 09/09/06 by her gynecologist, Dr. Dian Queen, for evaluation and management of secondary amenorrhea. She was then 26 years old.  A. Patient had been essentially healthy up through age 20-13, when she underwent menarche. Her menstrual cycles had never been regular. Her last normal menstrual period occurred in about 2005. She was not on any oral contraceptives  or contraceptive implants at that time. Another obstetrician put her on oral contraceptives at that point. She gained about 20 pounds in weight in that first year on oral contraceptives. She started evaluation with Dr. Helane Rima in 2007 or so. Dr. Helane Rima put her on Yaz initially, but later converted her to [facility].The patient's past medical history was positive for a lot of depression, mood swings, and panic attacks. She also had 2 knee surgeries in the seventh grade. She did not do any type of routine physical activity. Family history was positive for diabetes in the paternal grandfather and hypothyroidism in the paternal grandmother.  Father had severe depression and anxiety. Maternal grandmother had colon cancer. Mother was obese. [Addendum 08/19/15: Mother had PCOS as a young woman weighting 33 pounds.] Mother also had endometriosis. As I later learned on 07/10/12, one of Cipriana's mother's first cousins had recently developed Addison's Disease. [Addendum 06/16/16: Mother suspects that several members of her family have mild bipolar disorder.]  B. On physical examination,  her height was at the 35th percentile and her weight at 177.6 pounds was at the 93rd percentile. Her BMI was 30.7. She had a 25+ gram thyroid gland. She was tender in the right lobe. She also had 1+ acanthosis nigricans. Previous laboratory data from 06/21/06 showed a normal CMP. Her TSH was 2.05 and her free T4 was 1.18. A serum glucose was 91 with a corresponding insulin level of 95, which was very excessive. Laboratory data at that visit included a blood glucose of 79 and hemoglobin A1c of 5.0%. Subsequent lab tests on 09/12/06 showed a fasting glucose of 72 and a fasting insulin of 22, again excessive. It appeared at that time that the patient was mildly obese. Her level of obesity was high enough, however, to cause a significant degree of insulin resistance and hyperinsulinemia. Her level of obesity may also have been high enough to cause oligomenorrhea. She had a goiter and clinical evidence of Hashimoto's disease, but she was euthyroid. I talked with the patient and her mother about our Eat Right Diet plan. I also talked about exercising 45-60 minutes a day. I started her on metformin, 500 mg, twice daily.  2. During the past 10 years, Catherine [name]'s weight has fluctuated up and down, varying between 150.7-179.4 lbs. She has continued to have flare ups of Hashimoto's disease. On 02/12/08 her TSH rose to 4.493, but then subsequently normalized. She has been euthyroid for most of the past 8 years. At times she's been on birth control patches, but most times has been on birth control pills. Her dyspepsia is better when she takes her ranitidine and worse when she does not. Her anxiety and depression have been major problems for her.  She was later diagnosed with bipolar disorder. She has also had chronic fatigue, recurrent UTIs, and a myriad of gastrointestinal symptoms.   3. The patient's last [facility] visit was on 06/16/16.  A. In the interim she had healthy.   B. She is taking all of her psych medications. She is  going to therapy every week.She is still Lamictal daily and Xanax as needed, but the Tegretol was discontinued. She is feeling pretty well overall on her new psych medicines.  She is beginning to be more comfortable leaving her home.    C. Her abdominal pains have resolved. She is eating mostly at home now so that she can control her diet.     D. She continues to have intermittent problems with stomach upset and diarrhea, but much less frequently.     E. She continues to take MVI daily, metformin XR, 1000 mg/day, and her calcium/vitamin D and magnesium supplements almost every day. She ran out of ranitidine several months ago.   4. Pertinent Review of Systems:  Constitutional: Catherine [name] feels "a lot better" physically and emotionally since her last visit. She is also happier.   Eyes: Vision is good. There are no significant eye complaints. Neck: The patient has occasional anterior neck swelling and tenderness, but no difficulty swallowing.  Heart: She had some faster heart rate when she was anxious. Heart rate increases with exercise or other physical activity. The patient has no other complaints of palpitations, irregular heat beats, chest pain, or chest pressure. Gastrointestinal: As above. She no longer feels hungry (belly hunger). She has no complaints of  acid reflux.  Legs: Right knee pains have improved since surgery,but the knee has never improved to normal. She no longer has leg cramps. Muscle mass and strength seem normal. There are no complaints of numbness, tingling, burning, or pain. No edema is noted. Feet: She occasionally has discoloration and discomfort of several toes. There are no obvious foot problems. There are no complaints of numbness, tingling, burning, or pain. No edema is noted. GYN: LMP was one week ago. She still takes OCPs, Balsiva (0.4 mg norethindrone/0.035 ethinyl estradiol)    PAST MEDICAL, FAMILY, AND SOCIAL HISTORY:  Past Medical History:  Diagnosis Date  . Anxiety     Counseling and psychiatry  . Chronic headaches    Sees specialist  . Dyspepsia   . Fatigue   . Goiter   . Hashimoto's disease   . Hashimoto's thyroiditis   . History of lower GI bleeding   . History of recurrent UTIs    Sees urologist  . History of shingles    face right   . Hx of varicella   . Hypothyroidism, acquired, autoimmune   . IBS (irritable bowel syndrome)   . Obesity   . Oligomenorrhea   . PCOS (polycystic ovarian syndrome)   . PUD (peptic ulcer disease)   . Thyroid disease     Family History  Problem Relation Age of Onset  . Breast cancer Maternal Grandmother   . Colon cancer Maternal Grandmother   . Colon polyps Maternal Grandmother   . Cancer Maternal Grandmother   . Diabetes Paternal Grandfather   . Lupus Paternal Grandmother   . Thyroid disease Paternal Grandmother        Hypothyroid without surgery or irradiation.  . Osteopenia Mother   . Obesity Mother      Current Outpatient Prescriptions:  .  ALPRAZolam (XANAX XR) 0.5 MG 24 hr tablet, Take 0.5 mg by mouth 2 (two)  times daily as needed for anxiety. , Disp: , Rfl:  .  Calcium Carbonate-Vitamin D (CALCIUM 500 + D PO), Take 1 tablet by mouth daily after breakfast. , Disp: , Rfl:  .  cetirizine (ZYRTEC) 10 MG tablet, Take 10 mg by mouth daily.  , Disp: , Rfl:  .  metFORMIN (GLUCOPHAGE) 500 MG tablet, Take 500 mg by mouth daily with breakfast., Disp: , Rfl:  .  Multiple Vitamin (MULTI-VITAMIN PO), Take 1 tablet by mouth daily after breakfast. , Disp: , Rfl:  .  Norethin-Eth Estradiol-Fe [facility] FE) 0.4-35 MG-MCG tablet, Chew 1 tablet by mouth daily., Disp: , Rfl:  .  Probiotic Product (PROBIOTIC PO), Take 1 capsule by mouth every morning. , Disp: , Rfl:  .  acetaminophen (TYLENOL) 650 MG CR tablet, Take 650-1,300 mg by mouth every 8 (eight) hours as needed for pain., Disp: , Rfl:  .  BALZIVA 0.4-35 MG-MCG tablet, TAKE 1 TABLET BY MOUTH EVERY DAY CONTINUOUSLY. DO NOT TAKE INACTIVE TABLETS, Disp: , Rfl:  4 .  diclofenac sodium (VOLTAREN) 1 % GEL, Apply 4 g topically 4 (four) times daily as needed. (Patient not taking: Reported on 12/23/2016), Disp: 200 g, Rfl: 1 .  doxycycline (VIBRA-TABS) 100 MG tablet, Take 1 tablet (100 mg total) by mouth 2 (two) times daily. (Patient not taking: Reported on 12/23/2016), Disp: 14 tablet, Rfl: 0 .  EPINEPHrine 0.3 mg/0.3 mL IJ SOAJ injection, Inject 0.3 mLs (0.3 mg total) into the muscle as needed. (Patient not taking: Reported on 12/23/2016), Disp: 2 Device, Rfl: 0 .  HYDROcodone-acetaminophen (NORCO/VICODIN) 5-325 MG tablet, Take 0.25-1 tablets by mouth every 6 (six) hours as needed for moderate pain., Disp: , Rfl:  .  lamoTRIgine (LAMICTAL) 150 MG tablet, Take 300 mg by mouth daily., Disp: , Rfl:  .  loperamide (IMODIUM A-D) 2 MG tablet, Take as directed (Patient not taking: Reported on 12/23/2016), Disp: 1 tablet, Rfl: 0 .  ondansetron (ZOFRAN-ODT) 4 MG disintegrating tablet, Take 1 tablet (4 mg total) by mouth every 8 (eight) hours as needed for nausea or vomiting. (Patient not taking: Reported on 07/05/2016), Disp: 20 tablet, Rfl: 0 .  promethazine (PHENERGAN) 12.5 MG tablet, Take 12.5 mg by mouth every 6 (six) hours as needed for nausea or vomiting., Disp: , Rfl:  .  ranitidine (ZANTAC) 150 MG tablet, TAKE 1 TABLET BY MOUTH 2 TIMES DAILY. (Patient not taking: Reported on 12/23/2016), Disp: 60 tablet, Rfl: 6  Allergies as of 12/23/2016 - Review Complete 12/23/2016  Allergen Reaction Noted  . Milk-related compounds Anaphylaxis 01/20/2013  . Nsaids Other (See Comments) 03/29/2011  . Onion Diarrhea and Nausea Only 01/12/2016  . Penicillins Other (See Comments) 07/24/2011    1. 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   2. Activities: She has resumed limited physical activity. 3. Smoking, alcohol, or drugs: None 4. Primary Care  Provider: Dr. Shanon Ace, MD 5. Psychiatrist: Dr. Chucky May, MD - phone [phone], fax [phone] 6. GYN: Dr. Dian Queen, MD  REVIEW OF SYSTEMS: There are no other significant problems involving Catherine [name]'s other body systems.   Objective:  Vital Signs:  BP 112/60   Pulse 78   Wt 158 lb 9.6 oz (71.9 kg)   BMI 26.39 kg/m     Ht Readings from Last 3 Encounters:  01/12/16 _0  (1.651 m)  08/20/15 _1  (  1.626 m)  05/31/14 5' 4.5" (1.638 m)   Wt Readings from Last 3 Encounters:  12/23/16 158 lb 9.6 oz (71.9 kg)  07/05/16 164 lb 6.4 oz (74.6 kg)  06/16/16 168 lb (76.2 kg)   Body surface area is 1.82 meters squared.  Facility age limit for growth percentiles is 20 years. Facility age limit for growth percentiles is 20 years.  PHYSICAL EXAM:  Constitutional: Catherine [name] is bright and upbeat today. She looks wonderful. She is positive and laughs frequently. Her affect and insight are very normal. She has lost 6 pounds in 6 months.   Face: The face appears normal.  Eyes: There is no obvious arcus or proptosis. Moisture appears normal. Mouth: The oropharynx and tongue appear normal. Dentition appears to be normal for age. Oral moisture is normal. She has no hyperpigmentation of the oral mucosa. Neck: The neck appears to be visibly normal. No carotid bruits are noted. The thyroid gland has decreased in size the normal size of 18-20 grams. The consistency of the thyroid gland is normal. The thyroid gland is  tender to palpation bilaterally again today. She has only trace acanthosis nigricans.  Lungs: The lungs are clear to auscultation. Air movement is good. Heart: Heart rate and rhythm are regular. Heart sounds S1 and S2 are normal. I did not appreciate any pathologic cardiac murmurs. Abdomen: The abdomen is much smaller. Bowel sounds are normal. There is no obvious hepatomegaly, splenomegaly, or other mass effect. She has no tenderness to palpation.   Arms: Muscle size and bulk are  normal for age. Hands: There is a trace tremor today. Phalangeal and metacarpophalangeal joints are normal. Palmar muscles are normal. Palmar skin is normal. Palmar moisture is also normal. Nails are normally pink. There is no palmar hyperpigmentation.  Legs: Muscles appear normal for age. No edema is present. Neurologic: Strength is normal for age in both the upper and lower extremities. Muscle tone is normal. Sensation to touch is normal in both legs.     LAB DATA:   Labs 12/23/16: Hemoglobin A1c 4.4%  Labs 12/14/16: TSH 1.70, free T4 1.4, free T3 3.3; 25-OH vitamin D 56  Labs 06/08/16: TSH 0.64, free T4 1.0, free T3 3.0, TPO antibody 2, anti-thyroglobulin antibody <1  Labs 12/23/15: HbA1c 4.7%  Labs 08/14/15: TSH 0.505, free T4 1.24, free T3 2.7, TPO antibody 1, thyroglobulin antibody <1; 25-OH vitamin D 44, PTH 32, calcium 9.4  Labs 04/11/15: TSH 0.910, free T4 0.89, free T3 2.9; testosterone 35; 25-OH vitamin D 38  Labs 11/27/14: HbA1c 5.2%; calcium 9.2, PTH 50, 25-OH vitamin D 34, 1,25-dihydroxy vitamin D 116;  TSH 1.094, free T4 1.15, free T3 2.9;   Labs 05/23/14: TSH 0.570, free T4 1.10, free T3 3.3; HbA1c 4.8%; calcium 9.8, PTH 36, 25-hydroxy vitamin D 56  Labs 07/09/13: 25-hydroxy Vitamin D 31; calcium 9.2, PTH 64.3;   Labs 08/30/13; TSH 0.778, free T4 1.17, free T3  3.4, TSI 43  Labs 10/23/12: TSH 0.812, free T4 1.02, free T3 2.9; iron 114, 25-hydroxy vitamin D 20, calcium 9.5, PTH 40.6  Labs 07/04/12 at 9 AM: ACTH 24, cortisol 17.3  labs 02/18/12: TSH 1.208, free T4, free T3 3.0  Bone mineral density 09/22/12: Osteopenia of the spine based upon T-score for age   Assessment and Plan:   ASSESSMENT:  1. Thyroiditis:   A. The patient's Hashimoto's disease is clinically mildly active bilaterally again today. The fact that she continues to have active thyroiditis indicates that she will probably  eventually become hypothyroid.   B. The fact that both her TSH and free T4 decreased  in parallel from April to August 2016 indicated that she had had a recent flare up of Hashimoto's thyroiditis. The shift of all 3 TFTs downward together from January to October 2015 was also pathognomonic for a flare up of Hashimoto's disease at that time.  C. Her TFTs last week were within normal limits, at about the 90% of the normal thyroid hormone range.   2. Goiter: The thyroid gland has decreased in size The process of waxing and waning of thyroid gland size shows that she has evolving Hashimoto's Thyroiditis.  3. Obesity/overweight: She has lost 6 pounds since her last visit. Now that her knee is better and she is on medication for her bipolar disorder, she will be able to exercise more.  4. Dyspepsia: This problem has improved with the combination of ranitidine and metformin, but she needs to take both medications regularly.   5. Fatigue: She is doing fairly well today, but her fatigue varies greatly with her depressive phase of BPD.  6. Bipolar disorder (BPD: It is now apparent that she has had more depressive aspects of BPD in the past. She is doing much better today.  7. Vitamin D deficiency/excess: Her 25-OH vitamin D level in December was good. I'd like her to have a higher calcium level to facilitate better calcium uptake by her bones.   PLAN:  1. Diagnostic: TFTs in 6 months. TFTs, calcium, vitamin D, and PTH prior to her next visit.   2. Therapeutic: Take one good MVI per day, either Centrum for Women or One-A-Day for Women. Continue the calcium, magnesium, and vitamin D supplements. Get an hour or more of exercise per day. Resume ranitidine, 150 mg, twice daily if her belly hunger worsens. Continue metformin 1000 mg XR once daily.  3. Patient education: We discussed thyroiditis, goiter, vitamin D deficiency, secondary hyperparathyroidism, thyroiditis, and dyspepsia. We also discussed her bipolar disorder at great length, to include how genetics and epigenetics can cause variable  expression of familial genetic traits, both physical traits and mental health traits.    4. Follow-up: 12 months  Level of Service: This visit lasted in excess of 60 minutes. More than 50% of the visit was devoted to counseling.  Tillman Sers, MD, CDE Adult and Pediatric Endocrinology 12/23/2016 2:37 PM

## 2016-12-23 NOTE — Patient Instructions (Signed)
Follow up visit in one year. Please repeat lab tests in 6 and 12 months.  

## 2016-12-24 ENCOUNTER — Encounter (INDEPENDENT_AMBULATORY_CARE_PROVIDER_SITE_OTHER): Payer: Self-pay | Admitting: "Endocrinology

## 2016-12-28 DIAGNOSIS — F319 Bipolar disorder, unspecified: Secondary | ICD-10-CM | POA: Diagnosis not present

## 2016-12-28 DIAGNOSIS — F411 Generalized anxiety disorder: Secondary | ICD-10-CM | POA: Diagnosis not present

## 2017-01-04 DIAGNOSIS — F319 Bipolar disorder, unspecified: Secondary | ICD-10-CM | POA: Diagnosis not present

## 2017-01-04 DIAGNOSIS — F411 Generalized anxiety disorder: Secondary | ICD-10-CM | POA: Diagnosis not present

## 2017-01-11 DIAGNOSIS — F411 Generalized anxiety disorder: Secondary | ICD-10-CM | POA: Diagnosis not present

## 2017-01-11 DIAGNOSIS — F319 Bipolar disorder, unspecified: Secondary | ICD-10-CM | POA: Diagnosis not present

## 2017-01-18 DIAGNOSIS — F319 Bipolar disorder, unspecified: Secondary | ICD-10-CM | POA: Diagnosis not present

## 2017-01-18 DIAGNOSIS — F411 Generalized anxiety disorder: Secondary | ICD-10-CM | POA: Diagnosis not present

## 2017-01-25 DIAGNOSIS — F411 Generalized anxiety disorder: Secondary | ICD-10-CM | POA: Diagnosis not present

## 2017-01-25 DIAGNOSIS — F319 Bipolar disorder, unspecified: Secondary | ICD-10-CM | POA: Diagnosis not present

## 2017-02-01 DIAGNOSIS — F411 Generalized anxiety disorder: Secondary | ICD-10-CM | POA: Diagnosis not present

## 2017-02-01 DIAGNOSIS — F319 Bipolar disorder, unspecified: Secondary | ICD-10-CM | POA: Diagnosis not present

## 2017-02-15 DIAGNOSIS — F411 Generalized anxiety disorder: Secondary | ICD-10-CM | POA: Diagnosis not present

## 2017-02-15 DIAGNOSIS — F319 Bipolar disorder, unspecified: Secondary | ICD-10-CM | POA: Diagnosis not present

## 2017-02-22 DIAGNOSIS — F319 Bipolar disorder, unspecified: Secondary | ICD-10-CM | POA: Diagnosis not present

## 2017-02-22 DIAGNOSIS — F411 Generalized anxiety disorder: Secondary | ICD-10-CM | POA: Diagnosis not present

## 2017-03-01 ENCOUNTER — Encounter: Payer: Self-pay | Admitting: Internal Medicine

## 2017-03-01 ENCOUNTER — Other Ambulatory Visit (HOSPITAL_COMMUNITY)
Admission: RE | Admit: 2017-03-01 | Discharge: 2017-03-01 | Disposition: A | Discharge status: Home / Self Care | Payer: BLUE CROSS/BLUE SHIELD | Source: Ambulatory Visit | Attending: Internal Medicine | Admitting: Internal Medicine

## 2017-03-01 ENCOUNTER — Ambulatory Visit (INDEPENDENT_AMBULATORY_CARE_PROVIDER_SITE_OTHER): Payer: BLUE CROSS/BLUE SHIELD | Admitting: Internal Medicine

## 2017-03-01 VITALS — BP 100/70 | HR 84 | Temp 98.5°F | Wt 150.2 lb

## 2017-03-01 DIAGNOSIS — R102 Pelvic and perineal pain: Secondary | ICD-10-CM | POA: Insufficient documentation

## 2017-03-01 DIAGNOSIS — N760 Acute vaginitis: Secondary | ICD-10-CM | POA: Insufficient documentation

## 2017-03-01 DIAGNOSIS — N898 Other specified noninflammatory disorders of vagina: Secondary | ICD-10-CM

## 2017-03-01 DIAGNOSIS — Z205 Contact with and (suspected) exposure to viral hepatitis: Secondary | ICD-10-CM

## 2017-03-01 DIAGNOSIS — Z113 Encounter for screening for infections with a predominantly sexual mode of transmission: Secondary | ICD-10-CM | POA: Diagnosis not present

## 2017-03-01 DIAGNOSIS — F411 Generalized anxiety disorder: Secondary | ICD-10-CM | POA: Diagnosis not present

## 2017-03-01 DIAGNOSIS — F319 Bipolar disorder, unspecified: Secondary | ICD-10-CM | POA: Diagnosis not present

## 2017-03-01 LAB — POC URINALSYSI DIPSTICK (AUTOMATED)
Bilirubin, UA: NEGATIVE
GLUCOSE UA: NEGATIVE
Ketones, UA: NEGATIVE
Leukocytes, UA: NEGATIVE
NITRITE UA: NEGATIVE
Protein, UA: NEGATIVE
RBC UA: NEGATIVE
Spec Grav, UA: 1.01 (ref 1.010–1.025)
UROBILINOGEN UA: 0.2 U/dL
pH, UA: 6 (ref 5.0–8.0)

## 2017-03-01 LAB — HIV ANTIBODY (ROUTINE TESTING W REFLEX): HIV: NONREACTIVE

## 2017-03-01 NOTE — Progress Notes (Signed)
Chief Complaint  Patient presents with  . Vaginal Discharge    sx started yesterday   . Vaginal Pain    HPI: Catherine [name] 26 y.o.  sda    Patient states that she has had about 48 hours of vaginal soreness burning feeling and pain this morning. With some white clumpy discharge without an odor and not a lot itching. She called her GYN office they couldn't see her for a visit but called in a medication she thinks is a pill. Didn't feel comfortable treating without an exam so she comes in today for a visit. Periods are regular no concern on her current OCP Has had 2 partners in the last month in the last 6 months usually using condoms. Would like to be checked for STI screening. Has a history of genital warts and abnormal Pap but is fine now after treatment. No serious abdominal pain her PCO S seems to be controlled.   No recnet antibiotic  1 partner has had hep c   Would l;ike to be rechecked  ROS: See pertinent positives and negatives per HPI.  Past Medical History:  Diagnosis Date  . Anxiety    Counseling and psychiatry  . Chronic headaches    Sees specialist  . Dyspepsia   . Fatigue   . Goiter   . Hashimoto's disease   . Hashimoto's thyroiditis   . History of lower GI bleeding   . History of recurrent UTIs    Sees urologist  . History of shingles    face right   . Hx of varicella   . Hypothyroidism, acquired, autoimmune   . IBS (irritable bowel syndrome)   . Obesity   . Oligomenorrhea   . PCOS (polycystic ovarian syndrome)   . PUD (peptic ulcer disease)   . Thyroid disease     Family History  Problem Relation Age of Onset  . Breast cancer Maternal Grandmother   . Colon cancer Maternal Grandmother   . Colon polyps Maternal Grandmother   . Cancer Maternal Grandmother   . Diabetes Paternal Grandfather   . Lupus Paternal Grandmother   . Thyroid disease Paternal Grandmother        Hypothyroid without surgery or irradiation.  . Osteopenia Mother   . Obesity  Mother     Social History   Social History  . Marital status: Single    Spouse name: N/A  . Number of children: 0  . Years of education: N/A   Occupational History  . student    Social History Main Topics  . Smoking status: Never Smoker  . Smokeless tobacco: Never Used  . Alcohol use No  . Drug use: No  . Sexual activity: Yes    Birth control/ protection: Pill   Other Topics Concern  . None   Social History Narrative   Lives by herself with 5 cats near [facility] apartment. 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 Will be difficult to go back financially.    Outpatient Medications Prior to Visit  Medication Sig Dispense Refill  . acetaminophen (TYLENOL) 650 MG CR tablet Take 650-1,300 mg by mouth every 8 (eight) hours as needed for pain.    [name] ALPRAZolam (XANAX XR) 0.5 MG 24 hr tablet Take 0.5 mg by mouth 2 (two) times daily as  needed for anxiety.     . Calcium Carbonate-Vitamin D (CALCIUM 500 + D PO) Take 1 tablet by mouth daily after breakfast.     . cetirizine (ZYRTEC) 10 MG tablet Take 10 mg by mouth daily.      . diclofenac sodium (VOLTAREN) 1 % GEL Apply 4 g topically 4 (four) times daily as needed. 200 g 1  . EPINEPHrine 0.3 mg/0.3 mL IJ SOAJ injection Inject 0.3 mLs (0.3 mg total) into the muscle as needed. 2 Device 0  . lamoTRIgine (LAMICTAL) 150 MG tablet Take 300 mg by mouth daily.    [name] loperamide (IMODIUM A-D) 2 MG tablet Take as directed 1 tablet 0  . metFORMIN (GLUCOPHAGE) 500 MG tablet Take 500 mg by mouth daily with breakfast.    . Multiple Vitamin (MULTI-VITAMIN PO) Take 1 tablet by mouth daily after breakfast.     . Probiotic Product (PROBIOTIC PO) Take 1 capsule by mouth every morning.     [name] doxycycline (VIBRA-TABS) 100 MG tablet Take 1 tablet (100 mg total) by mouth 2 (two) times daily. 14  tablet 0  . HYDROcodone-acetaminophen (NORCO/VICODIN) 5-325 MG tablet Take 0.25-1 tablets by mouth every 6 (six) hours as needed for moderate pain.    Kathrynn Running Estradiol-Fe [facility] FE) 0.4-35 MG-MCG tablet Chew 1 tablet by mouth daily.    . ondansetron (ZOFRAN-ODT) 4 MG disintegrating tablet Take 1 tablet (4 mg total) by mouth every 8 (eight) hours as needed for nausea or vomiting. 20 tablet 0  . promethazine (PHENERGAN) 12.5 MG tablet Take 12.5 mg by mouth every 6 (six) hours as needed for nausea or vomiting.    . ranitidine (ZANTAC) 150 MG tablet TAKE 1 TABLET BY MOUTH 2 TIMES DAILY. (Patient not taking: Reported on 03/01/2017) 60 tablet 6  . BALZIVA 0.4-35 MG-MCG tablet TAKE 1 TABLET BY MOUTH EVERY DAY CONTINUOUSLY. DO NOT TAKE INACTIVE TABLETS  4   No facility-administered medications prior to visit.      EXAM:  BP 100/70 (BP Location: Left Arm, Patient Position: Sitting, Cuff Size: Normal)   Pulse 84   Temp 98.5 F (36.9 C) (Oral)   Wt 150 lb 3.2 oz (68.1 kg)   LMP 02/14/2017 (Approximate)   BMI 24.99 kg/m   Body mass index is 24.99 kg/m.  GENERAL: vitals reviewed and listed above, alert, oriented, appears well hydrated and in no acute distress HEENT: atraumatic, conjunctiva  clear, no obvious abnormalities on inspection of external nose and ears MS: moves all extremities without noticeable focal  abnormality PSYCH: pleasant and cooperative, no obvious depression  Normal base;ine anxiety Perineal  vag exam  Ext GU min erythema no lesion seen  No adenopathy .  N1+ vag redness no fissure ulcer or wart seen  cx os clear whit dc noted   Sample taken for GC CHL trick yeast and BV   ASSESSMENT AND PLAN:  Discussed the following assessment and plan:  Vaginal pain - Plan: POCT Urinalysis Dipstick (Automated), HIV antibody, Hepatitis C antibody, RPR, Cervicovaginal ancillary only  Vaginal discharge - Plan: POCT Urinalysis Dipstick (Automated), Cervicovaginal ancillary  only  Acute vaginitis - Plan: Cervicovaginal ancillary only  Routine screening for STI (sexually transmitted infection) - Plan: HIV antibody, Hepatitis C antibody, RPR  Exposure to hepatitis C - Plan: Hepatitis C antibody Exam is reassuring some risk STI screening go ahead and treat for what sounds like these treatment and contact her GYN office about other advice for comfort. Less is best as far as  other vaginal preparations. Expectant management contact her when results are back Prevention. Total visit 25mins > 50% spent counseling and coordinating care as indicated in above note and in instructions to patient .  -Patient advised to return or notify health care team  if symptoms worsen ,persist or new concerns arise.  Patient Instructions   Your exam is reassuring today I don't see any lesions. Go ahead and take the treatment. He states her OB/GYN gave you. We'll let you know results when they are back you're doing STI screening. Avoid harsh soaps and topicals.     Neta MendsWanda K. Burnette Sautter M.D.

## 2017-03-01 NOTE — Patient Instructions (Signed)
  Your exam is reassuring today I don't see any lesions. Go ahead and take the treatment. He states her OB/GYN gave you. We'll let you know results when they are back you're doing STI screening. Avoid harsh soaps and topicals.

## 2017-03-02 LAB — CERVICOVAGINAL ANCILLARY ONLY
BACTERIAL VAGINITIS: NEGATIVE
Candida vaginitis: POSITIVE — AB
Chlamydia: NEGATIVE
Neisseria Gonorrhea: NEGATIVE
TRICH (WINDOWPATH): NEGATIVE

## 2017-03-02 LAB — HEPATITIS C ANTIBODY: HCV AB: NEGATIVE

## 2017-03-02 LAB — RPR

## 2017-03-08 DIAGNOSIS — F411 Generalized anxiety disorder: Secondary | ICD-10-CM | POA: Diagnosis not present

## 2017-03-08 DIAGNOSIS — F319 Bipolar disorder, unspecified: Secondary | ICD-10-CM | POA: Diagnosis not present

## 2017-03-17 DIAGNOSIS — F411 Generalized anxiety disorder: Secondary | ICD-10-CM | POA: Diagnosis not present

## 2017-03-17 DIAGNOSIS — F319 Bipolar disorder, unspecified: Secondary | ICD-10-CM | POA: Diagnosis not present

## 2017-03-25 DIAGNOSIS — F319 Bipolar disorder, unspecified: Secondary | ICD-10-CM | POA: Diagnosis not present

## 2017-03-25 DIAGNOSIS — F411 Generalized anxiety disorder: Secondary | ICD-10-CM | POA: Diagnosis not present

## 2017-03-29 DIAGNOSIS — F319 Bipolar disorder, unspecified: Secondary | ICD-10-CM | POA: Diagnosis not present

## 2017-03-29 DIAGNOSIS — F411 Generalized anxiety disorder: Secondary | ICD-10-CM | POA: Diagnosis not present

## 2017-04-05 DIAGNOSIS — F319 Bipolar disorder, unspecified: Secondary | ICD-10-CM | POA: Diagnosis not present

## 2017-04-05 DIAGNOSIS — F411 Generalized anxiety disorder: Secondary | ICD-10-CM | POA: Diagnosis not present

## 2017-04-12 DIAGNOSIS — F319 Bipolar disorder, unspecified: Secondary | ICD-10-CM | POA: Diagnosis not present

## 2017-04-12 DIAGNOSIS — F411 Generalized anxiety disorder: Secondary | ICD-10-CM | POA: Diagnosis not present

## 2017-04-22 DIAGNOSIS — F411 Generalized anxiety disorder: Secondary | ICD-10-CM | POA: Diagnosis not present

## 2017-04-22 DIAGNOSIS — F319 Bipolar disorder, unspecified: Secondary | ICD-10-CM | POA: Diagnosis not present

## 2017-04-29 DIAGNOSIS — F319 Bipolar disorder, unspecified: Secondary | ICD-10-CM | POA: Diagnosis not present

## 2017-04-29 DIAGNOSIS — F411 Generalized anxiety disorder: Secondary | ICD-10-CM | POA: Diagnosis not present

## 2017-05-03 DIAGNOSIS — F411 Generalized anxiety disorder: Secondary | ICD-10-CM | POA: Diagnosis not present

## 2017-05-03 DIAGNOSIS — F319 Bipolar disorder, unspecified: Secondary | ICD-10-CM | POA: Diagnosis not present

## 2017-05-13 DIAGNOSIS — F319 Bipolar disorder, unspecified: Secondary | ICD-10-CM | POA: Diagnosis not present

## 2017-05-13 DIAGNOSIS — F411 Generalized anxiety disorder: Secondary | ICD-10-CM | POA: Diagnosis not present

## 2017-05-17 DIAGNOSIS — F411 Generalized anxiety disorder: Secondary | ICD-10-CM | POA: Diagnosis not present

## 2017-05-17 DIAGNOSIS — F3174 Bipolar disorder, in full remission, most recent episode manic: Secondary | ICD-10-CM | POA: Diagnosis not present

## 2017-05-17 DIAGNOSIS — F3181 Bipolar II disorder: Secondary | ICD-10-CM | POA: Diagnosis not present

## 2017-05-19 DIAGNOSIS — F411 Generalized anxiety disorder: Secondary | ICD-10-CM | POA: Diagnosis not present

## 2017-05-19 DIAGNOSIS — F319 Bipolar disorder, unspecified: Secondary | ICD-10-CM | POA: Diagnosis not present

## 2017-05-26 DIAGNOSIS — F411 Generalized anxiety disorder: Secondary | ICD-10-CM | POA: Diagnosis not present

## 2017-05-26 DIAGNOSIS — F319 Bipolar disorder, unspecified: Secondary | ICD-10-CM | POA: Diagnosis not present

## 2017-06-03 DIAGNOSIS — F319 Bipolar disorder, unspecified: Secondary | ICD-10-CM | POA: Diagnosis not present

## 2017-06-03 DIAGNOSIS — F411 Generalized anxiety disorder: Secondary | ICD-10-CM | POA: Diagnosis not present

## 2017-06-10 DIAGNOSIS — F411 Generalized anxiety disorder: Secondary | ICD-10-CM | POA: Diagnosis not present

## 2017-06-10 DIAGNOSIS — F319 Bipolar disorder, unspecified: Secondary | ICD-10-CM | POA: Diagnosis not present

## 2017-06-20 DIAGNOSIS — F411 Generalized anxiety disorder: Secondary | ICD-10-CM | POA: Diagnosis not present

## 2017-06-20 DIAGNOSIS — F319 Bipolar disorder, unspecified: Secondary | ICD-10-CM | POA: Diagnosis not present

## 2017-06-24 DIAGNOSIS — F411 Generalized anxiety disorder: Secondary | ICD-10-CM | POA: Diagnosis not present

## 2017-06-24 DIAGNOSIS — F319 Bipolar disorder, unspecified: Secondary | ICD-10-CM | POA: Diagnosis not present

## 2017-07-04 DIAGNOSIS — F411 Generalized anxiety disorder: Secondary | ICD-10-CM | POA: Diagnosis not present

## 2017-07-04 DIAGNOSIS — F319 Bipolar disorder, unspecified: Secondary | ICD-10-CM | POA: Diagnosis not present

## 2017-07-13 DIAGNOSIS — M1711 Unilateral primary osteoarthritis, right knee: Secondary | ICD-10-CM | POA: Diagnosis not present

## 2017-07-14 ENCOUNTER — Other Ambulatory Visit: Payer: Self-pay | Admitting: Internal Medicine

## 2017-07-18 DIAGNOSIS — F411 Generalized anxiety disorder: Secondary | ICD-10-CM | POA: Diagnosis not present

## 2017-07-18 DIAGNOSIS — F319 Bipolar disorder, unspecified: Secondary | ICD-10-CM | POA: Diagnosis not present

## 2017-07-20 DIAGNOSIS — Z01419 Encounter for gynecological examination (general) (routine) without abnormal findings: Secondary | ICD-10-CM | POA: Diagnosis not present

## 2017-07-20 DIAGNOSIS — Z6827 Body mass index (BMI) 27.0-27.9, adult: Secondary | ICD-10-CM | POA: Diagnosis not present

## 2017-07-20 DIAGNOSIS — M1711 Unilateral primary osteoarthritis, right knee: Secondary | ICD-10-CM | POA: Diagnosis not present

## 2017-07-20 DIAGNOSIS — E282 Polycystic ovarian syndrome: Secondary | ICD-10-CM | POA: Diagnosis not present

## 2017-07-20 DIAGNOSIS — E559 Vitamin D deficiency, unspecified: Secondary | ICD-10-CM | POA: Diagnosis not present

## 2017-07-27 DIAGNOSIS — M1711 Unilateral primary osteoarthritis, right knee: Secondary | ICD-10-CM | POA: Diagnosis not present

## 2017-07-29 DIAGNOSIS — F319 Bipolar disorder, unspecified: Secondary | ICD-10-CM | POA: Diagnosis not present

## 2017-07-29 DIAGNOSIS — F411 Generalized anxiety disorder: Secondary | ICD-10-CM | POA: Diagnosis not present

## 2017-08-05 DIAGNOSIS — F411 Generalized anxiety disorder: Secondary | ICD-10-CM | POA: Diagnosis not present

## 2017-08-05 DIAGNOSIS — F319 Bipolar disorder, unspecified: Secondary | ICD-10-CM | POA: Diagnosis not present

## 2017-08-15 ENCOUNTER — Other Ambulatory Visit (INDEPENDENT_AMBULATORY_CARE_PROVIDER_SITE_OTHER): Payer: Self-pay | Admitting: "Endocrinology

## 2017-08-15 DIAGNOSIS — E063 Autoimmune thyroiditis: Secondary | ICD-10-CM | POA: Diagnosis not present

## 2017-08-15 DIAGNOSIS — E049 Nontoxic goiter, unspecified: Secondary | ICD-10-CM | POA: Diagnosis not present

## 2017-08-16 LAB — TSH: TSH: 0.81 (ref <=5.90)

## 2017-08-17 ENCOUNTER — Encounter (INDEPENDENT_AMBULATORY_CARE_PROVIDER_SITE_OTHER): Payer: Self-pay

## 2017-08-17 ENCOUNTER — Other Ambulatory Visit (INDEPENDENT_AMBULATORY_CARE_PROVIDER_SITE_OTHER): Payer: Self-pay | Admitting: "Endocrinology

## 2017-08-17 NOTE — Progress Notes (Signed)
Lab results from [facility] resulted on 08/16/17 Free T4= 1.29 TSH= 0.807 Free T3= 2.7

## 2017-08-17 NOTE — Progress Notes (Signed)
Error

## 2017-08-19 DIAGNOSIS — F319 Bipolar disorder, unspecified: Secondary | ICD-10-CM | POA: Diagnosis not present

## 2017-08-19 DIAGNOSIS — F411 Generalized anxiety disorder: Secondary | ICD-10-CM | POA: Diagnosis not present

## 2017-08-26 DIAGNOSIS — F411 Generalized anxiety disorder: Secondary | ICD-10-CM | POA: Diagnosis not present

## 2017-08-26 DIAGNOSIS — F319 Bipolar disorder, unspecified: Secondary | ICD-10-CM | POA: Diagnosis not present

## 2017-09-02 DIAGNOSIS — F319 Bipolar disorder, unspecified: Secondary | ICD-10-CM | POA: Diagnosis not present

## 2017-09-02 DIAGNOSIS — F411 Generalized anxiety disorder: Secondary | ICD-10-CM | POA: Diagnosis not present

## 2017-09-05 ENCOUNTER — Telehealth (INDEPENDENT_AMBULATORY_CARE_PROVIDER_SITE_OTHER): Payer: Self-pay | Admitting: "Endocrinology

## 2017-09-05 NOTE — Telephone Encounter (Signed)
Mom returned call to RN/Sara, requested a call back please.

## 2017-09-05 NOTE — Telephone Encounter (Signed)
Left Message for Catherine [name]- to call our office back- no further info given

## 2017-09-05 NOTE — Telephone Encounter (Signed)
°  Who's calling (name and relationship to patient) : Mom/Ann   Best contact number: [phone]  Provider they see: Dr Fransico MichaelBrennan   Reason for call: Mom called in requesting a call back regarding results labs that pt had drawn on 08/15/17 @ [facility].

## 2017-09-05 NOTE — Telephone Encounter (Signed)
Free T4= 1.29, TSH= 0.807 Free T3= 2.7

## 2017-09-05 NOTE — Telephone Encounter (Signed)
Reports labs were drawn on 08/15/18 at [facility]- She thinks it was TSH, T3 and T4, Gave number where they were drawn [phone]. Adv will obtain the results and once he reviews them someone will call hr back.  Call to the above number confirms those were the labs drawn but will have to call [phone] to obtain results. Call to main number- adv can see a TSH result on 08/16/17 but not the others. She will fax over the results.

## 2017-09-06 LAB — THYROXINE (T4) FREE, DIRECT
FREE T3: 2.7
Free T4: 1.29
TSH: 0.807

## 2017-09-06 NOTE — Telephone Encounter (Signed)
Call to mom Dewayne HatchAnn- advised per Dr. Fransico MichaelBrennan all thyroid labs are wnl. Mom states understanding.

## 2017-09-06 NOTE — Telephone Encounter (Signed)
Routed to provider

## 2017-09-09 DIAGNOSIS — F411 Generalized anxiety disorder: Secondary | ICD-10-CM | POA: Diagnosis not present

## 2017-09-09 DIAGNOSIS — F319 Bipolar disorder, unspecified: Secondary | ICD-10-CM | POA: Diagnosis not present

## 2017-09-16 DIAGNOSIS — F319 Bipolar disorder, unspecified: Secondary | ICD-10-CM | POA: Diagnosis not present

## 2017-09-16 DIAGNOSIS — F411 Generalized anxiety disorder: Secondary | ICD-10-CM | POA: Diagnosis not present

## 2017-09-19 LAB — T3, FREE: T3, Free: 2.7 pg/mL (ref 2.0–4.4)

## 2017-09-19 LAB — TSH: TSH: 0.807 u[IU]/mL (ref 0.450–4.500)

## 2017-09-19 LAB — T4, FREE: FREE T4: 1.29 ng/dL (ref 0.82–1.77)

## 2017-09-22 ENCOUNTER — Encounter (INDEPENDENT_AMBULATORY_CARE_PROVIDER_SITE_OTHER): Payer: Self-pay | Admitting: *Deleted

## 2017-09-26 DIAGNOSIS — F319 Bipolar disorder, unspecified: Secondary | ICD-10-CM | POA: Diagnosis not present

## 2017-09-26 DIAGNOSIS — F411 Generalized anxiety disorder: Secondary | ICD-10-CM | POA: Diagnosis not present

## 2017-10-07 DIAGNOSIS — F319 Bipolar disorder, unspecified: Secondary | ICD-10-CM | POA: Diagnosis not present

## 2017-10-07 DIAGNOSIS — F411 Generalized anxiety disorder: Secondary | ICD-10-CM | POA: Diagnosis not present

## 2017-10-14 DIAGNOSIS — F411 Generalized anxiety disorder: Secondary | ICD-10-CM | POA: Diagnosis not present

## 2017-10-14 DIAGNOSIS — F319 Bipolar disorder, unspecified: Secondary | ICD-10-CM | POA: Diagnosis not present

## 2017-10-21 DIAGNOSIS — F411 Generalized anxiety disorder: Secondary | ICD-10-CM | POA: Diagnosis not present

## 2017-10-21 DIAGNOSIS — F319 Bipolar disorder, unspecified: Secondary | ICD-10-CM | POA: Diagnosis not present

## 2017-10-28 DIAGNOSIS — F411 Generalized anxiety disorder: Secondary | ICD-10-CM | POA: Diagnosis not present

## 2017-10-28 DIAGNOSIS — F319 Bipolar disorder, unspecified: Secondary | ICD-10-CM | POA: Diagnosis not present

## 2017-11-04 DIAGNOSIS — F411 Generalized anxiety disorder: Secondary | ICD-10-CM | POA: Diagnosis not present

## 2017-11-04 DIAGNOSIS — F319 Bipolar disorder, unspecified: Secondary | ICD-10-CM | POA: Diagnosis not present

## 2017-11-07 DIAGNOSIS — F319 Bipolar disorder, unspecified: Secondary | ICD-10-CM | POA: Diagnosis not present

## 2017-11-07 DIAGNOSIS — F411 Generalized anxiety disorder: Secondary | ICD-10-CM | POA: Diagnosis not present

## 2017-11-08 DIAGNOSIS — F3176 Bipolar disorder, in full remission, most recent episode depressed: Secondary | ICD-10-CM | POA: Diagnosis not present

## 2017-11-08 DIAGNOSIS — F3174 Bipolar disorder, in full remission, most recent episode manic: Secondary | ICD-10-CM | POA: Diagnosis not present

## 2017-11-14 DIAGNOSIS — F411 Generalized anxiety disorder: Secondary | ICD-10-CM | POA: Diagnosis not present

## 2017-11-14 DIAGNOSIS — F319 Bipolar disorder, unspecified: Secondary | ICD-10-CM | POA: Diagnosis not present

## 2017-11-18 DIAGNOSIS — F411 Generalized anxiety disorder: Secondary | ICD-10-CM | POA: Diagnosis not present

## 2017-11-18 DIAGNOSIS — F319 Bipolar disorder, unspecified: Secondary | ICD-10-CM | POA: Diagnosis not present

## 2017-11-21 DIAGNOSIS — F411 Generalized anxiety disorder: Secondary | ICD-10-CM | POA: Diagnosis not present

## 2017-11-21 DIAGNOSIS — F319 Bipolar disorder, unspecified: Secondary | ICD-10-CM | POA: Diagnosis not present

## 2017-11-25 DIAGNOSIS — F411 Generalized anxiety disorder: Secondary | ICD-10-CM | POA: Diagnosis not present

## 2017-11-25 DIAGNOSIS — F319 Bipolar disorder, unspecified: Secondary | ICD-10-CM | POA: Diagnosis not present

## 2017-12-02 DIAGNOSIS — F319 Bipolar disorder, unspecified: Secondary | ICD-10-CM | POA: Diagnosis not present

## 2017-12-02 DIAGNOSIS — F411 Generalized anxiety disorder: Secondary | ICD-10-CM | POA: Diagnosis not present

## 2017-12-09 DIAGNOSIS — F411 Generalized anxiety disorder: Secondary | ICD-10-CM | POA: Diagnosis not present

## 2017-12-09 DIAGNOSIS — F319 Bipolar disorder, unspecified: Secondary | ICD-10-CM | POA: Diagnosis not present

## 2017-12-16 DIAGNOSIS — F411 Generalized anxiety disorder: Secondary | ICD-10-CM | POA: Diagnosis not present

## 2017-12-16 DIAGNOSIS — F319 Bipolar disorder, unspecified: Secondary | ICD-10-CM | POA: Diagnosis not present

## 2017-12-23 DIAGNOSIS — F319 Bipolar disorder, unspecified: Secondary | ICD-10-CM | POA: Diagnosis not present

## 2017-12-23 DIAGNOSIS — F411 Generalized anxiety disorder: Secondary | ICD-10-CM | POA: Diagnosis not present

## 2017-12-30 DIAGNOSIS — F411 Generalized anxiety disorder: Secondary | ICD-10-CM | POA: Diagnosis not present

## 2017-12-30 DIAGNOSIS — F319 Bipolar disorder, unspecified: Secondary | ICD-10-CM | POA: Diagnosis not present

## 2018-01-05 ENCOUNTER — Ambulatory Visit (INDEPENDENT_AMBULATORY_CARE_PROVIDER_SITE_OTHER): Payer: BLUE CROSS/BLUE SHIELD | Admitting: "Endocrinology

## 2018-01-06 DIAGNOSIS — F319 Bipolar disorder, unspecified: Secondary | ICD-10-CM | POA: Diagnosis not present

## 2018-01-06 DIAGNOSIS — F411 Generalized anxiety disorder: Secondary | ICD-10-CM | POA: Diagnosis not present

## 2018-01-20 DIAGNOSIS — F319 Bipolar disorder, unspecified: Secondary | ICD-10-CM | POA: Diagnosis not present

## 2018-01-20 DIAGNOSIS — F411 Generalized anxiety disorder: Secondary | ICD-10-CM | POA: Diagnosis not present

## 2018-01-25 DIAGNOSIS — M1711 Unilateral primary osteoarthritis, right knee: Secondary | ICD-10-CM | POA: Diagnosis not present

## 2018-01-27 DIAGNOSIS — F411 Generalized anxiety disorder: Secondary | ICD-10-CM | POA: Diagnosis not present

## 2018-01-27 DIAGNOSIS — F319 Bipolar disorder, unspecified: Secondary | ICD-10-CM | POA: Diagnosis not present

## 2018-02-01 DIAGNOSIS — M1711 Unilateral primary osteoarthritis, right knee: Secondary | ICD-10-CM | POA: Diagnosis not present

## 2018-02-03 DIAGNOSIS — F319 Bipolar disorder, unspecified: Secondary | ICD-10-CM | POA: Diagnosis not present

## 2018-02-03 DIAGNOSIS — F411 Generalized anxiety disorder: Secondary | ICD-10-CM | POA: Diagnosis not present

## 2018-02-08 DIAGNOSIS — M1711 Unilateral primary osteoarthritis, right knee: Secondary | ICD-10-CM | POA: Diagnosis not present

## 2018-02-10 DIAGNOSIS — F319 Bipolar disorder, unspecified: Secondary | ICD-10-CM | POA: Diagnosis not present

## 2018-02-10 DIAGNOSIS — F411 Generalized anxiety disorder: Secondary | ICD-10-CM | POA: Diagnosis not present

## 2018-02-24 DIAGNOSIS — F411 Generalized anxiety disorder: Secondary | ICD-10-CM | POA: Diagnosis not present

## 2018-02-24 DIAGNOSIS — F319 Bipolar disorder, unspecified: Secondary | ICD-10-CM | POA: Diagnosis not present

## 2018-03-01 DIAGNOSIS — L84 Corns and callosities: Secondary | ICD-10-CM | POA: Diagnosis not present

## 2018-03-03 DIAGNOSIS — F411 Generalized anxiety disorder: Secondary | ICD-10-CM | POA: Diagnosis not present

## 2018-03-03 DIAGNOSIS — F319 Bipolar disorder, unspecified: Secondary | ICD-10-CM | POA: Diagnosis not present

## 2018-03-10 DIAGNOSIS — F319 Bipolar disorder, unspecified: Secondary | ICD-10-CM | POA: Diagnosis not present

## 2018-03-10 DIAGNOSIS — F411 Generalized anxiety disorder: Secondary | ICD-10-CM | POA: Diagnosis not present

## 2018-03-17 DIAGNOSIS — F319 Bipolar disorder, unspecified: Secondary | ICD-10-CM | POA: Diagnosis not present

## 2018-03-17 DIAGNOSIS — F411 Generalized anxiety disorder: Secondary | ICD-10-CM | POA: Diagnosis not present

## 2018-03-24 DIAGNOSIS — F411 Generalized anxiety disorder: Secondary | ICD-10-CM | POA: Diagnosis not present

## 2018-03-24 DIAGNOSIS — F319 Bipolar disorder, unspecified: Secondary | ICD-10-CM | POA: Diagnosis not present

## 2018-03-31 DIAGNOSIS — F319 Bipolar disorder, unspecified: Secondary | ICD-10-CM | POA: Diagnosis not present

## 2018-03-31 DIAGNOSIS — F411 Generalized anxiety disorder: Secondary | ICD-10-CM | POA: Diagnosis not present

## 2018-04-07 DIAGNOSIS — F319 Bipolar disorder, unspecified: Secondary | ICD-10-CM | POA: Diagnosis not present

## 2018-04-07 DIAGNOSIS — F411 Generalized anxiety disorder: Secondary | ICD-10-CM | POA: Diagnosis not present

## 2018-04-14 DIAGNOSIS — F319 Bipolar disorder, unspecified: Secondary | ICD-10-CM | POA: Diagnosis not present

## 2018-04-14 DIAGNOSIS — F411 Generalized anxiety disorder: Secondary | ICD-10-CM | POA: Diagnosis not present

## 2018-04-21 DIAGNOSIS — F319 Bipolar disorder, unspecified: Secondary | ICD-10-CM | POA: Diagnosis not present

## 2018-04-21 DIAGNOSIS — F411 Generalized anxiety disorder: Secondary | ICD-10-CM | POA: Diagnosis not present

## 2018-04-24 ENCOUNTER — Ambulatory Visit: Payer: BLUE CROSS/BLUE SHIELD | Admitting: Internal Medicine

## 2018-04-24 ENCOUNTER — Encounter: Payer: Self-pay | Admitting: Internal Medicine

## 2018-04-24 VITALS — BP 98/60 | HR 68 | Temp 98.3°F | Wt 159.0 lb

## 2018-04-24 DIAGNOSIS — R309 Painful micturition, unspecified: Secondary | ICD-10-CM

## 2018-04-24 DIAGNOSIS — Z79899 Other long term (current) drug therapy: Secondary | ICD-10-CM

## 2018-04-24 DIAGNOSIS — R3989 Other symptoms and signs involving the genitourinary system: Secondary | ICD-10-CM

## 2018-04-24 LAB — POCT URINALYSIS DIPSTICK
Bilirubin, UA: NEGATIVE
GLUCOSE UA: NEGATIVE
KETONES UA: NEGATIVE
Nitrite, UA: NEGATIVE
Protein, UA: NEGATIVE
SPEC GRAV UA: 1.015 (ref 1.010–1.025)
Urobilinogen, UA: 0.2 E.U./dL
pH, UA: 6.5 (ref 5.0–8.0)

## 2018-04-24 MED ORDER — PHENAZOPYRIDINE HCL 100 MG PO TABS: 100.0000 mg | ORAL_TABLET | Freq: Three times a day (TID) | ORAL | 0 refills | Status: DC | PRN

## 2018-04-24 MED ORDER — NITROFURANTOIN MONOHYD MACRO 100 MG PO CAPS: 100.0000 mg | ORAL_CAPSULE | Freq: Two times a day (BID) | ORAL | 0 refills | Status: DC

## 2018-04-24 MED ORDER — VALACYCLOVIR HCL 1 G PO TABS: ORAL_TABLET | ORAL | 2 refills | Status: DC

## 2018-04-24 NOTE — Progress Notes (Signed)
Chief Complaint  Patient presents with  . Urinary Tract Infection    Pt c/o painfule urination, hard to empty bladder. Pt has been drinking a lot of water but no relief. Some lower abdominal and back pain. Pt noticed symptoms starting about 5 days ago. Pt has been using Pyridium (old Rx) x 3 days ( 6 doses)    HPI: Catherine [name] 27 y.o. come in for sda  " Epic uti feeling"   Awoke last 4-5 days  Some sx. Drank a lot of   Water ut then got worse  And t ook pyridium left over.  And ongoing  No fever flank pain.  Can she have a refill valtrex works well See dr Vincente Poli for  pcos and metformin ocps etc     ROS: See pertinent positives and negatives per HPI. Some post coital dysuria but resolves with fluids and voiding No recent antibiotics   Past Medical History:  Diagnosis Date  . Anxiety    Counseling and psychiatry  . Chronic headaches    Sees specialist  . Dyspepsia   . Fatigue   . Goiter   . Hashimoto's disease   . Hashimoto's thyroiditis   . History of lower GI bleeding   . History of recurrent UTIs    Sees urologist  . History of shingles    face right   . Hx of varicella   . Hypothyroidism, acquired, autoimmune   . IBS (irritable bowel syndrome)   . Obesity   . Oligomenorrhea   . PCOS (polycystic ovarian syndrome)   . PUD (peptic ulcer disease)   . Thyroid disease     Family History  Problem Relation Age of Onset  . Breast cancer Maternal Grandmother   . Colon cancer Maternal Grandmother   . Colon polyps Maternal Grandmother   . Cancer Maternal Grandmother   . Diabetes Paternal Grandfather   . Lupus Paternal Grandmother   . Thyroid disease Paternal Grandmother        Hypothyroid without surgery or irradiation.  . Osteopenia Mother   . Obesity Mother     Social History   Socioeconomic History  . Marital status: Single    Spouse name: Not on file  . Number of children: 0  . Years of education: Not on file  . Highest education level: Not on file    Occupational History  . 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 Financial resource strain: Not on file  . Food insecurity:    Worry: Not on file    Inability: Not on file  . Transportation needs:    Medical: Not on file    Non-medical: Not on file  Tobacco Use  . Smoking status: Never Smoker  . Smokeless tobacco: Never Used  Substance and Sexual Activity  . Alcohol use: No  . Drug use: No  . Sexual activity: Yes    Birth control/protection: Pill  Lifestyle  . Physical activity:    Days per week: Not on file    Minutes per session: Not on file  . Stress: Not on file  Relationships  . Social connections:    Talks on phone: Not on file    Gets together: Not on file    Attends religious service: Not on file    Active member of club or organization: Not on file    Attends meetings of clubs or organizations: Not on file    Relationship status: Not on file  Other Topics Concern  .  Not on file  Social History Narrative   Lives by herself with 5 cats near [facility] [address] apartment. 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 Will be difficult to go back financially.    Outpatient Medications Prior to Visit  Medication Sig Dispense Refill  . acetaminophen (TYLENOL) 650 MG CR tablet Take 650-1,300 mg by mouth every 8 (eight) hours as needed for pain.    [name] ALPRAZolam (XANAX XR) 0.5 MG 24 hr tablet Take 0.5 mg by mouth 2 (two) times daily as needed for anxiety.     . Calcium Carbonate-Vitamin D (CALCIUM 500 + D PO) Take 1 tablet by mouth daily after breakfast.     . cetirizine (ZYRTEC) 10 MG tablet Take 10 mg by mouth daily.      . diclofenac sodium (VOLTAREN) 1 % GEL Apply 4 g topically 4 (four) times daily as needed. 200 g 1  . EPIPEN 2-PAK 0.3 MG/0.3ML SOAJ injection INJECT 1 SYRINGE INTO THE MUSCLE AS NEEDED 1 Device 0  .  lamoTRIgine (LAMICTAL) 150 MG tablet Take 300 mg by mouth daily.    . metFORMIN (GLUCOPHAGE) 500 MG tablet Take 500 mg by mouth daily with breakfast.    . Multiple Vitamin (MULTI-VITAMIN PO) Take 1 tablet by mouth daily after breakfast.     . Norethin-Eth Estradiol-Fe (WYMZYA FE) 0.4-35 MG-MCG tablet Chew 1 tablet by mouth daily.    . Probiotic Product (PROBIOTIC PO) Take 1 capsule by mouth every morning.     . loperamide (IMODIUM A-D) 2 MG tablet Take as directed (Patient not taking: Reported on 04/24/2018) 1 tablet 0  . ranitidine (ZANTAC) 150 MG tablet TAKE 1 TABLET BY MOUTH 2 TIMES DAILY. (Patient not taking: Reported on 04/24/2018) 60 tablet 6   No facility-administered medications prior to visit.      EXAM:  BP 98/60 (BP Location: Right Arm, Patient Position: Sitting, Cuff Size: Normal)   Pulse 68   Temp 98.3 F (36.8 C) (Oral)   Wt 159 lb (72.1 kg)   SpO2 97%   BMI 26.46 kg/m   Body mass index is 26.46 kg/m. Wt Readings from Last 3 Encounters:  04/24/18 159 lb (72.1 kg)  03/01/17 150 lb 3.2 oz (68.1 kg)  12/23/16 158 lb 9.6 oz (71.9 kg)    GENERAL: vitals reviewed and listed above, alert, oriented, appears well hydrated and in no acute distress HEENT: atraumatic, conjunctiva  clear, no obvious abnormalities on inspection of external nose and ears NECK: no obvious masses on inspection palpation  Abdomen:  Sof,t normal bowel sounds without hepatosplenomegaly, no guarding rebound or masses no CVA tenderness tender over bladder area no mass MS: moves all extremities without noticeable focal  abnormality PSYCH: pleasant and cooperative, no obvious depression or anxiety  BP Readings from Last 3 Encounters:  04/24/18 98/60  03/01/17 100/70  12/23/16 112/60  ua pos blood and  Leuk   ASSESSMENT AND PLAN:  Discussed the following assessment and plan:  Suspected UTI  Painful urination - Plan: POC Urinalysis Dipstick, Urine Culture, Urine Culture  Medication management    Expectant management. -Patient advised to return or notify health care team  if  new concerns arise.  Patient Instructions   This acts like a  uti .   Urine culture pending .   Begin antibiotic  As planned .    Talk with dr Adalberto Ill about the ongoing sx.   Ok to refill valtrex.     Urinary Tract Infection, Adult A urinary tract infection (UTI) is an infection of any part of the urinary tract, which includes the kidneys, ureters, bladder, and urethra. These organs make, store, and get rid of urine in the body. UTI can be a bladder infection (cystitis) or kidney infection (pyelonephritis). What are the causes? This infection may be caused by fungi, viruses, or bacteria. Bacteria are the most common cause of UTIs. This condition can also be caused by repeated incomplete emptying of the bladder during urination. What increases the risk? This condition is more likely to develop if:  You ignore your need to urinate or hold urine for long periods of time.  You do not empty your bladder completely during urination.  You wipe back to front after urinating or having a bowel movement, if you are female.  You are uncircumcised, if you are female.  You are constipated.  You have a urinary catheter that stays in place (indwelling).  You have a weak defense (immune) system.  You have a medical condition that affects your bowels, kidneys, or bladder.  You have diabetes.  You take antibiotic medicines frequently or for long periods of time, and the antibiotics no longer work well against certain types of infections (antibiotic resistance).  You take medicines that irritate your urinary tract.  You are exposed to chemicals that irritate your urinary tract.  You are female.  What are the signs or symptoms? Symptoms of this condition include:  Fever.  Frequent urination or passing small amounts of urine frequently.  Needing to urinate urgently.  Pain or burning with  urination.  Urine that smells bad or unusual.  Cloudy urine.  Pain in the lower abdomen or back.  Trouble urinating.  Blood in the urine.  Vomiting or being less hungry than normal.  Diarrhea or abdominal pain.  Vaginal discharge, if you are female.  How is this diagnosed? This condition is diagnosed with a medical history and physical exam. You will also need to provide a urine sample to test your urine. Other tests may be done, including:  Blood tests.  Sexually transmitted disease (STD) testing.  If you have had more than one UTI, a cystoscopy or imaging studies may be done to determine the cause of the infections. How is this treated? Treatment for this condition often includes a combination of two or more of the following:  Antibiotic medicine.  Other medicines to treat less common causes of UTI.  Over-the-counter medicines to treat pain.  Drinking enough water to stay hydrated.  Follow these instructions at home:  Take over-the-counter and prescription medicines only as told by your health care provider.  If you were prescribed an antibiotic, take it as told by your health care provider. Do not stop taking the antibiotic even if you start to feel better.  Avoid alcohol, caffeine, tea, and carbonated beverages. They can irritate your bladder.  Drink enough fluid to keep your urine clear or pale yellow.  Keep all follow-up visits as told by your health care provider. This is important.  Make sure to: ? Empty your bladder often and completely. Do not hold urine for long periods of time. ? Empty your bladder before and after sex. ? Wipe from front to back after a bowel movement if you are female. Use each tissue one time when you wipe. Contact a health  care provider if:  You have back pain.  You have a fever.  You feel nauseous or vomit.  Your symptoms do not get better after 3 days.  Your symptoms go away and then return. Get help right away  if:  You have severe back pain or lower abdominal pain.  You are vomiting and cannot keep down any medicines or water. This information is not intended to replace advice given to you by your health care provider. Make sure you discuss any questions you have with your health care provider. Document Released: 05/12/2005 Document Revised: 01/14/2016 Document Reviewed: 06/23/2015 Elsevier Interactive Patient Education  2018 [employer].       Catherine [name]. Catherine [name] M.D.

## 2018-04-24 NOTE — Patient Instructions (Addendum)
This acts like a  uti .   Urine culture pending .   Begin antibiotic  As planned .    Talk with dr Adalberto Ill about the ongoing sx.   Ok to refill valtrex.     Urinary Tract Infection, Adult A urinary tract infection (UTI) is an infection of any part of the urinary tract, which includes the kidneys, ureters, bladder, and urethra. These organs make, store, and get rid of urine in the body. UTI can be a bladder infection (cystitis) or kidney infection (pyelonephritis). What are the causes? This infection may be caused by fungi, viruses, or bacteria. Bacteria are the most common cause of UTIs. This condition can also be caused by repeated incomplete emptying of the bladder during urination. What increases the risk? This condition is more likely to develop if:  You ignore your need to urinate or hold urine for long periods of time.  You do not empty your bladder completely during urination.  You wipe back to front after urinating or having a bowel movement, if you are female.  You are uncircumcised, if you are female.  You are constipated.  You have a urinary catheter that stays in place (indwelling).  You have a weak defense (immune) system.  You have a medical condition that affects your bowels, kidneys, or bladder.  You have diabetes.  You take antibiotic medicines frequently or for long periods of time, and the antibiotics no longer work well against certain types of infections (antibiotic resistance).  You take medicines that irritate your urinary tract.  You are exposed to chemicals that irritate your urinary tract.  You are female.  What are the signs or symptoms? Symptoms of this condition include:  Fever.  Frequent urination or passing small amounts of urine frequently.  Needing to urinate urgently.  Pain or burning with urination.  Urine that smells bad or unusual.  Cloudy urine.  Pain in the lower abdomen or back.  Trouble urinating.  Blood in the  urine.  Vomiting or being less hungry than normal.  Diarrhea or abdominal pain.  Vaginal discharge, if you are female.  How is this diagnosed? This condition is diagnosed with a medical history and physical exam. You will also need to provide a urine sample to test your urine. Other tests may be done, including:  Blood tests.  Sexually transmitted disease (STD) testing.  If you have had more than one UTI, a cystoscopy or imaging studies may be done to determine the cause of the infections. How is this treated? Treatment for this condition often includes a combination of two or more of the following:  Antibiotic medicine.  Other medicines to treat less common causes of UTI.  Over-the-counter medicines to treat pain.  Drinking enough water to stay hydrated.  Follow these instructions at home:  Take over-the-counter and prescription medicines only as told by your health care provider.  If you were prescribed an antibiotic, take it as told by your health care provider. Do not stop taking the antibiotic even if you start to feel better.  Avoid alcohol, caffeine, tea, and carbonated beverages. They can irritate your bladder.  Drink enough fluid to keep your urine clear or pale yellow.  Keep all follow-up visits as told by your health care provider. This is important.  Make sure to: ? Empty your bladder often and completely. Do not hold urine for long periods of time. ? Empty your bladder before and after sex. ? Wipe from front to back after  a bowel movement if you are female. Use each tissue one time when you wipe. Contact a health care provider if:  You have back pain.  You have a fever.  You feel nauseous or vomit.  Your symptoms do not get better after 3 days.  Your symptoms go away and then return. Get help right away if:  You have severe back pain or lower abdominal pain.  You are vomiting and cannot keep down any medicines or water. This information is not  intended to replace advice given to you by your health care provider. Make sure you discuss any questions you have with your health care provider. Document Released: 05/12/2005 Document Revised: 01/14/2016 Document Reviewed: 06/23/2015 Elsevier Interactive Patient Education  [employer].

## 2018-04-26 LAB — URINE CULTURE
MICRO NUMBER: 91075409
SPECIMEN QUALITY: ADEQUATE

## 2018-04-27 ENCOUNTER — Encounter: Payer: Self-pay | Admitting: *Deleted

## 2018-04-28 DIAGNOSIS — F411 Generalized anxiety disorder: Secondary | ICD-10-CM | POA: Diagnosis not present

## 2018-04-28 DIAGNOSIS — F319 Bipolar disorder, unspecified: Secondary | ICD-10-CM | POA: Diagnosis not present

## 2018-05-05 DIAGNOSIS — F319 Bipolar disorder, unspecified: Secondary | ICD-10-CM | POA: Diagnosis not present

## 2018-05-05 DIAGNOSIS — F411 Generalized anxiety disorder: Secondary | ICD-10-CM | POA: Diagnosis not present

## 2018-05-09 DIAGNOSIS — F310 Bipolar disorder, current episode hypomanic: Secondary | ICD-10-CM | POA: Diagnosis not present

## 2018-05-15 DIAGNOSIS — F319 Bipolar disorder, unspecified: Secondary | ICD-10-CM | POA: Diagnosis not present

## 2018-05-15 DIAGNOSIS — F411 Generalized anxiety disorder: Secondary | ICD-10-CM | POA: Diagnosis not present

## 2018-05-22 DIAGNOSIS — F411 Generalized anxiety disorder: Secondary | ICD-10-CM | POA: Diagnosis not present

## 2018-05-22 DIAGNOSIS — F319 Bipolar disorder, unspecified: Secondary | ICD-10-CM | POA: Diagnosis not present

## 2018-06-05 DIAGNOSIS — F411 Generalized anxiety disorder: Secondary | ICD-10-CM | POA: Diagnosis not present

## 2018-06-05 DIAGNOSIS — F319 Bipolar disorder, unspecified: Secondary | ICD-10-CM | POA: Diagnosis not present

## 2018-06-07 ENCOUNTER — Telehealth: Payer: Self-pay | Admitting: *Deleted

## 2018-06-07 NOTE — Telephone Encounter (Signed)
Copied from CRM [id number]. Topic: General - Inquiry >> Jun 06, 2018  5:12 PM Mickel Baas B, [place] wrote: Reason for CRM: Patient's mother calling and states that the patient would like the gardasil injection along with her flu shot on Monday 06/12/18. Any issues with having this done with a flu shot? CB#: [phone]

## 2018-06-07 NOTE — Telephone Encounter (Signed)
Needing Gardasil injection updated. Mother is stating the patient is 27 years old and mother is concerned that given her age it may not be covered by insurance? Read somewhere that insurance will not cover as preventative if over a certain age. No vaccine history in [facility] or Epic for this vaccine.   Safe to have with flu vaccine?   Please advise Dr Fabian Sharp, thanks.

## 2018-06-08 NOTE — Telephone Encounter (Signed)
Yes it is safe and  Can be given up to agfe 47 but I  Would not know what insurance thinks!  Can go ahead and give

## 2018-06-09 NOTE — Telephone Encounter (Signed)
Pt mother aware to speak with insurance about coverage.  Nothing further needed.

## 2018-06-12 ENCOUNTER — Ambulatory Visit (INDEPENDENT_AMBULATORY_CARE_PROVIDER_SITE_OTHER): Payer: BLUE CROSS/BLUE SHIELD

## 2018-06-12 DIAGNOSIS — F319 Bipolar disorder, unspecified: Secondary | ICD-10-CM | POA: Diagnosis not present

## 2018-06-12 DIAGNOSIS — Z23 Encounter for immunization: Secondary | ICD-10-CM | POA: Diagnosis not present

## 2018-06-12 DIAGNOSIS — F411 Generalized anxiety disorder: Secondary | ICD-10-CM | POA: Diagnosis not present

## 2018-06-13 DIAGNOSIS — Z23 Encounter for immunization: Secondary | ICD-10-CM | POA: Diagnosis not present

## 2018-06-13 NOTE — Addendum Note (Signed)
Addended by: Waymon Amato R on: 06/13/2018 08:57 AM   Modules accepted: Orders

## 2018-06-19 DIAGNOSIS — F319 Bipolar disorder, unspecified: Secondary | ICD-10-CM | POA: Diagnosis not present

## 2018-06-19 DIAGNOSIS — F411 Generalized anxiety disorder: Secondary | ICD-10-CM | POA: Diagnosis not present

## 2018-06-20 ENCOUNTER — Encounter: Payer: Self-pay | Admitting: Family Medicine

## 2018-06-20 ENCOUNTER — Ambulatory Visit (INDEPENDENT_AMBULATORY_CARE_PROVIDER_SITE_OTHER): Payer: BLUE CROSS/BLUE SHIELD

## 2018-06-20 ENCOUNTER — Ambulatory Visit: Payer: BLUE CROSS/BLUE SHIELD | Admitting: Family Medicine

## 2018-06-20 VITALS — BP 104/78 | HR 100 | Temp 98.3°F | Ht 65.0 in | Wt 145.0 lb

## 2018-06-20 DIAGNOSIS — R05 Cough: Secondary | ICD-10-CM

## 2018-06-20 DIAGNOSIS — J3089 Other allergic rhinitis: Secondary | ICD-10-CM | POA: Diagnosis not present

## 2018-06-20 DIAGNOSIS — R0982 Postnasal drip: Secondary | ICD-10-CM | POA: Diagnosis not present

## 2018-06-20 DIAGNOSIS — R059 Cough, unspecified: Secondary | ICD-10-CM

## 2018-06-20 DIAGNOSIS — R079 Chest pain, unspecified: Secondary | ICD-10-CM | POA: Diagnosis not present

## 2018-06-20 NOTE — Patient Instructions (Signed)
BEFORE YOU LEAVE: -CXR  Start flonase 2 sprays each nostril daily for 1 month. Continue your allergy pill.  Get the chest xray.  I hope you are feeling better soon! Seek care promptly if your symptoms worsen, new concerns arise or you are not improving with treatment.

## 2018-06-20 NOTE — Progress Notes (Signed)
HPI:  Using dictation device. Unfortunately this device frequently misinterprets words/phrases.   Acute visit for respiratory illness: -started: 3-4 weeks ago with a cold -symptoms:nasal congestion, sore throat, cough initially; now intermittent cough -denies:fever, SOB, NVD, tooth pain -has tried: takes alelrgy pill -sick contacts/travel/risks: no reported flu, strep or tick exposure - but around niece whom does get sick often -Hx of: allergies, lives with 4 coughs -FDLMP: see nursing notes, regular ROS: See pertinent positives and negatives per HPI.  Past Medical History:  Diagnosis Date  . Anxiety    Counseling and psychiatry  . Chronic headaches    Sees specialist  . Dyspepsia   . Fatigue   . Goiter   . Hashimoto's disease   . Hashimoto's thyroiditis   . History of lower GI bleeding   . History of recurrent UTIs    Sees urologist  . History of shingles    face right   . Hx of varicella   . Hypothyroidism, acquired, autoimmune   . IBS (irritable bowel syndrome)   . Obesity   . Oligomenorrhea   . PCOS (polycystic ovarian syndrome)   . PUD (peptic ulcer disease)   . Thyroid disease     Past Surgical History:  Procedure Laterality Date  . BAND HEMORRHOIDECTOMY    . KNEE SURGERY     x 2 femoral.  break  sp fall  7th grade   . knees    . WISDOM TOOTH EXTRACTION      Family History  Problem Relation Age of Onset  . Breast cancer Maternal Grandmother   . Colon cancer Maternal Grandmother   . Colon polyps Maternal Grandmother   . Cancer Maternal Grandmother   . Diabetes Paternal Grandfather   . Lupus Paternal Grandmother   . Thyroid disease Paternal Grandmother        Hypothyroid without surgery or irradiation.  . Osteopenia Mother   . Obesity Mother     Social History   Socioeconomic History  . Marital status: Single    Spouse name: Not on file  . Number of children: 0  . Years of education: Not on file  . Highest education level: Not on file   Occupational History  . 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 Financial resource strain: Not on file  . Food insecurity:    Worry: Not on file    Inability: Not on file  . Transportation needs:    Medical: Not on file    Non-medical: Not on file  Tobacco Use  . Smoking status: Never Smoker  . Smokeless tobacco: Never Used  Substance and Sexual Activity  . Alcohol use: No  . Drug use: No  . Sexual activity: Yes    Birth control/protection: Pill  Lifestyle  . Physical activity:    Days per week: Not on file    Minutes per session: Not on file  . Stress: Not on file  Relationships  . Social connections:    Talks on phone: Not on file    Gets together: Not on file    Attends religious service: Not on file    Active member of club or organization: Not on file    Attends meetings of clubs or organizations: Not on file    Relationship status: Not on file  Other Topics Concern  . Not on file  Social History Narrative   Lives by herself with 5 cats near [facility] [address] apartment. 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 Will be difficult to go back financially.     Current Outpatient Medications:  .  acetaminophen (TYLENOL) 650 MG CR tablet, Take 650-1,300 mg by mouth every 8 (eight) hours as needed for pain., Disp: , Rfl:  .  ALPRAZolam (XANAX XR) 0.5 MG 24 hr tablet, Take 0.5 mg by mouth 2 (two) times daily as needed for anxiety. , Disp: , Rfl:  .  Calcium Carbonate-Vitamin D (CALCIUM 500 + D PO), Take 1 tablet by mouth daily after breakfast. , Disp: , Rfl:  .  cetirizine (ZYRTEC) 10 MG tablet, Take 10 mg by mouth daily.  , Disp: , Rfl:  .  diclofenac sodium (VOLTAREN) 1 % GEL, Apply 4 g topically 4 (four) times daily as needed., Disp: 200 g, Rfl: 1 .  EPIPEN 2-PAK 0.3 MG/0.3ML SOAJ injection, INJECT 1 SYRINGE INTO THE  MUSCLE AS NEEDED, Disp: 1 Device, Rfl: 0 .  lamoTRIgine (LAMICTAL) 150 MG tablet, Take 300 mg by mouth daily., Disp: , Rfl:  .  metFORMIN (GLUCOPHAGE) 500 MG tablet, Take 500 mg by mouth daily with breakfast., Disp: , Rfl:  .  Multiple Vitamin (MULTI-VITAMIN PO), Take 1 tablet by mouth daily after breakfast. , Disp: , Rfl:  .  Norethin-Eth Estradiol-Fe [facility] FE) 0.4-35 MG-MCG tablet, Chew 1 tablet by mouth daily., Disp: , Rfl:  .  Probiotic Product (PROBIOTIC PO), Take 1 capsule by mouth every morning. , Disp: , Rfl:  .  valACYclovir (VALTREX) 1000 MG tablet, TAKE 2 TABLETS BY MOUTH TWICE A DAY OR AS DIRECTED, Disp: 30 tablet, Rfl: 2  EXAM:  Vitals:   06/20/18 1035  BP: 104/78  Pulse: 100  Temp: 98.3 F (36.8 C)  SpO2: 98%    Body mass index is 24.13 kg/m.  GENERAL: vitals reviewed and listed above, alert, oriented, appears well hydrated and in no acute distress  HEENT: atraumatic, conjunttiva clear, no obvious abnormalities on inspection of external nose and ears, normal appearance of ear canals and TMs, clear nasal congestion, mild post oropharyngeal erythema with PND, no tonsillar edema or exudate, no sinus TTP  NECK: no obvious masses on inspection  LUNGS: clear to auscultation bilaterally, no wheezes, rales or rhonchi, good air movement  CV: HRRR, no peripheral edema  MS: moves all extremities without noticeable abnormality  PSYCH: pleasant and cooperative, no obvious depression or anxiety  ASSESSMENT AND PLAN:  Discussed the following assessment and plan:  Cough - Plan: DG Chest 2 View  PND (post-nasal drip)  Non-seasonal allergic rhinitis, unspecified trigger  -given HPI and exam findings today, a serious infection or illness is unlikely. We discussed potential etiologies, with VURI with post viral cough or AR with PND being most likely. We discussed treatment side effects, likely course, antibiotic misuse, transmission, and signs of developing a serious  illness. Add INS. -CXR to exclude other and treat accordingly. -of course, we advised to return or notify a doctor immediately if symptoms worsen or persist or new concerns arise.    Patient Instructions  BEFORE YOU LEAVE: -CXR  Start flonase 2 sprays each nostril daily for 1 month. Continue your allergy pill.  Get the chest xray.  I hope you are feeling better soon! Seek care promptly if your symptoms worsen, new concerns arise or you are not improving with treatment.      Damita Lack  Maudie Mercury, DO

## 2018-06-26 DIAGNOSIS — F411 Generalized anxiety disorder: Secondary | ICD-10-CM | POA: Diagnosis not present

## 2018-06-26 DIAGNOSIS — F319 Bipolar disorder, unspecified: Secondary | ICD-10-CM | POA: Diagnosis not present

## 2018-07-03 DIAGNOSIS — F411 Generalized anxiety disorder: Secondary | ICD-10-CM | POA: Diagnosis not present

## 2018-07-03 DIAGNOSIS — F319 Bipolar disorder, unspecified: Secondary | ICD-10-CM | POA: Diagnosis not present

## 2018-07-10 DIAGNOSIS — F319 Bipolar disorder, unspecified: Secondary | ICD-10-CM | POA: Diagnosis not present

## 2018-07-10 DIAGNOSIS — F411 Generalized anxiety disorder: Secondary | ICD-10-CM | POA: Diagnosis not present

## 2018-07-17 DIAGNOSIS — F411 Generalized anxiety disorder: Secondary | ICD-10-CM | POA: Diagnosis not present

## 2018-07-17 DIAGNOSIS — F319 Bipolar disorder, unspecified: Secondary | ICD-10-CM | POA: Diagnosis not present

## 2018-07-24 DIAGNOSIS — F411 Generalized anxiety disorder: Secondary | ICD-10-CM | POA: Diagnosis not present

## 2018-07-24 DIAGNOSIS — F319 Bipolar disorder, unspecified: Secondary | ICD-10-CM | POA: Diagnosis not present

## 2018-07-27 DIAGNOSIS — Z6825 Body mass index (BMI) 25.0-25.9, adult: Secondary | ICD-10-CM | POA: Diagnosis not present

## 2018-07-27 DIAGNOSIS — Z01419 Encounter for gynecological examination (general) (routine) without abnormal findings: Secondary | ICD-10-CM | POA: Diagnosis not present

## 2018-07-31 DIAGNOSIS — F411 Generalized anxiety disorder: Secondary | ICD-10-CM | POA: Diagnosis not present

## 2018-07-31 DIAGNOSIS — F319 Bipolar disorder, unspecified: Secondary | ICD-10-CM | POA: Diagnosis not present

## 2018-08-07 DIAGNOSIS — F319 Bipolar disorder, unspecified: Secondary | ICD-10-CM | POA: Diagnosis not present

## 2018-08-07 DIAGNOSIS — F411 Generalized anxiety disorder: Secondary | ICD-10-CM | POA: Diagnosis not present

## 2018-08-14 ENCOUNTER — Ambulatory Visit (INDEPENDENT_AMBULATORY_CARE_PROVIDER_SITE_OTHER): Payer: BLUE CROSS/BLUE SHIELD | Admitting: *Deleted

## 2018-08-14 DIAGNOSIS — F319 Bipolar disorder, unspecified: Secondary | ICD-10-CM | POA: Diagnosis not present

## 2018-08-14 DIAGNOSIS — Z23 Encounter for immunization: Secondary | ICD-10-CM | POA: Diagnosis not present

## 2018-08-14 DIAGNOSIS — F411 Generalized anxiety disorder: Secondary | ICD-10-CM | POA: Diagnosis not present

## 2018-08-21 DIAGNOSIS — F411 Generalized anxiety disorder: Secondary | ICD-10-CM | POA: Diagnosis not present

## 2018-08-21 DIAGNOSIS — F319 Bipolar disorder, unspecified: Secondary | ICD-10-CM | POA: Diagnosis not present

## 2018-08-28 DIAGNOSIS — F319 Bipolar disorder, unspecified: Secondary | ICD-10-CM | POA: Diagnosis not present

## 2018-08-28 DIAGNOSIS — F411 Generalized anxiety disorder: Secondary | ICD-10-CM | POA: Diagnosis not present

## 2018-09-04 DIAGNOSIS — F411 Generalized anxiety disorder: Secondary | ICD-10-CM | POA: Diagnosis not present

## 2018-09-04 DIAGNOSIS — F319 Bipolar disorder, unspecified: Secondary | ICD-10-CM | POA: Diagnosis not present

## 2018-09-11 DIAGNOSIS — F319 Bipolar disorder, unspecified: Secondary | ICD-10-CM | POA: Diagnosis not present

## 2018-09-11 DIAGNOSIS — F411 Generalized anxiety disorder: Secondary | ICD-10-CM | POA: Diagnosis not present

## 2018-09-18 DIAGNOSIS — F319 Bipolar disorder, unspecified: Secondary | ICD-10-CM | POA: Diagnosis not present

## 2018-09-18 DIAGNOSIS — F411 Generalized anxiety disorder: Secondary | ICD-10-CM | POA: Diagnosis not present

## 2018-09-25 DIAGNOSIS — F411 Generalized anxiety disorder: Secondary | ICD-10-CM | POA: Diagnosis not present

## 2018-09-25 DIAGNOSIS — F319 Bipolar disorder, unspecified: Secondary | ICD-10-CM | POA: Diagnosis not present

## 2018-10-02 DIAGNOSIS — F411 Generalized anxiety disorder: Secondary | ICD-10-CM | POA: Diagnosis not present

## 2018-10-02 DIAGNOSIS — F319 Bipolar disorder, unspecified: Secondary | ICD-10-CM | POA: Diagnosis not present

## 2018-10-04 DIAGNOSIS — M1711 Unilateral primary osteoarthritis, right knee: Secondary | ICD-10-CM | POA: Diagnosis not present

## 2018-10-09 DIAGNOSIS — F319 Bipolar disorder, unspecified: Secondary | ICD-10-CM | POA: Diagnosis not present

## 2018-10-09 DIAGNOSIS — F411 Generalized anxiety disorder: Secondary | ICD-10-CM | POA: Diagnosis not present

## 2018-10-11 DIAGNOSIS — M1711 Unilateral primary osteoarthritis, right knee: Secondary | ICD-10-CM | POA: Diagnosis not present

## 2018-10-16 DIAGNOSIS — F319 Bipolar disorder, unspecified: Secondary | ICD-10-CM | POA: Diagnosis not present

## 2018-10-16 DIAGNOSIS — F411 Generalized anxiety disorder: Secondary | ICD-10-CM | POA: Diagnosis not present

## 2018-10-17 DIAGNOSIS — M1711 Unilateral primary osteoarthritis, right knee: Secondary | ICD-10-CM | POA: Diagnosis not present

## 2018-10-23 DIAGNOSIS — F319 Bipolar disorder, unspecified: Secondary | ICD-10-CM | POA: Diagnosis not present

## 2018-10-23 DIAGNOSIS — F411 Generalized anxiety disorder: Secondary | ICD-10-CM | POA: Diagnosis not present

## 2018-10-30 DIAGNOSIS — F411 Generalized anxiety disorder: Secondary | ICD-10-CM | POA: Diagnosis not present

## 2018-10-30 DIAGNOSIS — F319 Bipolar disorder, unspecified: Secondary | ICD-10-CM | POA: Diagnosis not present

## 2018-10-31 DIAGNOSIS — F410 Panic disorder [episodic paroxysmal anxiety] without agoraphobia: Secondary | ICD-10-CM | POA: Diagnosis not present

## 2018-10-31 DIAGNOSIS — F3181 Bipolar II disorder: Secondary | ICD-10-CM | POA: Diagnosis not present

## 2018-11-06 DIAGNOSIS — F411 Generalized anxiety disorder: Secondary | ICD-10-CM | POA: Diagnosis not present

## 2018-11-06 DIAGNOSIS — F319 Bipolar disorder, unspecified: Secondary | ICD-10-CM | POA: Diagnosis not present

## 2018-11-13 ENCOUNTER — Ambulatory Visit: Payer: BLUE CROSS/BLUE SHIELD

## 2018-11-13 DIAGNOSIS — F411 Generalized anxiety disorder: Secondary | ICD-10-CM | POA: Diagnosis not present

## 2018-11-13 DIAGNOSIS — F319 Bipolar disorder, unspecified: Secondary | ICD-10-CM | POA: Diagnosis not present

## 2018-11-20 DIAGNOSIS — F319 Bipolar disorder, unspecified: Secondary | ICD-10-CM | POA: Diagnosis not present

## 2018-11-20 DIAGNOSIS — F411 Generalized anxiety disorder: Secondary | ICD-10-CM | POA: Diagnosis not present

## 2018-11-27 DIAGNOSIS — F319 Bipolar disorder, unspecified: Secondary | ICD-10-CM | POA: Diagnosis not present

## 2018-11-27 DIAGNOSIS — F411 Generalized anxiety disorder: Secondary | ICD-10-CM | POA: Diagnosis not present

## 2018-12-04 DIAGNOSIS — F319 Bipolar disorder, unspecified: Secondary | ICD-10-CM | POA: Diagnosis not present

## 2018-12-04 DIAGNOSIS — F411 Generalized anxiety disorder: Secondary | ICD-10-CM | POA: Diagnosis not present

## 2018-12-11 DIAGNOSIS — F411 Generalized anxiety disorder: Secondary | ICD-10-CM | POA: Diagnosis not present

## 2018-12-11 DIAGNOSIS — F319 Bipolar disorder, unspecified: Secondary | ICD-10-CM | POA: Diagnosis not present

## 2018-12-18 DIAGNOSIS — F411 Generalized anxiety disorder: Secondary | ICD-10-CM | POA: Diagnosis not present

## 2018-12-18 DIAGNOSIS — F319 Bipolar disorder, unspecified: Secondary | ICD-10-CM | POA: Diagnosis not present

## 2018-12-25 DIAGNOSIS — F411 Generalized anxiety disorder: Secondary | ICD-10-CM | POA: Diagnosis not present

## 2018-12-25 DIAGNOSIS — F319 Bipolar disorder, unspecified: Secondary | ICD-10-CM | POA: Diagnosis not present

## 2019-01-01 DIAGNOSIS — F319 Bipolar disorder, unspecified: Secondary | ICD-10-CM | POA: Diagnosis not present

## 2019-01-01 DIAGNOSIS — F411 Generalized anxiety disorder: Secondary | ICD-10-CM | POA: Diagnosis not present

## 2019-01-08 DIAGNOSIS — F411 Generalized anxiety disorder: Secondary | ICD-10-CM | POA: Diagnosis not present

## 2019-01-08 DIAGNOSIS — F319 Bipolar disorder, unspecified: Secondary | ICD-10-CM | POA: Diagnosis not present

## 2019-01-15 DIAGNOSIS — F319 Bipolar disorder, unspecified: Secondary | ICD-10-CM | POA: Diagnosis not present

## 2019-01-15 DIAGNOSIS — F411 Generalized anxiety disorder: Secondary | ICD-10-CM | POA: Diagnosis not present

## 2019-01-22 DIAGNOSIS — F411 Generalized anxiety disorder: Secondary | ICD-10-CM | POA: Diagnosis not present

## 2019-01-22 DIAGNOSIS — F319 Bipolar disorder, unspecified: Secondary | ICD-10-CM | POA: Diagnosis not present

## 2019-01-29 DIAGNOSIS — F319 Bipolar disorder, unspecified: Secondary | ICD-10-CM | POA: Diagnosis not present

## 2019-01-29 DIAGNOSIS — F411 Generalized anxiety disorder: Secondary | ICD-10-CM | POA: Diagnosis not present

## 2019-02-05 DIAGNOSIS — F319 Bipolar disorder, unspecified: Secondary | ICD-10-CM | POA: Diagnosis not present

## 2019-02-05 DIAGNOSIS — F411 Generalized anxiety disorder: Secondary | ICD-10-CM | POA: Diagnosis not present

## 2019-02-12 DIAGNOSIS — F319 Bipolar disorder, unspecified: Secondary | ICD-10-CM | POA: Diagnosis not present

## 2019-02-12 DIAGNOSIS — F411 Generalized anxiety disorder: Secondary | ICD-10-CM | POA: Diagnosis not present

## 2019-02-19 DIAGNOSIS — F411 Generalized anxiety disorder: Secondary | ICD-10-CM | POA: Diagnosis not present

## 2019-02-19 DIAGNOSIS — F319 Bipolar disorder, unspecified: Secondary | ICD-10-CM | POA: Diagnosis not present

## 2019-02-26 DIAGNOSIS — F411 Generalized anxiety disorder: Secondary | ICD-10-CM | POA: Diagnosis not present

## 2019-02-26 DIAGNOSIS — F319 Bipolar disorder, unspecified: Secondary | ICD-10-CM | POA: Diagnosis not present

## 2019-03-05 DIAGNOSIS — F319 Bipolar disorder, unspecified: Secondary | ICD-10-CM | POA: Diagnosis not present

## 2019-03-05 DIAGNOSIS — F411 Generalized anxiety disorder: Secondary | ICD-10-CM | POA: Diagnosis not present

## 2019-03-12 DIAGNOSIS — F319 Bipolar disorder, unspecified: Secondary | ICD-10-CM | POA: Diagnosis not present

## 2019-03-12 DIAGNOSIS — F411 Generalized anxiety disorder: Secondary | ICD-10-CM | POA: Diagnosis not present

## 2019-03-15 DIAGNOSIS — F319 Bipolar disorder, unspecified: Secondary | ICD-10-CM | POA: Diagnosis not present

## 2019-03-15 DIAGNOSIS — F411 Generalized anxiety disorder: Secondary | ICD-10-CM | POA: Diagnosis not present

## 2019-03-19 DIAGNOSIS — F411 Generalized anxiety disorder: Secondary | ICD-10-CM | POA: Diagnosis not present

## 2019-03-19 DIAGNOSIS — F319 Bipolar disorder, unspecified: Secondary | ICD-10-CM | POA: Diagnosis not present

## 2019-03-22 DIAGNOSIS — F411 Generalized anxiety disorder: Secondary | ICD-10-CM | POA: Diagnosis not present

## 2019-03-22 DIAGNOSIS — F319 Bipolar disorder, unspecified: Secondary | ICD-10-CM | POA: Diagnosis not present

## 2019-03-27 NOTE — Progress Notes (Signed)
Chief Complaint  Patient presents with  . Headache    [facility] for her migraines she get with menstrual cycle     HPI: Catherine MorinKristen P [name] 28 y.o. come in for 3rd gardisil  And referral for her migraine management  Since recently becoming more  Frequent     Migraines since a teen  And now week  Menstrual migarins   But   More frequent  Had used other meds not that helpgul    Remote  Hx of neurology care    Would like referral to either .   [facility] and [facility]. .    For help with her headaches  . In past certain meds either se or not working    Had s/e of topomax   Had gout and kidney stones   .   Although helped otherwise    Ask for refill epi pen  Allergy to milk products  gets  Chest tightness and  Vomiting  And  Rash  Runny nose . And throat.     Tight never had to use the epipen but hers is expired .       pcos stable  Psych ok on meds dr Evelene CroonKaur bipolar   ROS: See pertinent positives and negatives per HPI.  Past Medical History:  Diagnosis Date  . Anxiety    Counseling and psychiatry  . Chronic headaches    Sees specialist  . Dyspepsia   . Fatigue   . Goiter   . Hashimoto's disease   . Hashimoto's thyroiditis   . History of lower GI bleeding   . History of recurrent UTIs    Sees urologist  . History of shingles    face right   . Hx of varicella   . Hypothyroidism, acquired, autoimmune   . IBS (irritable bowel syndrome)   . Obesity   . Oligomenorrhea   . PCOS (polycystic ovarian syndrome)   . PUD (peptic ulcer disease)   . Thyroid disease     Family History  Problem Relation Age of Onset  . Breast cancer Maternal Grandmother   . Colon cancer Maternal Grandmother   . Colon polyps Maternal Grandmother   . Cancer Maternal Grandmother   . Diabetes Paternal Grandfather   . Lupus Paternal Grandmother   . Thyroid disease Paternal Grandmother        Hypothyroid without surgery or irradiation.  . Osteopenia Mother   . Obesity  Mother     Social History   Socioeconomic History  . Marital status: Single    Spouse name: Not on file  . Number of children: 0  . Years of education: Not on file  . Highest education level: Not on file  Occupational History  . 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 Financial resource strain: Not on file  . Food insecurity    Worry: Not on file    Inability: Not on file  . Transportation needs    Medical: Not on file    Non-medical: Not on file  Tobacco Use  . Smoking status: Never Smoker  . Smokeless tobacco: Never Used  Substance and Sexual Activity  . Alcohol use: No  . Drug use: No  . Sexual activity: Yes    Birth control/protection: Pill  Lifestyle  . Physical activity    Days per week: Not on file    Minutes per session: Not on file  . Stress: Not on file  Relationships  .  Social Herbalist on phone: Not on file    Gets together: Not on file    Attends religious service: Not on file    Active member of club or organization: Not on file    Attends meetings of clubs or organizations: Not on file    Relationship status: Not on file  Other Topics Concern  . Not on file  Social History Narrative   Lives by herself with 5 cats near [place]. apartment. 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 Will be difficult to go back financially.    Outpatient Medications Prior to Visit  Medication Sig Dispense Refill  . acetaminophen (TYLENOL) 650 MG CR tablet Take 650-1,300 mg by mouth every 8 (eight) hours as needed for pain.    [name] ALPRAZolam (XANAX XR) 0.5 MG 24 hr tablet Take 0.5 mg by mouth 2 (two) times daily as needed for anxiety.     . Calcium Carbonate-Vitamin D (CALCIUM 500 + D PO) Take 1 tablet by mouth daily after breakfast.     . cetirizine (ZYRTEC) 10 MG tablet Take 10 mg by mouth daily.       . diclofenac sodium (VOLTAREN) 1 % GEL Apply 4 g topically 4 (four) times daily as needed. 200 g 1  . lamoTRIgine (LAMICTAL) 150 MG tablet Take 300 mg by mouth daily.    . metFORMIN (GLUCOPHAGE) 500 MG tablet Take 500 mg by mouth daily with breakfast.    . Multiple Vitamin (MULTI-VITAMIN PO) Take 1 tablet by mouth daily after breakfast.     . Norethin-Eth Estradiol-Fe (WYMZYA FE) 0.4-35 MG-MCG tablet Chew 1 tablet by mouth daily.    . Probiotic Product (PROBIOTIC PO) Take 1 capsule by mouth every morning.     [name] EPIPEN 2-PAK 0.3 MG/0.3ML SOAJ injection INJECT 1 SYRINGE INTO THE MUSCLE AS NEEDED 1 Device 0  . valACYclovir (VALTREX) 1000 MG tablet TAKE 2 TABLETS BY MOUTH TWICE A DAY OR AS DIRECTED 30 tablet 2   No facility-administered medications prior to visit.      EXAM:  BP 112/62 (BP Location: Right Arm, Patient Position: Sitting, Cuff Size: Normal)   Pulse 96   Temp (!) 97.2 F (36.2 C) (Temporal)   Wt 164 lb 6.4 oz (74.6 kg)   SpO2 99%   BMI 27.36 kg/m   Body mass index is 27.36 kg/m.  GENERAL: vitals reviewed and listed above, alert, oriented, appears well hydrated and in no acute distress HEENT: atraumatic, conjunctiva  clear, no obvious abnormalities on inspection of external nose and ears  NECK: no obvious masses on inspection palpation  LUNGS: clear to auscultation bilaterally, no wheezes, rales or rhonchi, good air movement CV: HRRR, no clubbing cyanosis or  peripheral edema nl cap refill  MS: moves all extremities without noticeable focal  abnormality PSYCH: pleasant and cooperative, no obvious depression or anxiety Lab Results  Component Value Date   WBC 7.1 01/12/2016   HGB 13.5 01/12/2016   HCT 40.8 01/12/2016   PLT 268 01/12/2016   GLUCOSE 92 01/12/2016   ALT 15 01/12/2016   AST 18 01/12/2016   NA 135 01/12/2016   K 4.2 01/12/2016   CL 103 01/12/2016   CREATININE 0.80 01/12/2016   BUN 5 (L) 01/12/2016   CO2 25 01/12/2016  TSH 0.81 08/16/2017   HGBA1C  4.4 12/23/2016   BP Readings from Last 3 Encounters:  03/28/19 112/62  06/20/18 104/78  04/24/18 98/60    ASSESSMENT AND PLAN:  Discussed the following assessment and plan:   ICD-10-CM   1. Migraine without aura and without status migrainosus, not intractable  G43.009 Ambulatory referral to Neurology  2. Medication management  Z79.899 Ambulatory referral to Neurology  3. Need for HPV vaccination  Z23 HPV 9-valent vaccine,Recombinat  4. Multiple allergies  Z88.9    Will do referral for migraines with menstrual  flaring   Medication management issues in the past   Problematic  Try continuous ocps as tolerated in interim .  Refill epi pen and valtrex  -Patient advised to return or notify health care team  if  new concerns arise.  Patient Instructions  consider    continous   ocps   And talk with gyne and   neruo about this.   May help decrease  Hormone trigger for your headache.   WIll do a referral  To neuro as discussed and will refill medications  Let us know how we can help.        Neta MendsWanda K. Nina [name] M.D.

## 2019-03-28 ENCOUNTER — Encounter: Payer: Self-pay | Admitting: Internal Medicine

## 2019-03-28 ENCOUNTER — Other Ambulatory Visit: Payer: Self-pay

## 2019-03-28 ENCOUNTER — Ambulatory Visit (INDEPENDENT_AMBULATORY_CARE_PROVIDER_SITE_OTHER): Payer: BC Managed Care – PPO | Admitting: Internal Medicine

## 2019-03-28 VITALS — BP 112/62 | HR 96 | Temp 97.2°F | Wt 164.4 lb

## 2019-03-28 DIAGNOSIS — Z889 Allergy status to unspecified drugs, medicaments and biological substances status: Secondary | ICD-10-CM

## 2019-03-28 DIAGNOSIS — Z23 Encounter for immunization: Secondary | ICD-10-CM

## 2019-03-28 DIAGNOSIS — Z79899 Other long term (current) drug therapy: Secondary | ICD-10-CM

## 2019-03-28 DIAGNOSIS — G43009 Migraine without aura, not intractable, without status migrainosus: Secondary | ICD-10-CM | POA: Diagnosis not present

## 2019-03-28 MED ORDER — EPINEPHRINE 0.3 MG/0.3ML IJ SOAJ: INTRAMUSCULAR | 1 refills | Status: DC

## 2019-03-28 MED ORDER — VALACYCLOVIR HCL 1 G PO TABS: ORAL_TABLET | ORAL | 3 refills | Status: DC

## 2019-03-28 NOTE — Patient Instructions (Addendum)
consider    continous   ocps   And talk with gyne and   neruo about this.   May help decrease  Hormone trigger for your headache.   WIll do a referral  To neuro as discussed and will refill medications  Let us know how we can help.

## 2019-03-29 DIAGNOSIS — F319 Bipolar disorder, unspecified: Secondary | ICD-10-CM | POA: Diagnosis not present

## 2019-03-29 DIAGNOSIS — F411 Generalized anxiety disorder: Secondary | ICD-10-CM | POA: Diagnosis not present

## 2019-04-02 DIAGNOSIS — F319 Bipolar disorder, unspecified: Secondary | ICD-10-CM | POA: Diagnosis not present

## 2019-04-02 DIAGNOSIS — F411 Generalized anxiety disorder: Secondary | ICD-10-CM | POA: Diagnosis not present

## 2019-04-05 DIAGNOSIS — F411 Generalized anxiety disorder: Secondary | ICD-10-CM | POA: Diagnosis not present

## 2019-04-05 DIAGNOSIS — F319 Bipolar disorder, unspecified: Secondary | ICD-10-CM | POA: Diagnosis not present

## 2019-04-09 DIAGNOSIS — F319 Bipolar disorder, unspecified: Secondary | ICD-10-CM | POA: Diagnosis not present

## 2019-04-09 DIAGNOSIS — F411 Generalized anxiety disorder: Secondary | ICD-10-CM | POA: Diagnosis not present

## 2019-04-12 DIAGNOSIS — F411 Generalized anxiety disorder: Secondary | ICD-10-CM | POA: Diagnosis not present

## 2019-04-12 DIAGNOSIS — F319 Bipolar disorder, unspecified: Secondary | ICD-10-CM | POA: Diagnosis not present

## 2019-04-16 DIAGNOSIS — F411 Generalized anxiety disorder: Secondary | ICD-10-CM | POA: Diagnosis not present

## 2019-04-16 DIAGNOSIS — F319 Bipolar disorder, unspecified: Secondary | ICD-10-CM | POA: Diagnosis not present

## 2019-04-19 DIAGNOSIS — F411 Generalized anxiety disorder: Secondary | ICD-10-CM | POA: Diagnosis not present

## 2019-04-19 DIAGNOSIS — F319 Bipolar disorder, unspecified: Secondary | ICD-10-CM | POA: Diagnosis not present

## 2019-04-23 DIAGNOSIS — F411 Generalized anxiety disorder: Secondary | ICD-10-CM | POA: Diagnosis not present

## 2019-04-23 DIAGNOSIS — F319 Bipolar disorder, unspecified: Secondary | ICD-10-CM | POA: Diagnosis not present

## 2019-04-24 DIAGNOSIS — F310 Bipolar disorder, current episode hypomanic: Secondary | ICD-10-CM | POA: Diagnosis not present

## 2019-04-24 DIAGNOSIS — F410 Panic disorder [episodic paroxysmal anxiety] without agoraphobia: Secondary | ICD-10-CM | POA: Diagnosis not present

## 2019-04-25 DIAGNOSIS — M1711 Unilateral primary osteoarthritis, right knee: Secondary | ICD-10-CM | POA: Diagnosis not present

## 2019-04-26 DIAGNOSIS — F319 Bipolar disorder, unspecified: Secondary | ICD-10-CM | POA: Diagnosis not present

## 2019-04-26 DIAGNOSIS — F411 Generalized anxiety disorder: Secondary | ICD-10-CM | POA: Diagnosis not present

## 2019-05-02 DIAGNOSIS — M1711 Unilateral primary osteoarthritis, right knee: Secondary | ICD-10-CM | POA: Diagnosis not present

## 2019-05-03 DIAGNOSIS — F411 Generalized anxiety disorder: Secondary | ICD-10-CM | POA: Diagnosis not present

## 2019-05-03 DIAGNOSIS — F319 Bipolar disorder, unspecified: Secondary | ICD-10-CM | POA: Diagnosis not present

## 2019-05-07 DIAGNOSIS — F411 Generalized anxiety disorder: Secondary | ICD-10-CM | POA: Diagnosis not present

## 2019-05-07 DIAGNOSIS — F319 Bipolar disorder, unspecified: Secondary | ICD-10-CM | POA: Diagnosis not present

## 2019-05-09 DIAGNOSIS — M1711 Unilateral primary osteoarthritis, right knee: Secondary | ICD-10-CM | POA: Diagnosis not present

## 2019-05-10 DIAGNOSIS — F411 Generalized anxiety disorder: Secondary | ICD-10-CM | POA: Diagnosis not present

## 2019-05-10 DIAGNOSIS — F319 Bipolar disorder, unspecified: Secondary | ICD-10-CM | POA: Diagnosis not present

## 2019-05-14 DIAGNOSIS — F319 Bipolar disorder, unspecified: Secondary | ICD-10-CM | POA: Diagnosis not present

## 2019-05-14 DIAGNOSIS — F411 Generalized anxiety disorder: Secondary | ICD-10-CM | POA: Diagnosis not present

## 2019-05-17 ENCOUNTER — Other Ambulatory Visit: Payer: Self-pay

## 2019-05-17 ENCOUNTER — Ambulatory Visit: Payer: BC Managed Care – PPO | Admitting: Neurology

## 2019-05-17 ENCOUNTER — Encounter: Payer: Self-pay | Admitting: Neurology

## 2019-05-17 VITALS — BP 102/70 | HR 76 | Temp 97.7°F | Ht 65.0 in | Wt 160.0 lb

## 2019-05-17 DIAGNOSIS — G43109 Migraine with aura, not intractable, without status migrainosus: Secondary | ICD-10-CM

## 2019-05-17 DIAGNOSIS — F411 Generalized anxiety disorder: Secondary | ICD-10-CM | POA: Diagnosis not present

## 2019-05-17 DIAGNOSIS — F319 Bipolar disorder, unspecified: Secondary | ICD-10-CM | POA: Diagnosis not present

## 2019-05-17 DIAGNOSIS — G43709 Chronic migraine without aura, not intractable, without status migrainosus: Secondary | ICD-10-CM | POA: Diagnosis not present

## 2019-05-17 MED ORDER — AJOVY 225 MG/1.5ML ~~LOC~~ SOAJ: 225.0000 mg | SUBCUTANEOUS | 11 refills | Status: DC

## 2019-05-17 MED ORDER — NURTEC 75 MG PO TBDP: 75.0000 mg | ORAL_TABLET | Freq: Every day | ORAL | 6 refills | Status: DC | PRN

## 2019-05-17 NOTE — Progress Notes (Signed)
[facility]    Provider:  Dr Lucia Gaskins Requesting Provider: Fabian Sharp [name], MD Primary Care Provider:  Madelin Headings, MD  CC:  Migraines  HPI:  Catherine [name] is a 28 y.o. female here as requested by Madelin Headings, MD for migraines sine the age of 1 since puberty. She has seen Dr. Vickey Huger. She was on multiple medications. She has tried multiple birth controls to see if this woul dhelp with her migraines. She has PCOS diagnosed at the age of 24. Migraines are very hormonal. She gets migraines a few days after starting her period. Over the last 6 months she has been having them all throughout the month not just at her period. 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 She tries to use Tylenol, doesn't help. She has sensitivity to light and migraine location varies between sides or both, she has migraines without aura and also with aura - the  aura is swirling lights and blocks but not all the time she also has migraines without aura. Endorses photophobia, phonophbia, pounding, throbbing,nausea,vomiting. A dark cool room helps, moving makes it work, smells may trigger. Have lasted 24-48 hours. She has daily headaches. She has easily 8 migraine days a month and they moderately severe to severe and interfering to her life ongoing over 6 months. No other focal neurologic deficits, associated symptoms, inciting events or modifiable factors.   meds tried: Lamictal, paxil, Topamax(gave her gout and kidney stones), amitriptyline, verapamil, tried other medications as well  Reviewed notes, labs and imaging from outside physicians, which showed:  TSH/T4 Free 08/16/2017 normal  Reviewed notes from Dr. Fabian Sharp, she has had migraines since a teen, mostly migraines during menstrual cycle but worsening over the years, more frequent. She has a remote hx of neurologic care, requested neurology, past meds not helping, kidney stones with topamax. 10 hours of  sleep a night, 5 cats. 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 On Lamotrigine for psychiatric d/o.Reviewed examination, normal gen,heent,neck,lungs,CV,psych,musculoskeletal  I will request notes from [facility] where she was seen in the past.  Review of Systems: Patient complains of symptoms per HPI as well as the following symptoms: headache, anxiety, bipolar. Pertinent negatives and positives per HPI. All others negative.   Social History   Socioeconomic History  . Marital status: Single    Spouse name: Not on file  . Number of children: 0  . Years of education: Not on file  . Highest education level: Not on file  Occupational History  . 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 Financial resource strain: Not on file  . Food insecurity    Worry: Not on file    Inability: Not on file  . Transportation needs    Medical: Not on file    Non-medical: Not on file  Tobacco Use  . Smoking status: Never Smoker  . Smokeless tobacco: Never Used  Substance and Sexual Activity  . Alcohol use: No  . Drug use: No  . Sexual activity: Yes    Birth control/protection: Pill  Lifestyle  . Physical activity    Days per week: Not on file    Minutes per session: Not on file  . Stress: Not on file  Relationships  . Social Musician on phone: Not on file    Gets together: Not on file    Attends religious service: Not  on file    Active member of club or organization: Not on file    Attends meetings of clubs or organizations: Not on file    Relationship status: Not on file  . Intimate partner violence    Fear of current or ex partner: Not on file    Emotionally abused: Not on file    Physically abused: Not on file    Forced sexual activity: Not on file  Other Topics Concern  . Not on file  Social History Narrative   Lives by herself with 5 cats near [place]. apartment. 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 Will be difficult to go back financially.      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 Breast cancer Maternal Grandmother   . Colon cancer Maternal Grandmother   . Colon polyps Maternal Grandmother   . Cancer Maternal Grandmother   . Diabetes Paternal Grandfather   . Lupus Paternal Grandmother   . Thyroid disease Paternal Grandmother        Hypothyroid without surgery or irradiation.  . Osteopenia Mother   . Obesity Mother   . Migraines Father        "severe atypical"  . Migraines Sister     Past Medical History:  Diagnosis Date  . Anxiety    Counseling and psychiatry  . Chronic headaches    Sees specialist  . Dyspepsia   . Fatigue   . Goiter   . Hashimoto's disease   . Hashimoto's thyroiditis   . History of lower GI bleeding   . History of recurrent UTIs    Sees urologist  . History of shingles    face right   . Hx of varicella   . Hypothyroidism, acquired, autoimmune   . IBS (irritable bowel syndrome)   . Obesity   . Oligomenorrhea   . PCOS (polycystic ovarian syndrome)   . PUD (peptic ulcer disease)   . Thyroid disease     Patient Active Problem List   Diagnosis Date Noted  . Chronic migraine without aura without status migrainosus, not intractable 05/20/2019  . Migraine with aura and without status migrainosus, not intractable 05/20/2019  . Bipolar 1 disorder, mixed, moderate ([place]) 06/16/2016  . Mouth ulcer 05/31/2014  . Left facial pain 05/31/2014  . Herpes simplex labialis 04/17/2013  . Vaginal lesions 10/06/2012  . Gout attack possible.  09/05/2012  . Foot swelling 09/05/2012  . Hx of urinary stone 09/05/2012  . Dermatitis 09/03/2012  .  Bright red rectal bleeding 09/01/2012  . Diarrhea 09/01/2012  . Adjustment disorder with anxiety 05/31/2012  . Menstrual migraine 05/31/2012  . Vitamin D deficiency 05/31/2012  . Hx of fracture of leg 05/31/2012  . History of recurrent UTIs   . Oligomenorrhea   . Goiter   . Hashimoto's thyroiditis   . Dyspepsia   . Hypothyroidism, acquired, autoimmune   . Fatigue   . IBS (irritable bowel syndrome) 05/11/2011  . PCOS (polycystic ovarian syndrome) 12/31/2010  . Obesity 12/31/2010    Past Surgical History:  Procedure Laterality Date  . BAND HEMORRHOIDECTOMY    .  KNEE SURGERY     x 2 femoral.  break  sp fall  7th grade   . knees    . WISDOM TOOTH EXTRACTION      Current Outpatient Medications  Medication Sig Dispense Refill  . acetaminophen (TYLENOL) 650 MG CR tablet Take 650-1,300 mg by mouth every 8 (eight) hours as needed for pain.    [name] ALPRAZolam (XANAX XR) 0.5 MG 24 hr tablet Take 0.5 mg by mouth 2 (two) times daily as needed for anxiety.     . Calcium Carbonate-Vitamin D (CALCIUM 500 + D PO) Take 1 tablet by mouth daily after breakfast.     . cetirizine (ZYRTEC) 10 MG tablet Take 10 mg by mouth daily.      . diclofenac sodium (VOLTAREN) 1 % GEL Apply 4 g topically 4 (four) times daily as needed. 200 g 1  . EPINEPHrine (EPIPEN 2-PAK) 0.3 mg/0.3 mL IJ SOAJ injection INJECT 1 SYRINGE INTO THE MUSCLE AS NEEDED 1 each 1  . lamoTRIgine (LAMICTAL) 150 MG tablet Take 300 mg by mouth daily.    . metFORMIN (GLUCOPHAGE) 500 MG tablet Take 500 mg by mouth daily with breakfast.    . Multiple Vitamin (MULTI-VITAMIN PO) Take 1 tablet by mouth daily after breakfast.     . Norethin-Eth Estradiol-Fe (WYMZYA FE) 0.4-35 MG-MCG tablet Chew 1 tablet by mouth daily.    . Probiotic Product (PROBIOTIC PO) Take 1 capsule by mouth every morning.     . valACYclovir (VALTREX) 1000 MG tablet TAKE 2 TABLETS BY MOUTH TWICE A DAY OR AS DIRECTED 30 tablet 3  . Fremanezumab-vfrm (AJOVY) 225 MG/1.5ML SOAJ  Inject 225 mg into the skin every 30 (thirty) days. 1 pen 11  . Rimegepant Sulfate (NURTEC) 75 MG TBDP Take 75 mg by mouth daily as needed. For migraines. Take as close to onset of migraine as possible. One daily maximum. 10 tablet 6   No current facility-administered medications for this visit.     Allergies as of 05/17/2019 - Review Complete 05/17/2019  Allergen Reaction Noted  . Ibuprofen Other (See Comments) 03/01/2018  . Milk-related compounds Anaphylaxis 01/20/2013  . Nsaids Other (See Comments) 03/29/2011  . Onion Diarrhea and Nausea Only 01/12/2016  . Penicillins Other (See Comments) 07/24/2011    Vitals: BP 102/70 (BP Location: Right Arm, Patient Position: Sitting)   Pulse 76   Temp 97.7 F (36.5 C) Comment: taken by check-in staff  Ht  (1.651 m)   Wt 160 lb (72.6 kg)   LMP 05/06/2019 (Approximate)   BMI 26.63 kg/m  Last Weight:  Wt Readings from Last 1 Encounters:  05/17/19 160 lb (72.6 kg)   Last Height:   Ht Readings from Last 1 Encounters:  05/17/19  (1.651 m)     Physical exam: Exam: Gen: NAD, conversant, well nourised, well groomed                     CV: RRR, no MRG. No Carotid Bruits. No peripheral edema, warm, nontender Eyes: Conjunctivae clear without exudates or hemorrhage  Neuro: Detailed Neurologic Exam  Speech:    Speech is normal; fluent and spontaneous with normal comprehension.  Cognition:    The patient is oriented to person, place, and time;     recent and remote memory intact;     language fluent;     normal attention, concentration,     fund of knowledge Cranial Nerves:    The pupils are equal, round, and  reactive to light. The fundi are normal and spontaneous venous pulsations are present. Visual fields are full to finger confrontation. Extraocular movements are intact. Trigeminal sensation is intact and the muscles of mastication are normal. The face is symmetric. The palate elevates in the midline. Hearing intact. Voice  is normal. Shoulder shrug is normal. The tongue has normal motion without fasciculations.   Coordination:    No dysmetria  Gait:    Normal native gait  Motor Observation:    No asymmetry, no atrophy, and no involuntary movements noted. Tone:    Normal muscle tone.    Posture:    Posture is normal. normal erect    Strength:    Strength is V/V in the upper and lower limbs.      Sensation: intact to LT     Reflex Exam:  DTR's:    Deep tendon reflexes in the upper and lower extremities are normal bilaterally.   Toes:    The toes are downgoing bilaterally.   Clonus:    Clonus is absent.    Assessment/Plan:  28 year old with Chronic Migraines without and with aura failed multiple classes of first-lime migraine preventative medications. meds tried: Lamictal, paxil, Topamax(gave her gout and kidney stones), amitriptyline, verapamil, tried other medications as well. Discussed options for acute and preventative treatments, oral medications, injections, botox.   - Nurtec once daily as needed at the start of migraine acute management - Ajovy monthly for prevention - Do not get pregnant on these medications due to risk of birth defects, use 2 forms of contraception - Exam normal, no changes in bowel/badder, no weakness, no new vision changes or sensory changes, no red flags, no changes in quality or severity of migraines, not exertional or positional. Can hold off on MRI brain at this time but if headaches continue I do think it would be prudent to order imaging of brain, discussed.   Discussed: There is increased risk for stroke in women with migraine with aura and a contraindication for the combined contraceptive pill for use by women who have migraine with aura. The risk for women with migraine without aura is lower. However other risk factors like smoking are far more likely to increase stroke risk than migraine. There is a recommendation for no smoking and for the use of OCPs without  estrogen such as progestogen only pills particularly for women with migraine with aura.[name]. People who have migraine headaches with auras may be 3 times more likely to have a stroke caused by a blood clot, compared to migraine patients who don't see auras. Women who take hormone-replacement therapy may be 30 percent more likely to suffer a clot-based stroke than women not taking medication containing estrogen. Other risk factors like smoking and high blood pressure may be  much more important.  Discussed: To prevent or relieve headaches, try the following: Cool Compress. Lie down and place a cool compress on your head.  Avoid headache triggers. If certain foods or odors seem to have triggered your migraines in the past, avoid them. A headache diary might help you identify triggers.  Include physical activity in your daily routine. Try a daily walk or other moderate aerobic exercise.  Manage stress. Find healthy ways to cope with the stressors, such as delegating tasks on your to-do list.  Practice relaxation techniques. Try deep breathing, yoga, massage and visualization.  Eat regularly. Eating regularly scheduled meals and maintaining a healthy diet might help prevent headaches. Also, drink plenty of fluids.  Follow a regular sleep schedule. Sleep deprivation might contribute to headaches Consider biofeedback. With this mind-body technique, you learn to control certain bodily functions - such as muscle tension, heart rate and blood pressure - to prevent headaches or reduce headache pain.    Proceed to emergency room if you experience new or worsening symptoms or symptoms do not resolve, if you have new neurologic symptoms or if headache is severe, or for any concerning symptom.   Provided education and documentation from American headache Society toolbox including articles on: chronic migraine medication overuse headache, chronic migraines, prevention of migraines, behavioral and other nonpharmacologic  treatments for headache.  Meds ordered this encounter  Medications  . Fremanezumab-vfrm (AJOVY) 225 MG/1.5ML SOAJ    Sig: Inject 225 mg into the skin every 30 (thirty) days.    Dispense:  1 pen    Refill:  11    Patient has copay card; she can have medication  regardless of insurance approval or copay amount.  . Rimegepant Sulfate (NURTEC) 75 MG TBDP    Sig: Take 75 mg by mouth daily as needed. For migraines. Take as close to onset of migraine as possible. One daily maximum.    Dispense:  10 tablet    Refill:  6    Patient has copay card; she can have medication for $5 regardless of insurance approval or copay amount.    Cc: Panosh, [name], MD  Naomie Dean, MD  [facility] [address] [place], [place] [address]  Phone [phone] Fax [phone]

## 2019-05-17 NOTE — Patient Instructions (Addendum)
Nurtec once daily as needed at the start of migraine acute management Ajovy monthly for prevention    There is increased risk for stroke in women with migraine with aura and a contraindication for the combined contraceptive pill for use by women who have migraine with aura. The risk for women with migraine without aura is lower. However other risk factors like smoking are far more likely to increase stroke risk than migraine. There is a recommendation for no smoking and for the use of OCPs without estrogen such as progestogen only pills particularly for women with migraine with aura.[name]. People who have migraine headaches with auras may be 3 times more likely to have a stroke caused by a blood clot, compared to migraine patients who don't see auras. Women who take hormone-replacement therapy may be 30 percent more likely to suffer a clot-based stroke than women not taking medication containing estrogen. Other risk factors like smoking and high blood pressure may be  much more important.   Rimegepant: Patient drug information  Access [employer] Online here.  Copyright (918) 066-96581978-2020 [employer] All rights reserved.  (For additional information see "Rimegepant: Drug information") Brand Names: [place]  Nurtec What is this drug used for?  . It is used to treat migraine headaches. What do I need to tell my doctor BEFORE I take this drug?  . If you are allergic to this drug; any part of this drug; or any other drugs, foods, or substances. Tell your doctor about the allergy and what signs you had.  . If you have any of these health problems: Kidney disease or liver disease.  . If you take any drugs (prescription or OTC, natural products, vitamins) that must not be taken with this drug, like certain drugs that are used for HIV, infections, or seizures. There are many drugs that must not be taken with this drug.  This is not a list of all drugs or health problems that interact with this drug.  Tell your doctor and  pharmacist about all of your drugs (prescription or OTC, natural products, vitamins) and health problems. You must check to make sure that it is safe for you to take this drug with all of your drugs and health problems. Do not start, stop, or change the dose of any drug without checking with your doctor. What are some things I need to know or do while I take this drug?  . Tell all of your health care providers that you take this drug. This includes your doctors, nurses, pharmacists, and dentists.  . This drug is not meant to prevent or lower the number of migraine headaches you get.  . Tell your doctor if you are pregnant, plan on getting pregnant, or are breast-feeding. You will need to talk about the benefits and risks to you and the baby. What are some side effects that I need to call my doctor about right away?  WARNING/CAUTION: Even though it may be rare, some people may have very bad and sometimes deadly side effects when taking a drug. Tell your doctor or get medical help right away if you have any of the following signs or symptoms that may be related to a very bad side effect:  . Signs of an allergic reaction, like rash; hives; itching; red, swollen, blistered, or peeling skin with or without fever; wheezing; tightness in the chest or throat; trouble breathing, swallowing, or talking; unusual hoarseness; or swelling of the mouth, face, lips, tongue, or throat. What are some other  side effects of this drug?  All drugs may cause side effects. However, many people have no side effects or only have minor side effects. Call your doctor or get medical help if any of these side effects or any other side effects bother you or do not go away:  . Upset stomach.  These are not all of the side effects that may occur. If you have questions about side effects, call your doctor. Call your doctor for medical advice about side effects.  You may report side effects to your national health agency. How is this  drug best taken?  Use this drug as ordered by your doctor. Read all information given to you. Follow all instructions closely.  . Do not push the tablet out of the foil when opening. Use dry hands to take it from the foil. Place on your tongue and let it dissolve. Water is not needed. Do not swallow it whole. Do not chew, break, or crush it.  . If needed, you may place the tablet under the tongue.  . Use right after opening. What do I do if I miss a dose?  [name] This drug is taken on an as needed basis. Do not take more often than told by the doctor. How do I store and/or throw out this drug?  . Store at room temperature in a dry place. Do not store in a bathroom.  . Store in foil pouch until ready for use.  [name] Keep all drugs in a safe place. Keep all drugs out of the reach of children and pets.  . Throw away unused or expired drugs. Do not flush down a toilet or pour down a drain unless you are told to do so. Check with your pharmacist if you have questions about the best way to throw out drugs. There may be drug take-back programs in your area. General drug facts  . If your symptoms or health problems do not get better or if they become worse, call your doctor.  . Do not share your drugs with others and do not take anyone else's drugs.  . Some drugs may have another patient information leaflet. If you have any questions about this drug, please talk with your doctor, nurse, pharmacist, or other health care provider.  . If you think there has been an overdose, call your poison control center or get medical care right away. Be ready to tell or show what was taken, how much, and when it happened.  Fremanezumab injection What is this medicine? FREMANEZUMAB (fre ma NEZ ue mab) is used to prevent migraine headaches. This medicine may be used for other purposes; ask your health care provider or pharmacist if you have questions. COMMON BRAND NAME(S): AJOVY What should I tell my health care provider before  I take this medicine? They need to know if you have any of these conditions:  an unusual or allergic reaction to fremanezumab, other medicines, foods, dyes, or preservatives  pregnant or trying to get pregnant  breast-feeding How should I use this medicine? This medicine is for injection under the skin. You will be taught how to prepare and give this medicine. Use exactly as directed. Take your medicine at regular intervals. Do not take your medicine more often than directed. It is important that you put your used needles and syringes in a special sharps container. Do not put them in a trash can. If you do not have a sharps container, call your pharmacist or healthcare provider to  get one. Talk to your pediatrician regarding the use of this medicine in children. Special care may be needed. Overdosage: If you think you have taken too much of this medicine contact a poison control center or emergency room at once. NOTE: This medicine is only for you. Do not share this medicine with others. What if I miss a dose? If you miss a dose, take it as soon as you can. If it is almost time for your next dose, take only that dose. Do not take double or extra doses. What may interact with this medicine? Interactions are not expected. This list may not describe all possible interactions. Give your health care provider a list of all the medicines, herbs, non-prescription drugs, or dietary supplements you use. Also tell them if you smoke, drink alcohol, or use illegal drugs. Some items may interact with your medicine. What should I watch for while using this medicine? Tell your doctor or healthcare professional if your symptoms do not start to get better or if they get worse. What side effects may I notice from receiving this medicine? Side effects that you should report to your doctor or health care professional as soon as possible:  allergic reactions like skin rash, itching or hives, swelling of the face,  lips, or tongue Side effects that usually do not require medical attention (report these to your doctor or health care professional if they continue or are bothersome):  pain, redness, or irritation at site where injected This list may not describe all possible side effects. Call your doctor for medical advice about side effects. You may report side effects to FDA at 1-800-FDA-1088. Where should I keep my medicine? Keep out of the reach of children. You will be instructed on how to store this medicine. Throw away any unused medicine after the expiration date on the label. NOTE: This sheet is a summary. It may not cover all possible information. If you have questions about this medicine, talk to your doctor, pharmacist, or health care provider.  2020 Elsevier/Gold Standard (2017-05-02 17:22:56)

## 2019-05-20 ENCOUNTER — Encounter: Payer: Self-pay | Admitting: Neurology

## 2019-05-20 DIAGNOSIS — G43109 Migraine with aura, not intractable, without status migrainosus: Secondary | ICD-10-CM | POA: Insufficient documentation

## 2019-05-20 DIAGNOSIS — G43709 Chronic migraine without aura, not intractable, without status migrainosus: Secondary | ICD-10-CM | POA: Insufficient documentation

## 2019-05-21 DIAGNOSIS — F319 Bipolar disorder, unspecified: Secondary | ICD-10-CM | POA: Diagnosis not present

## 2019-05-21 DIAGNOSIS — F411 Generalized anxiety disorder: Secondary | ICD-10-CM | POA: Diagnosis not present

## 2019-05-24 ENCOUNTER — Telehealth: Payer: Self-pay | Admitting: Neurology

## 2019-05-24 ENCOUNTER — Telehealth: Payer: Self-pay | Admitting: *Deleted

## 2019-05-24 NOTE — Telephone Encounter (Signed)
Completed PA on CMM. Pending determination. See other phone note.

## 2019-05-24 NOTE — Telephone Encounter (Signed)
Pt called in and stated her insurance is requiring a PA to be done for her to receive Rimegepant Sulfate (NURTEC) 75 MG TBDP

## 2019-05-24 NOTE — Telephone Encounter (Signed)
Nurtec PA completed on CMM. Key: [id number]. Awaiting [employer] determination.

## 2019-05-28 ENCOUNTER — Encounter: Payer: Self-pay | Admitting: *Deleted

## 2019-05-28 NOTE — Telephone Encounter (Signed)
Received denial of Nurtec. Insurance requires pt try two different triptan medications (such as rizatriptan, sumatriptan). Pt can still get the Nurtec for as low as $0 copay regardless of denial. If we should choose to appeal, fax to [phone].   I messaged pt with update in mychart.

## 2019-05-31 DIAGNOSIS — F411 Generalized anxiety disorder: Secondary | ICD-10-CM | POA: Diagnosis not present

## 2019-05-31 DIAGNOSIS — F319 Bipolar disorder, unspecified: Secondary | ICD-10-CM | POA: Diagnosis not present

## 2019-06-04 DIAGNOSIS — F319 Bipolar disorder, unspecified: Secondary | ICD-10-CM | POA: Diagnosis not present

## 2019-06-04 DIAGNOSIS — F411 Generalized anxiety disorder: Secondary | ICD-10-CM | POA: Diagnosis not present

## 2019-06-07 DIAGNOSIS — F319 Bipolar disorder, unspecified: Secondary | ICD-10-CM | POA: Diagnosis not present

## 2019-06-07 DIAGNOSIS — F411 Generalized anxiety disorder: Secondary | ICD-10-CM | POA: Diagnosis not present

## 2019-06-11 DIAGNOSIS — F319 Bipolar disorder, unspecified: Secondary | ICD-10-CM | POA: Diagnosis not present

## 2019-06-11 DIAGNOSIS — F411 Generalized anxiety disorder: Secondary | ICD-10-CM | POA: Diagnosis not present

## 2019-06-18 ENCOUNTER — Telehealth: Payer: Self-pay | Admitting: *Deleted

## 2019-06-18 ENCOUNTER — Encounter: Payer: Self-pay | Admitting: *Deleted

## 2019-06-18 DIAGNOSIS — F319 Bipolar disorder, unspecified: Secondary | ICD-10-CM | POA: Diagnosis not present

## 2019-06-18 DIAGNOSIS — F411 Generalized anxiety disorder: Secondary | ICD-10-CM | POA: Diagnosis not present

## 2019-06-18 NOTE — Telephone Encounter (Signed)
Ajovy denied by [facility] d/t pt not meeting requirements. Aimovig and Emgality preferred. Per [employer] they must have been tried and did not work, or both were not tolerated.   If we should choose to appeal, fax it within 180 days to [phone].   Pt should be able to use the savings card to get this for $5/month x 1 year even though it was denied by insurance.   Sent pt a Pharmacist, community message about the card.

## 2019-06-18 NOTE — Telephone Encounter (Signed)
Completed Ajovy PA on CMM. [facility]son Fret - Rx #: [id number]. Awaiting [employer] determination.

## 2019-06-21 DIAGNOSIS — F319 Bipolar disorder, unspecified: Secondary | ICD-10-CM | POA: Diagnosis not present

## 2019-06-21 DIAGNOSIS — F411 Generalized anxiety disorder: Secondary | ICD-10-CM | POA: Diagnosis not present

## 2019-06-28 DIAGNOSIS — F411 Generalized anxiety disorder: Secondary | ICD-10-CM | POA: Diagnosis not present

## 2019-06-28 DIAGNOSIS — F319 Bipolar disorder, unspecified: Secondary | ICD-10-CM | POA: Diagnosis not present

## 2019-07-02 DIAGNOSIS — F411 Generalized anxiety disorder: Secondary | ICD-10-CM | POA: Diagnosis not present

## 2019-07-02 DIAGNOSIS — F319 Bipolar disorder, unspecified: Secondary | ICD-10-CM | POA: Diagnosis not present

## 2019-07-05 DIAGNOSIS — F319 Bipolar disorder, unspecified: Secondary | ICD-10-CM | POA: Diagnosis not present

## 2019-07-05 DIAGNOSIS — F411 Generalized anxiety disorder: Secondary | ICD-10-CM | POA: Diagnosis not present

## 2019-07-09 DIAGNOSIS — F319 Bipolar disorder, unspecified: Secondary | ICD-10-CM | POA: Diagnosis not present

## 2019-07-09 DIAGNOSIS — F411 Generalized anxiety disorder: Secondary | ICD-10-CM | POA: Diagnosis not present

## 2019-07-16 DIAGNOSIS — F319 Bipolar disorder, unspecified: Secondary | ICD-10-CM | POA: Diagnosis not present

## 2019-07-16 DIAGNOSIS — F411 Generalized anxiety disorder: Secondary | ICD-10-CM | POA: Diagnosis not present

## 2019-07-17 NOTE — Telephone Encounter (Signed)
We received another Ajovy PA. I called [facility] and spoke with Portia. She stated the Ajovy went through [facility] for $4.99 and it is ready to be picked up. She stated she would give the patient a call in addition to the notifications pt gets to her mobile number [phone].

## 2019-07-23 DIAGNOSIS — F411 Generalized anxiety disorder: Secondary | ICD-10-CM | POA: Diagnosis not present

## 2019-07-23 DIAGNOSIS — F319 Bipolar disorder, unspecified: Secondary | ICD-10-CM | POA: Diagnosis not present

## 2019-07-26 DIAGNOSIS — F411 Generalized anxiety disorder: Secondary | ICD-10-CM | POA: Diagnosis not present

## 2019-07-26 DIAGNOSIS — F319 Bipolar disorder, unspecified: Secondary | ICD-10-CM | POA: Diagnosis not present

## 2019-08-02 DIAGNOSIS — F319 Bipolar disorder, unspecified: Secondary | ICD-10-CM | POA: Diagnosis not present

## 2019-08-02 DIAGNOSIS — F411 Generalized anxiety disorder: Secondary | ICD-10-CM | POA: Diagnosis not present

## 2019-08-03 DIAGNOSIS — F319 Bipolar disorder, unspecified: Secondary | ICD-10-CM | POA: Diagnosis not present

## 2019-08-03 DIAGNOSIS — F411 Generalized anxiety disorder: Secondary | ICD-10-CM | POA: Diagnosis not present

## 2019-08-06 DIAGNOSIS — F319 Bipolar disorder, unspecified: Secondary | ICD-10-CM | POA: Diagnosis not present

## 2019-08-06 DIAGNOSIS — F411 Generalized anxiety disorder: Secondary | ICD-10-CM | POA: Diagnosis not present

## 2019-08-13 DIAGNOSIS — F411 Generalized anxiety disorder: Secondary | ICD-10-CM | POA: Diagnosis not present

## 2019-08-13 DIAGNOSIS — F319 Bipolar disorder, unspecified: Secondary | ICD-10-CM | POA: Diagnosis not present

## 2019-08-14 DIAGNOSIS — Z6827 Body mass index (BMI) 27.0-27.9, adult: Secondary | ICD-10-CM | POA: Diagnosis not present

## 2019-08-14 DIAGNOSIS — Z01419 Encounter for gynecological examination (general) (routine) without abnormal findings: Secondary | ICD-10-CM | POA: Diagnosis not present

## 2019-08-20 DIAGNOSIS — F411 Generalized anxiety disorder: Secondary | ICD-10-CM | POA: Diagnosis not present

## 2019-08-20 DIAGNOSIS — F319 Bipolar disorder, unspecified: Secondary | ICD-10-CM | POA: Diagnosis not present

## 2019-08-27 DIAGNOSIS — F411 Generalized anxiety disorder: Secondary | ICD-10-CM | POA: Diagnosis not present

## 2019-08-27 DIAGNOSIS — F319 Bipolar disorder, unspecified: Secondary | ICD-10-CM | POA: Diagnosis not present

## 2019-08-30 DIAGNOSIS — F411 Generalized anxiety disorder: Secondary | ICD-10-CM | POA: Diagnosis not present

## 2019-08-30 DIAGNOSIS — F319 Bipolar disorder, unspecified: Secondary | ICD-10-CM | POA: Diagnosis not present

## 2019-09-03 DIAGNOSIS — F319 Bipolar disorder, unspecified: Secondary | ICD-10-CM | POA: Diagnosis not present

## 2019-09-03 DIAGNOSIS — F411 Generalized anxiety disorder: Secondary | ICD-10-CM | POA: Diagnosis not present

## 2019-09-10 DIAGNOSIS — F411 Generalized anxiety disorder: Secondary | ICD-10-CM | POA: Diagnosis not present

## 2019-09-10 DIAGNOSIS — F319 Bipolar disorder, unspecified: Secondary | ICD-10-CM | POA: Diagnosis not present

## 2019-09-13 DIAGNOSIS — F319 Bipolar disorder, unspecified: Secondary | ICD-10-CM | POA: Diagnosis not present

## 2019-09-13 DIAGNOSIS — F411 Generalized anxiety disorder: Secondary | ICD-10-CM | POA: Diagnosis not present

## 2019-09-17 DIAGNOSIS — F411 Generalized anxiety disorder: Secondary | ICD-10-CM | POA: Diagnosis not present

## 2019-09-17 DIAGNOSIS — F319 Bipolar disorder, unspecified: Secondary | ICD-10-CM | POA: Diagnosis not present

## 2019-09-20 DIAGNOSIS — F411 Generalized anxiety disorder: Secondary | ICD-10-CM | POA: Diagnosis not present

## 2019-09-20 DIAGNOSIS — F319 Bipolar disorder, unspecified: Secondary | ICD-10-CM | POA: Diagnosis not present

## 2019-09-24 DIAGNOSIS — F319 Bipolar disorder, unspecified: Secondary | ICD-10-CM | POA: Diagnosis not present

## 2019-09-24 DIAGNOSIS — F411 Generalized anxiety disorder: Secondary | ICD-10-CM | POA: Diagnosis not present

## 2019-09-27 DIAGNOSIS — F411 Generalized anxiety disorder: Secondary | ICD-10-CM | POA: Diagnosis not present

## 2019-09-27 DIAGNOSIS — F319 Bipolar disorder, unspecified: Secondary | ICD-10-CM | POA: Diagnosis not present

## 2019-10-01 DIAGNOSIS — F319 Bipolar disorder, unspecified: Secondary | ICD-10-CM | POA: Diagnosis not present

## 2019-10-01 DIAGNOSIS — F411 Generalized anxiety disorder: Secondary | ICD-10-CM | POA: Diagnosis not present

## 2019-10-04 DIAGNOSIS — F411 Generalized anxiety disorder: Secondary | ICD-10-CM | POA: Diagnosis not present

## 2019-10-04 DIAGNOSIS — F319 Bipolar disorder, unspecified: Secondary | ICD-10-CM | POA: Diagnosis not present

## 2019-10-08 DIAGNOSIS — F319 Bipolar disorder, unspecified: Secondary | ICD-10-CM | POA: Diagnosis not present

## 2019-10-08 DIAGNOSIS — F411 Generalized anxiety disorder: Secondary | ICD-10-CM | POA: Diagnosis not present

## 2019-10-11 DIAGNOSIS — F319 Bipolar disorder, unspecified: Secondary | ICD-10-CM | POA: Diagnosis not present

## 2019-10-11 DIAGNOSIS — F411 Generalized anxiety disorder: Secondary | ICD-10-CM | POA: Diagnosis not present

## 2019-10-15 DIAGNOSIS — F411 Generalized anxiety disorder: Secondary | ICD-10-CM | POA: Diagnosis not present

## 2019-10-15 DIAGNOSIS — F319 Bipolar disorder, unspecified: Secondary | ICD-10-CM | POA: Diagnosis not present

## 2019-10-16 DIAGNOSIS — F3189 Other bipolar disorder: Secondary | ICD-10-CM | POA: Diagnosis not present

## 2019-10-18 DIAGNOSIS — F411 Generalized anxiety disorder: Secondary | ICD-10-CM | POA: Diagnosis not present

## 2019-10-18 DIAGNOSIS — F319 Bipolar disorder, unspecified: Secondary | ICD-10-CM | POA: Diagnosis not present

## 2019-10-22 DIAGNOSIS — F319 Bipolar disorder, unspecified: Secondary | ICD-10-CM | POA: Diagnosis not present

## 2019-10-22 DIAGNOSIS — F411 Generalized anxiety disorder: Secondary | ICD-10-CM | POA: Diagnosis not present

## 2019-10-29 DIAGNOSIS — F411 Generalized anxiety disorder: Secondary | ICD-10-CM | POA: Diagnosis not present

## 2019-10-29 DIAGNOSIS — F3132 Bipolar disorder, current episode depressed, moderate: Secondary | ICD-10-CM | POA: Diagnosis not present

## 2019-10-29 DIAGNOSIS — F4312 Post-traumatic stress disorder, chronic: Secondary | ICD-10-CM | POA: Diagnosis not present

## 2019-10-29 NOTE — Telephone Encounter (Signed)
Received another Nurtec PA. I completed this on Cover My Meds. Key: [id number]. Awaiting determination. Even if PA is denied again, pt can use savings card for $0 copay. I faxed [facility] pharmacy a message. Received a receipt of confirmation.

## 2019-11-01 ENCOUNTER — Encounter: Payer: Self-pay | Admitting: *Deleted

## 2019-11-01 NOTE — Telephone Encounter (Signed)
Received denial of Nurtec. Insurance requires pt try two different triptan medications (such as rizatriptan, sumatriptan). Pt can still get the Nurtec for as low as $0 copay regardless of denial. If we should choose to appeal, fax to [phone]. I updated pt in mychart.

## 2019-11-05 DIAGNOSIS — F3132 Bipolar disorder, current episode depressed, moderate: Secondary | ICD-10-CM | POA: Diagnosis not present

## 2019-11-05 DIAGNOSIS — F411 Generalized anxiety disorder: Secondary | ICD-10-CM | POA: Diagnosis not present

## 2019-11-05 DIAGNOSIS — F4312 Post-traumatic stress disorder, chronic: Secondary | ICD-10-CM | POA: Diagnosis not present

## 2019-11-08 DIAGNOSIS — F3132 Bipolar disorder, current episode depressed, moderate: Secondary | ICD-10-CM | POA: Diagnosis not present

## 2019-11-08 DIAGNOSIS — F411 Generalized anxiety disorder: Secondary | ICD-10-CM | POA: Diagnosis not present

## 2019-11-08 DIAGNOSIS — F4312 Post-traumatic stress disorder, chronic: Secondary | ICD-10-CM | POA: Diagnosis not present

## 2019-11-15 ENCOUNTER — Other Ambulatory Visit: Payer: Self-pay

## 2019-11-15 ENCOUNTER — Ambulatory Visit: Payer: BC Managed Care – PPO | Admitting: Neurology

## 2019-11-15 ENCOUNTER — Encounter: Payer: Self-pay | Admitting: Neurology

## 2019-11-15 VITALS — BP 111/80 | HR 72 | Temp 97.1°F | Ht 65.0 in | Wt 164.0 lb

## 2019-11-15 DIAGNOSIS — G43709 Chronic migraine without aura, not intractable, without status migrainosus: Secondary | ICD-10-CM | POA: Diagnosis not present

## 2019-11-15 DIAGNOSIS — F4312 Post-traumatic stress disorder, chronic: Secondary | ICD-10-CM | POA: Diagnosis not present

## 2019-11-15 DIAGNOSIS — F411 Generalized anxiety disorder: Secondary | ICD-10-CM | POA: Diagnosis not present

## 2019-11-15 DIAGNOSIS — F3132 Bipolar disorder, current episode depressed, moderate: Secondary | ICD-10-CM | POA: Diagnosis not present

## 2019-11-15 MED ORDER — KETOROLAC TROMETHAMINE 60 MG/2ML IM SOLN
60.0000 mg | Freq: Once | INTRAMUSCULAR | Status: AC
  Administered 2019-11-15: 60 mg via INTRAMUSCULAR

## 2019-11-15 MED ORDER — ONDANSETRON 4 MG PO TBDP: 4.0000 mg | ORAL_TABLET | Freq: Three times a day (TID) | ORAL | 3 refills | Status: DC | PRN

## 2019-11-15 MED ORDER — ZOLMITRIPTAN 5 MG PO TBDP: 5.0000 mg | ORAL_TABLET | ORAL | 11 refills | Status: DC | PRN

## 2019-11-15 NOTE — Patient Instructions (Addendum)
Try Zomig - triptan Try Zofran for nausea and migraine May take all above with Nurtec at once at onset of migraine with ibuprofen or tylenol  Ondansetron tablets What is this medicine? ONDANSETRON (on DAN se tron) is used to treat nausea and vomiting caused by chemotherapy. It is also used to prevent or treat nausea and vomiting after surgery. This medicine may be used for other purposes; ask your health care provider or pharmacist if you have questions. COMMON BRAND NAME(S): Zofran What should I tell my health care provider before I take this medicine? They need to know if you have any of these conditions:  heart disease  history of irregular heartbeat  liver disease  low levels of magnesium or potassium in the blood  an unusual or allergic reaction to ondansetron, granisetron, other medicines, foods, dyes, or preservatives  pregnant or trying to get pregnant  breast-feeding How should I use this medicine? Take this medicine by mouth with a glass of water. Follow the directions on your prescription label. Take your doses at regular intervals. Do not take your medicine more often than directed. Talk to your pediatrician regarding the use of this medicine in children. Special care may be needed. Overdosage: If you think you have taken too much of this medicine contact a poison control center or emergency room at once. NOTE: This medicine is only for you. Do not share this medicine with others. What if I miss a dose? If you miss a dose, take it as soon as you can. If it is almost time for your next dose, take only that dose. Do not take double or extra doses. What may interact with this medicine? Do not take this medicine with any of the following medications:  apomorphine  certain medicines for fungal infections like fluconazole, itraconazole, ketoconazole, posaconazole, voriconazole  cisapride  dronedarone  pimozide  thioridazine This medicine may also interact with the  following medications:  carbamazepine  certain medicines for depression, anxiety, or psychotic disturbances  fentanyl  linezolid  MAOIs like Carbex, Eldepryl, Marplan, Nardil, and Parnate  methylene blue (injected into a vein)  other medicines that prolong the QT interval (cause an abnormal heart rhythm) like dofetilide, ziprasidone  phenytoin  rifampicin  tramadol This list may not describe all possible interactions. Give your health care provider a list of all the medicines, herbs, non-prescription drugs, or dietary supplements you use. Also tell them if you smoke, drink alcohol, or use illegal drugs. Some items may interact with your medicine. What should I watch for while using this medicine? Check with your doctor or health care professional right away if you have any sign of an allergic reaction. What side effects may I notice from receiving this medicine? Side effects that you should report to your doctor or health care professional as soon as possible:  allergic reactions like skin rash, itching or hives, swelling of the face, lips or tongue  breathing problems  confusion  dizziness  fast or irregular heartbeat  feeling faint or lightheaded, falls  fever and chills  loss of balance or coordination  seizures  sweating  swelling of the hands or feet  tightness in the chest  tremors  unusually weak or tired Side effects that usually do not require medical attention (report to your doctor or health care professional if they continue or are bothersome):  constipation or diarrhea  headache This list may not describe all possible side effects. Call your doctor for medical advice about side effects. You  may report side effects to FDA at 1-800-FDA-1088. Where should I keep my medicine? Keep out of the reach of children. Store between 2 and 30 degrees C (36 and 86 degrees F). Throw away any unused medicine after the expiration date. NOTE: This sheet is a  summary. It may not cover all possible information. If you have questions about this medicine, talk to your doctor, pharmacist, or health care provider.  2020 Elsevier/Gold Standard (2018-07-25 07:16:43) Zolmitriptan disintegrating tablets What is this medicine? ZOLMITRIPTAN (zohl mi TRIP tan) is used to treat migraines with or without aura. An aura is a strange feeling or visual disturbance that warns you of an attack. It is not used to prevent migraines. This medicine may be used for other purposes; ask your health care provider or pharmacist if you have questions. COMMON BRAND NAME(S): Zomig -ZMT What should I tell my health care provider before I take this medicine? They need to know if you have any of these conditions:  cigarette smoker  circulation problems in fingers and toes  diabetes  heart disease  high blood pressure  high cholesterol  history of irregular heartbeat  history of stroke  kidney disease  liver disease  stomach or intestine problems  an unusual or allergic reaction to zolmitriptan, other medicines, foods, dyes, or preservatives  pregnant or trying to get pregnant  breast-feeding How should I use this medicine? Take the medicine by mouth. Follow the directions on the prescription label. Leave the tablet in the sealed blister pack until you are ready to take it. With dry hands, open the blister and gently remove the tablet. If the tablet breaks or crumbles, throw it away and take a new tablet out of the blister pack. Place the tablet in the mouth and allow it to dissolve, and then swallow. Do not cut, crush, or chew this medicine. You do not need water to take this medicine. Do not take it more often than directed. Talk to your pediatrician regarding the use of this medicine in children. Special care may be needed. Overdosage: If you think you have taken too much of this medicine contact a poison control center or emergency room at once. NOTE: This  medicine is only for you. Do not share this medicine with others. What if I miss a dose? This does not apply. This medicine is not for regular use. What may interact with this medicine? Do not take this medicine with any of the following medicines:  certain medicines for migraine headache like almotriptan, eletriptan, frovatriptan, naratriptan, rizatriptan, sumatriptan, zolmitriptan  ergot alkaloids like dihydroergotamine, ergonovine, ergotamine, methylergonovine  MAOIs like Carbex, Eldepryl, Marplan, Nardil, and Parnate This medicine may also interact with the following medications:  certain medicines for depression, anxiety, or psychotic disorders  cimetidine This list may not describe all possible interactions. Give your health care provider a list of all the medicines, herbs, non-prescription drugs, or dietary supplements you use. Also tell them if you smoke, drink alcohol, or use illegal drugs. Some items may interact with your medicine. What should I watch for while using this medicine? Visit your healthcare professional for regular checks on your progress. Tell your healthcare professional if your symptoms do not start to get better or if they get worse. You may get drowsy or dizzy. Do not drive, use machinery, or do anything that needs mental alertness until you know how this medicine affects you. Do not stand up or sit up quickly, especially if you are an older patient. This  reduces the risk of dizzy or fainting spells. Alcohol may interfere with the effect of this medicine. Your mouth may get dry. Chewing sugarless gum or sucking hard candy and drinking plenty of water may help. Contact your healthcare professional if the problem does not go away or is severe. Tell your healthcare professional right away if you have any change in your eyesight. If you take migraine medicines for 10 or more days a month, your migraines may get worse. Keep a diary of headache days and medicine use.  Contact your healthcare professional if your migraine attacks occur more frequently. What side effects may I notice from receiving this medicine? Side effects that you should report to your doctor or health care professional as soon as possible:  allergic reactions like skin rash, itching or hives, swelling of the face, lips, or tongue  changes in vision  chest pain or chest tightness  signs and symptoms of a dangerous change in heartbeat or heart rhythm like chest pain; dizziness; fast, irregular heartbeat; palpitations; feeling faint or lightheaded; falls; breathing problems  signs and symptoms of a stroke like changes in vision; confusion; trouble speaking or understanding; severe headaches; sudden numbness or weakness of the face, arm or leg; trouble walking; dizziness; loss of balance or coordination  signs and symptoms of serotonin syndrome like irritable; confusion; diarrhea; fast or irregular heartbeat; muscle twitching; stiff muscles; trouble walking; sweating; high fever; seizures; chills; vomiting Side effects that usually do not require medical attention (report to your doctor or health care professional if they continue or are bothersome):  diarrhea  dizziness  drowsiness  dry mouth  headache  nausea, vomiting  pain, tingling, numbness in the hands or feet  stomach pain This list may not describe all possible side effects. Call your doctor for medical advice about side effects. You may report side effects to FDA at 1-800-FDA-1088. Where should I keep my medicine? Keep out of the reach of children. Store at room temperature between 20 and 25 degrees C (68 and 77 degrees F). Protect from light and moisture. Throw away any unused medicine after the expiration date. NOTE: This sheet is a summary. It may not cover all possible information. If you have questions about this medicine, talk to your doctor, pharmacist, or health care provider.  2020 Elsevier/Gold Standard  (2018-02-14 15:28:21)

## 2019-11-15 NOTE — Progress Notes (Signed)
Toradol 60 mg IM injection administered in LUOQ of L buttock per v.o. Dr. Lucia Gaskins. Pt tolerated well. No bleeding. Aseptic technique used. Bandaid applied.

## 2019-11-15 NOTE — Progress Notes (Signed)
[facility]    Provider:  Dr Jaynee Eagles Requesting Provider: Regis Bill Standley Brooking, MD Primary Care Provider:  Burnis Medin, MD  CC:  Migraines  Interval history 11/15/2019: She feels better, migraines cut in half from at least 8 to 4 migraine days a month. She is on extended birth control and that has helped as well. She has PCOS and this birth control helps. Nurtec helps 50% of the time. She is very happy with the Ajovy. The nurtec wokrs only a little bit, she has tried Recruitment consultant and possibly imitrex, will try zomig. May also try Tosymra or injectable imitrex. Can also try toradol.   HPI:  Catherine [name] is a 29 y.o. female here as requested by Panosh, Standley Brooking, MD for migraines sine the age of 9 since puberty. She has seen Dr. Brett Fairy. She was on multiple medications. She has tried multiple birth controls to see if this woul dhelp with her migraines. She has PCOS diagnosed at the age of 44. Migraines are very hormonal. She gets migraines a few days after starting her period. Over the last 6 months she has been having them all throughout the month not just at her period. 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 She tries to use Tylenol, doesn't help. She has sensitivity to light and migraine location varies between sides or both, she has migraines without aura and also with aura - the  aura is swirling lights and blocks but not all the time she also has migraines without aura. Endorses photophobia, phonophbia, pounding, throbbing,nausea,vomiting. A dark cool room helps, moving makes it work, smells may trigger. Have lasted 24-48 hours. She has daily headaches. She has easily 8 migraine days a month and they moderately severe to severe and interfering to her life ongoing over 6 months. No other focal neurologic deficits, associated symptoms, inciting events or modifiable factors.   meds tried: Lamictal, paxil, Topamax(gave her gout and kidney stones),  amitriptyline, verapamil, tried other medications as well, maxalt, imitrex(side effects),   Reviewed notes, labs and imaging from outside physicians, which showed:  TSH/T4 Free 08/16/2017 normal  Reviewed notes from Dr. Regis Bill, she has had migraines since a teen, mostly migraines during menstrual cycle but worsening over the years, more frequent. She has a remote hx of neurologic care, requested neurology, past meds not helping, kidney stones with topamax. 10 hours of sleep a night, 5 cats. 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 On Lamotrigine for psychiatric d/o.Reviewed examination, normal gen,heent,neck,lungs,CV,psych,musculoskeletal  I will request notes from [facility] where she was seen in the past.  Review of Systems: Patient complains of symptoms per HPI as well as the following symptoms: headache, anxiety, bipolar. Pertinent negatives and positives per HPI. All others negative.   Social History   Socioeconomic History  . Marital status: Single    Spouse name: Not on file  . Number of children: 0  . Years of education: Not on file  . Highest education level: Not on file  Occupational History  . 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 Smoking status: Never Smoker  . Smokeless tobacco: Never Used  Substance and Sexual Activity  . Alcohol use: Yes    Comment: maybe 3 drinks a month   . Drug use: No  . Sexual activity: Yes    Birth control/protection: Pill  Other Topics Concern  . Not on file  Social History Narrative   Lives by herself with 5 cats near [facility] [address] apartment. 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 Will be difficult to go back financially.      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 Difficulty of Paying Living Expenses:   Food Insecurity:   . Worried About Programme researcher, broadcasting/film/video in the Last Year:   . Barista in the Last Year:   Transportation Needs:   . Freight forwarder (Medical):   [name] Lack of Transportation (Non-Medical):   Physical Activity:   . Days of Exercise per Week:   . Minutes of Exercise per Session:   Stress:   . Feeling of Stress :   Social Connections:   . Frequency of Communication with Friends and Family:   . Frequency of Social Gatherings with Friends and Family:   . Attends Religious Services:   . Active Member of Clubs or Organizations:   . Attends Banker Meetings:   [name] Marital Status:   Intimate Partner Violence:   . Fear of Current or Ex-Partner:   . Emotionally Abused:   [name] Physically Abused:   . Sexually Abused:     Family History  Problem Relation Age of Onset  . Breast cancer Maternal Grandmother   . Colon cancer Maternal Grandmother   . Colon polyps Maternal Grandmother   . Cancer Maternal Grandmother   . Diabetes Paternal Grandfather   . Lupus Paternal Grandmother   . Thyroid disease Paternal Grandmother        Hypothyroid without surgery or irradiation.  . Osteopenia Mother   . Obesity Mother   . Migraines Father        "severe atypical"  . Migraines Sister     Past Medical History:  Diagnosis Date  . Anxiety    Counseling and psychiatry  . Chronic headaches    Sees specialist  . Dyspepsia   . Fatigue   . Goiter   . Hashimoto's disease   . Hashimoto's thyroiditis   . History of lower GI bleeding   . History of recurrent UTIs    Sees urologist  . History of shingles    face right   . Hx of varicella   . Hypothyroidism, acquired, autoimmune   . IBS (irritable bowel syndrome)   . Obesity   . Oligomenorrhea   . PCOS (polycystic ovarian syndrome)   . PUD (peptic  ulcer disease)   . Thyroid disease     Patient Active Problem List   Diagnosis Date Noted  . Chronic migraine without aura without status migrainosus, not intractable 05/20/2019  . Migraine with aura and without status migrainosus, not intractable 05/20/2019  . Bipolar 1 disorder, mixed, moderate (HCC) 06/16/2016  . Mouth ulcer 05/31/2014  . Left facial pain 05/31/2014  . Herpes simplex labialis 04/17/2013  . Vaginal lesions 10/06/2012  . Gout attack possible.  09/05/2012  . Foot swelling 09/05/2012  . Hx of urinary stone 09/05/2012  . Dermatitis 09/03/2012  . Bright red rectal bleeding 09/01/2012  . Diarrhea 09/01/2012  . Adjustment disorder  with anxiety 05/31/2012  . Menstrual migraine 05/31/2012  . Vitamin D deficiency 05/31/2012  . Hx of fracture of leg 05/31/2012  . History of recurrent UTIs   . Oligomenorrhea   . Goiter   . Hashimoto's thyroiditis   . Dyspepsia   . Hypothyroidism, acquired, autoimmune   . Fatigue   . IBS (irritable bowel syndrome) 05/11/2011  . PCOS (polycystic ovarian syndrome) 12/31/2010  . Obesity 12/31/2010    Past Surgical History:  Procedure Laterality Date  . BAND HEMORRHOIDECTOMY    . KNEE SURGERY     x 2 femoral.  break  sp fall  7th grade   . knees    . WISDOM TOOTH EXTRACTION      Current Outpatient Medications  Medication Sig Dispense Refill  . acetaminophen (TYLENOL) 650 MG CR tablet Take 650-1,300 mg by mouth every 8 (eight) hours as needed for pain.    [name] ALPRAZolam (XANAX XR) 0.5 MG 24 hr tablet Take 0.5 mg by mouth 2 (two) times daily as needed for anxiety.     . Calcium Carbonate-Vitamin D (CALCIUM 500 + D PO) Take 1 tablet by mouth daily after breakfast.     . cetirizine (ZYRTEC) 10 MG tablet Take 10 mg by mouth daily.      . diclofenac sodium (VOLTAREN) 1 % GEL Apply 4 g topically 4 (four) times daily as needed. 200 g 1  . EPINEPHrine (EPIPEN 2-PAK) 0.3 mg/0.3 mL IJ SOAJ injection INJECT 1 SYRINGE INTO THE MUSCLE AS NEEDED 1  each 1  . Fremanezumab-vfrm (AJOVY) 225 MG/1.5ML SOAJ Inject 225 mg into the skin every 30 (thirty) days. 1 pen 11  . lamoTRIgine (LAMICTAL) 150 MG tablet Take 300 mg by mouth daily.    . metFORMIN (GLUCOPHAGE) 500 MG tablet Take 500 mg by mouth daily with breakfast.    . Multiple Vitamin (MULTI-VITAMIN PO) Take 1 tablet by mouth daily after breakfast.     . Norethin-Eth Estradiol-Fe (WYMZYA FE) 0.4-35 MG-MCG tablet Chew 1 tablet by mouth daily.     . Probiotic Product (PROBIOTIC PO) Take 1 capsule by mouth every morning.     . Rimegepant Sulfate (NURTEC) 75 MG TBDP Take 75 mg by mouth daily as needed. For migraines. Take as close to onset of migraine as possible. One daily maximum. 10 tablet 6  . valACYclovir (VALTREX) 1000 MG tablet TAKE 2 TABLETS BY MOUTH TWICE A DAY OR AS DIRECTED 30 tablet 3  . ondansetron (ZOFRAN-ODT) 4 MG disintegrating tablet Take 1 tablet (4 mg total) by mouth every 8 (eight) hours as needed for nausea. May take for migraine as well. 30 tablet 3  . zolmitriptan (ZOMIG ZMT) 5 MG disintegrating tablet Take 1 tablet (5 mg total) by mouth as needed for migraine. May repeat once in 2 hours. Maximum 2x in one day. 10 tablet 11   No current facility-administered medications for this visit.    Allergies as of 11/15/2019 - Review Complete 11/15/2019  Allergen Reaction Noted  . Ibuprofen Other (See Comments) 03/01/2018  . Milk-related compounds Anaphylaxis 01/20/2013  . Nsaids Other (See Comments) 03/29/2011  . Onion Diarrhea and Nausea Only 01/12/2016  . Penicillins Other (See Comments) 07/24/2011    Vitals: BP 111/80 (BP Location: Left Arm, Patient Position: Sitting)   Pulse 72   Temp (!) 97.1 F (36.2 C) Comment: taken at front  Ht 5\' 5"  (1.651 m)   Wt 164 lb (74.4 kg)   BMI 27.29 kg/m  Last Weight:  Wt  Readings from Last 1 Encounters:  11/15/19 164 lb (74.4 kg)   Last Height:   Ht Readings from Last 1 Encounters:  11/15/19 5\' 5"  (1.651 m)     Physical  exam: Exam: Gen: NAD, conversant, well nourised, well groomed                     CV: RRR, no MRG. No Carotid Bruits. No peripheral edema, warm, nontender Eyes: Conjunctivae clear without exudates or hemorrhage  Neuro: Detailed Neurologic Exam  Speech:    Speech is normal; fluent and spontaneous with normal comprehension.  Cognition:    The patient is oriented to person, place, and time;     recent and remote memory intact;     language fluent;     normal attention, concentration,     fund of knowledge Cranial Nerves:    The pupils are equal, round, and reactive to light. The fundi are normal and spontaneous venous pulsations are present. Visual fields are full to finger confrontation. Extraocular movements are intact. Trigeminal sensation is intact and the muscles of mastication are normal. The face is symmetric. The palate elevates in the midline. Hearing intact. Voice is normal. Shoulder shrug is normal. The tongue has normal motion without fasciculations.   Coordination:    No dysmetria  Gait:    Normal native gait  Motor Observation:    No asymmetry, no atrophy, and no involuntary movements noted. Tone:    Normal muscle tone.    Posture:    Posture is normal. normal erect    Strength:    Strength is V/V in the upper and lower limbs.      Sensation: intact to LT     Reflex Exam:  DTR's:    Deep tendon reflexes in the upper and lower extremities are normal bilaterally.   Toes:    The toes are downgoing bilaterally.   Clonus:    Clonus is absent.    Assessment/Plan:  29 year old with Chronic Migraines without and with aura failed multiple classes of first-lime migraine preventative medications. meds tried: Lamictal, paxil, Topamax(gave her gout and kidney stones), amitriptyline, verapamil, tried other medications as well. Discussed options for acute and preventative treatments, oral medications, injections, botox.   35 working great, discussed more acute  management she had reaction to some triptans in the past but wil try others plus zofran and in combination  Try Zomig - triptan Try Zofran for nausea and migraine May take all above with Nurtec at once at onset of migraine with ibuprofen or tylenol  - Continue Ajovy monthly for prevention - Do not get pregnant on these medications due to risk of birth defects, use 2 forms of contraception - Exam normal, no changes in bowel/badder, no weakness, no new vision changes or sensory changes, no red flags, no changes in quality or severity of migraines, not exertional or positional. Can hold off on MRI brain at this time but if headaches continue I do think it would be prudent to order imaging of brain, discussed.   Discussed: There is increased risk for stroke in women with migraine with aura and a contraindication for the combined contraceptive pill for use by women who have migraine with aura. The risk for women with migraine without aura is lower. However other risk factors like smoking are far more likely to increase stroke risk than migraine. There is a recommendation for no smoking and for the use of OCPs without estrogen such as  progestogen only pills particularly for women with migraine with aura.[name]. People who have migraine headaches with auras may be 3 times more likely to have a stroke caused by a blood clot, compared to migraine patients who don't see auras. Women who take hormone-replacement therapy may be 30 percent more likely to suffer a clot-based stroke than women not taking medication containing estrogen. Other risk factors like smoking and high blood pressure may be  much more important.  Discussed: To prevent or relieve headaches, try the following: Cool Compress. Lie down and place a cool compress on your head.  Avoid headache triggers. If certain foods or odors seem to have triggered your migraines in the past, avoid them. A headache diary might help you identify triggers.  Include  physical activity in your daily routine. Try a daily walk or other moderate aerobic exercise.  Manage stress. Find healthy ways to cope with the stressors, such as delegating tasks on your to-do list.  Practice relaxation techniques. Try deep breathing, yoga, massage and visualization.  Eat regularly. Eating regularly scheduled meals and maintaining a healthy diet might help prevent headaches. Also, drink plenty of fluids.  Follow a regular sleep schedule. Sleep deprivation might contribute to headaches Consider biofeedback. With this mind-body technique, you learn to control certain bodily functions -- such as muscle tension, heart rate and blood pressure -- to prevent headaches or reduce headache pain.    Proceed to emergency room if you experience new or worsening symptoms or symptoms do not resolve, if you have new neurologic symptoms or if headache is severe, or for any concerning symptom.   Provided education and documentation from American headache Society toolbox including articles on: chronic migraine medication overuse headache, chronic migraines, prevention of migraines, behavioral and other nonpharmacologic treatments for headache.  Meds ordered this encounter  Medications  . zolmitriptan (ZOMIG ZMT) 5 MG disintegrating tablet    Sig: Take 1 tablet (5 mg total) by mouth as needed for migraine. May repeat once in 2 hours. Maximum 2x in one day.    Dispense:  10 tablet    Refill:  11  . ondansetron (ZOFRAN-ODT) 4 MG disintegrating tablet    Sig: Take 1 tablet (4 mg total) by mouth every 8 (eight) hours as needed for nausea. May take for migraine as well.    Dispense:  30 tablet    Refill:  3    Cc: Panosh, Neta MendsWanda K, MD  Catherine DeanAntonia Gwendolynn Merkey, MD  [facility] [address] [place], [place] [id number]  Phone [phone] Fax [phone]  I spent 25 minutes of face-to-face and non-face-to-face time with patient on the  1. Chronic migraine without aura  without status migrainosus, not intractable    diagnosis.  This included previsit chart review, lab review, study review, order entry, electronic health record documentation, patient education on the different diagnostic and therapeutic options, counseling and coordination of care, risks and benefits of management, compliance, or risk factor reduction

## 2019-11-19 DIAGNOSIS — F411 Generalized anxiety disorder: Secondary | ICD-10-CM | POA: Diagnosis not present

## 2019-11-19 DIAGNOSIS — F3132 Bipolar disorder, current episode depressed, moderate: Secondary | ICD-10-CM | POA: Diagnosis not present

## 2019-11-19 DIAGNOSIS — F4312 Post-traumatic stress disorder, chronic: Secondary | ICD-10-CM | POA: Diagnosis not present

## 2019-11-22 DIAGNOSIS — F4312 Post-traumatic stress disorder, chronic: Secondary | ICD-10-CM | POA: Diagnosis not present

## 2019-11-22 DIAGNOSIS — F3132 Bipolar disorder, current episode depressed, moderate: Secondary | ICD-10-CM | POA: Diagnosis not present

## 2019-11-22 DIAGNOSIS — F411 Generalized anxiety disorder: Secondary | ICD-10-CM | POA: Diagnosis not present

## 2019-11-24 IMAGING — DX DG CHEST 2V
2 series · 2 of 2 positions shown · non-contrast
Comparison: None.

CLINICAL DATA: Chest pain cough congestion

EXAM:
CHEST - 2 VIEW

[chest pa]
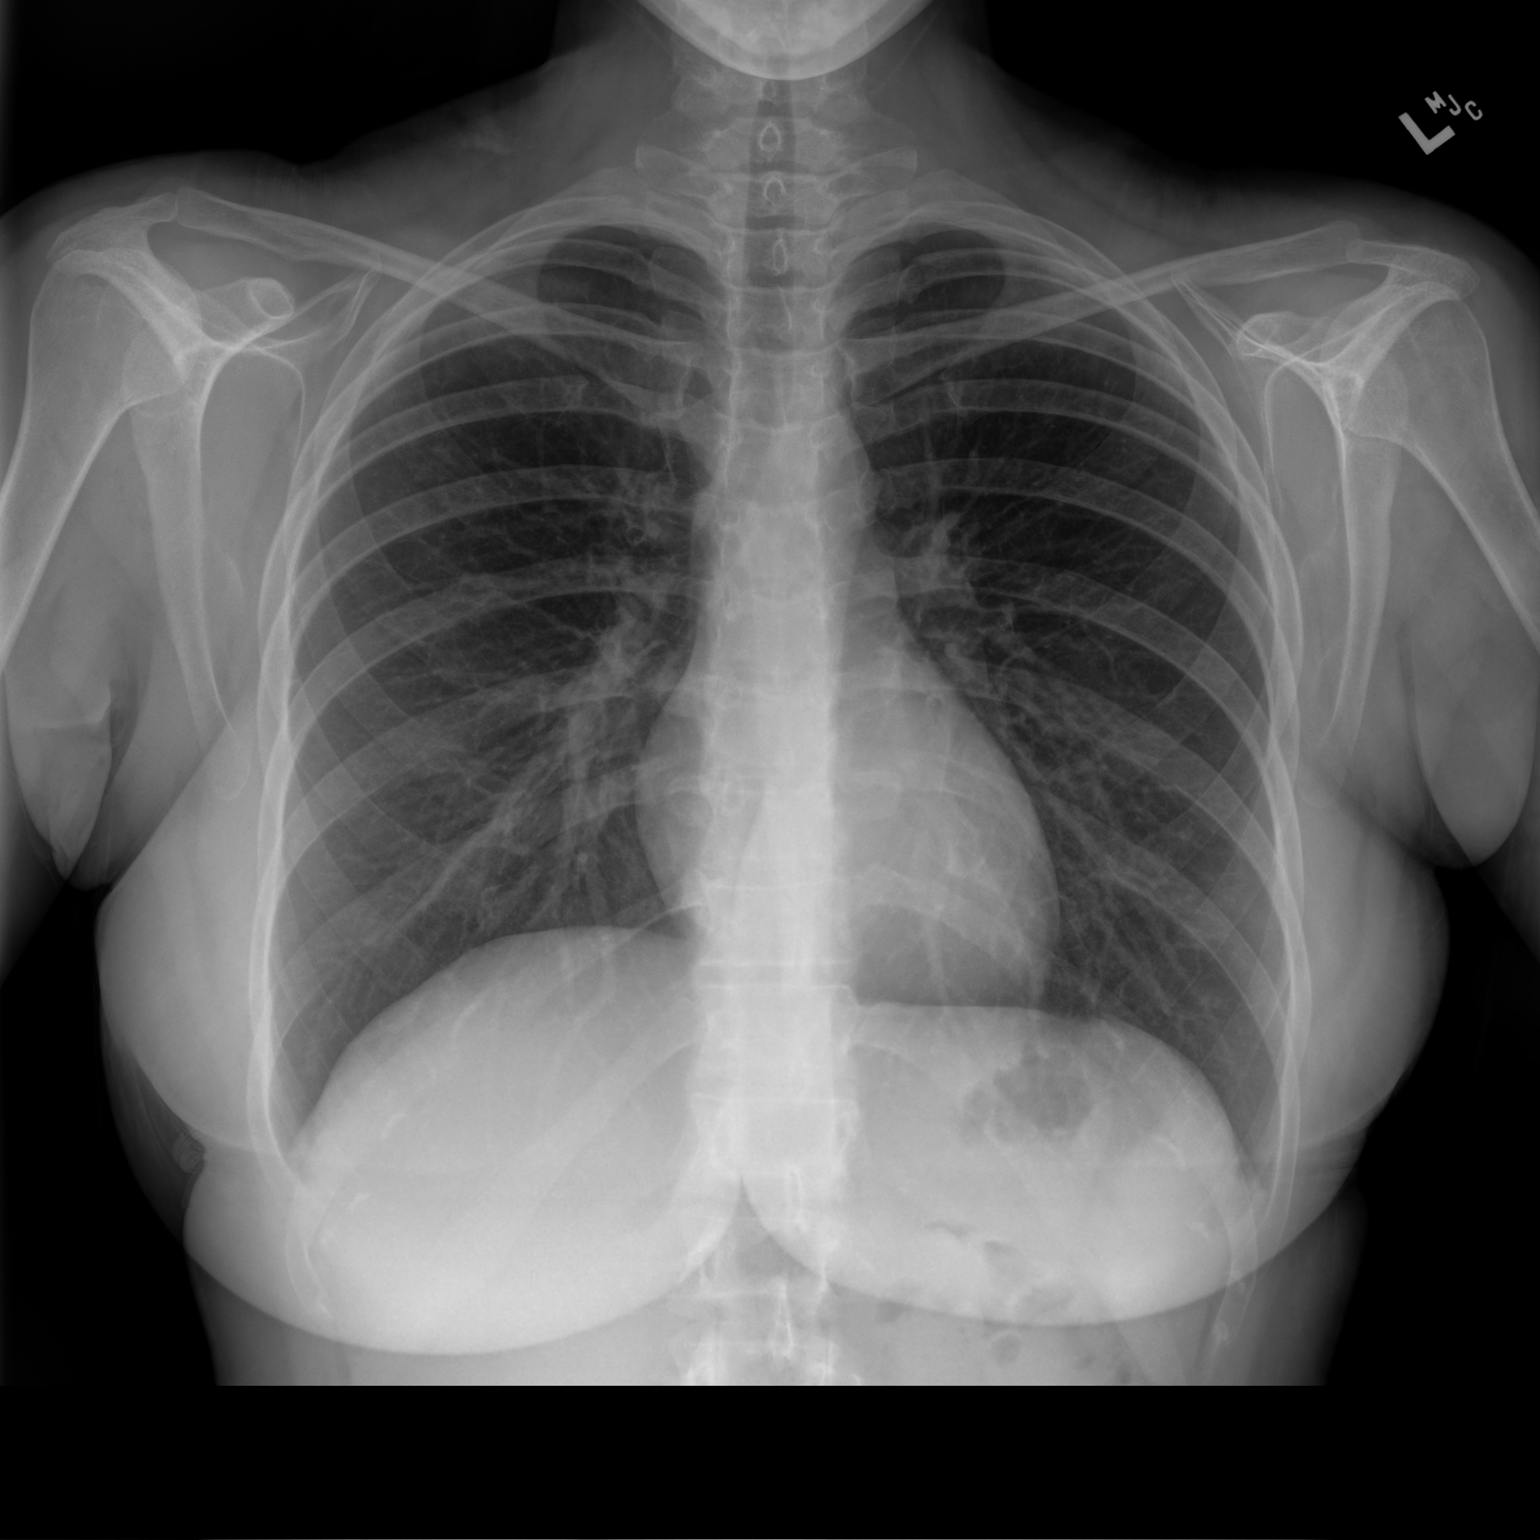

[chest lat]
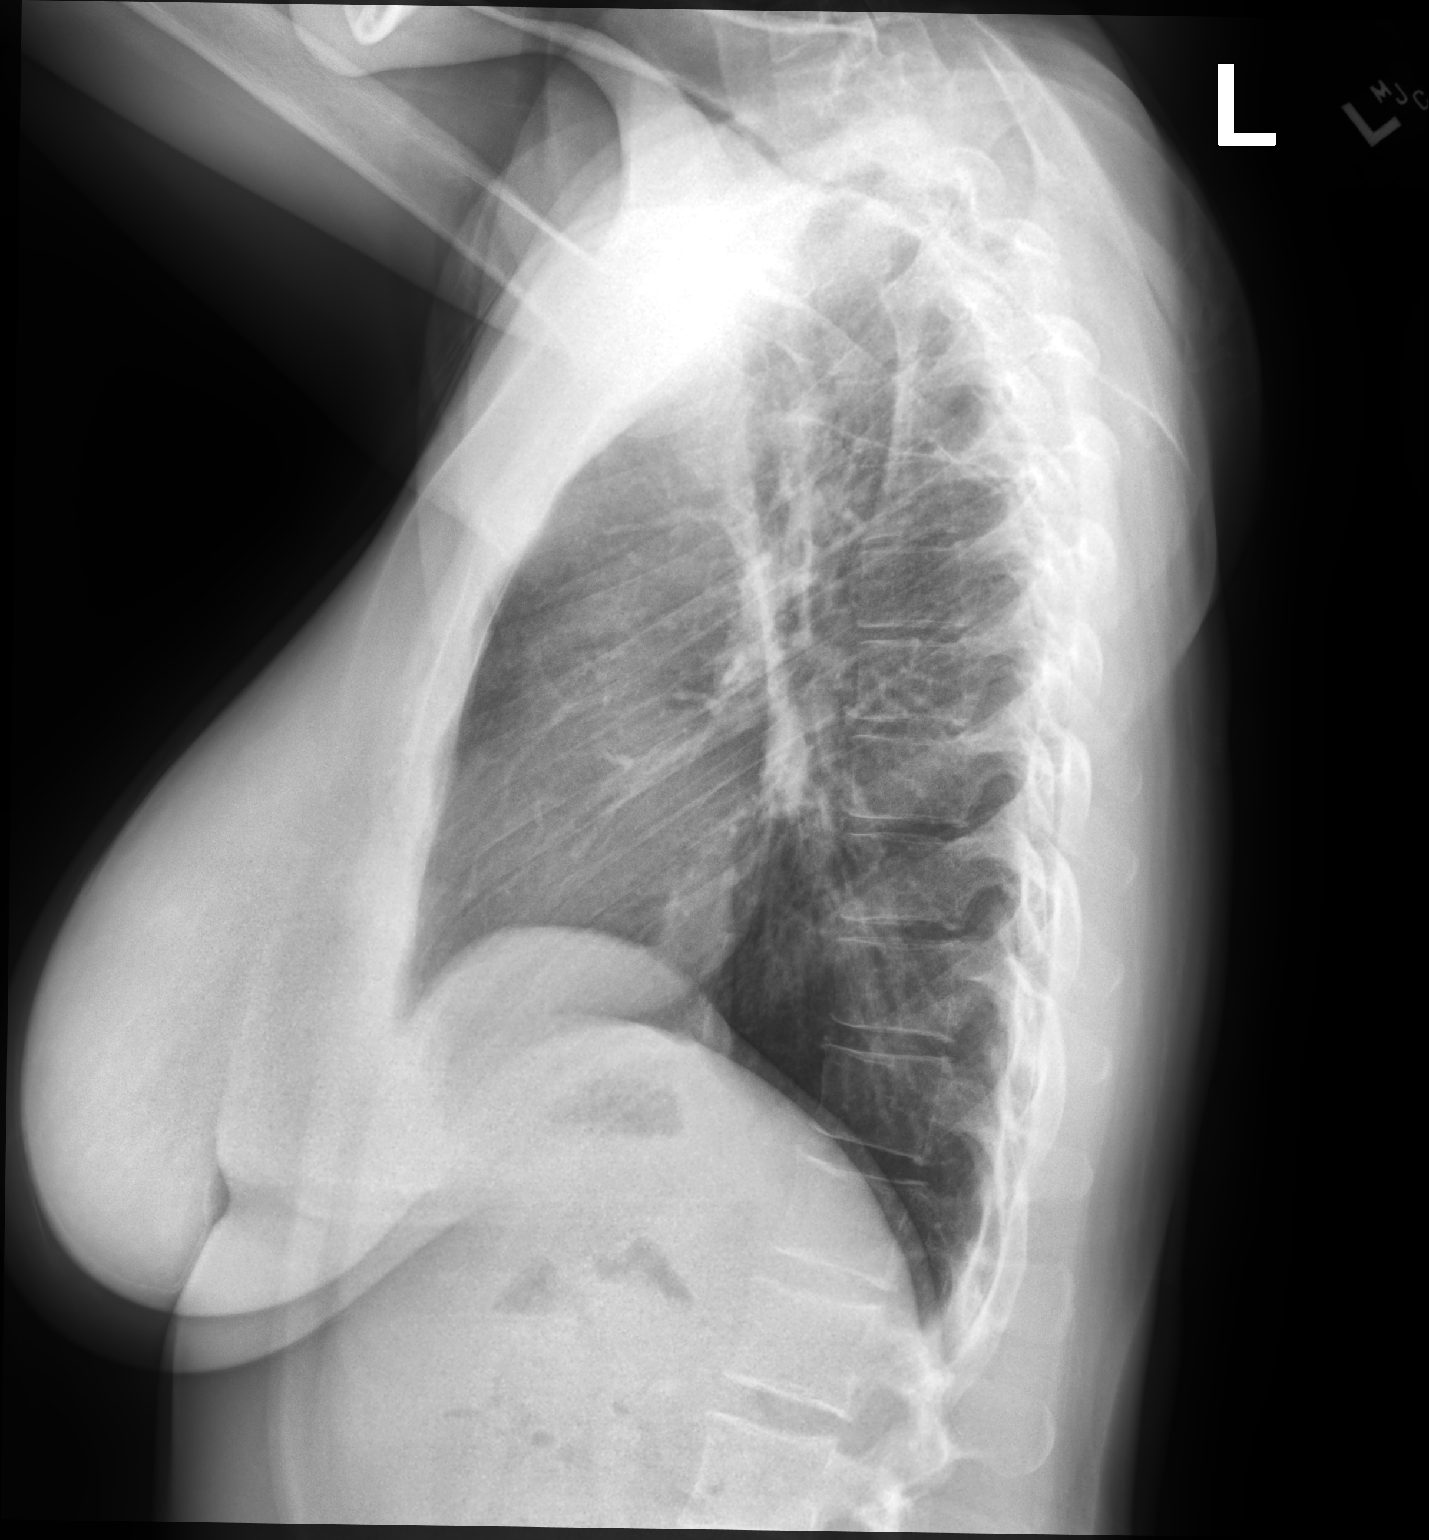

[2 of 2 positions shown; findings below may reference images not displayed]

FINDINGS: The heart size and mediastinal contours are within normal limits.
Both lungs are clear. The visualized skeletal structures are
unremarkable.
IMPRESSION: No active cardiopulmonary disease.

## 2019-11-26 DIAGNOSIS — F3132 Bipolar disorder, current episode depressed, moderate: Secondary | ICD-10-CM | POA: Diagnosis not present

## 2019-11-26 DIAGNOSIS — F4312 Post-traumatic stress disorder, chronic: Secondary | ICD-10-CM | POA: Diagnosis not present

## 2019-11-26 DIAGNOSIS — F411 Generalized anxiety disorder: Secondary | ICD-10-CM | POA: Diagnosis not present

## 2019-11-28 DIAGNOSIS — M1711 Unilateral primary osteoarthritis, right knee: Secondary | ICD-10-CM | POA: Diagnosis not present

## 2019-12-03 DIAGNOSIS — F4312 Post-traumatic stress disorder, chronic: Secondary | ICD-10-CM | POA: Diagnosis not present

## 2019-12-03 DIAGNOSIS — F411 Generalized anxiety disorder: Secondary | ICD-10-CM | POA: Diagnosis not present

## 2019-12-03 DIAGNOSIS — F3132 Bipolar disorder, current episode depressed, moderate: Secondary | ICD-10-CM | POA: Diagnosis not present

## 2019-12-05 DIAGNOSIS — M1711 Unilateral primary osteoarthritis, right knee: Secondary | ICD-10-CM | POA: Diagnosis not present

## 2019-12-10 DIAGNOSIS — F411 Generalized anxiety disorder: Secondary | ICD-10-CM | POA: Diagnosis not present

## 2019-12-10 DIAGNOSIS — F3132 Bipolar disorder, current episode depressed, moderate: Secondary | ICD-10-CM | POA: Diagnosis not present

## 2019-12-10 DIAGNOSIS — F4312 Post-traumatic stress disorder, chronic: Secondary | ICD-10-CM | POA: Diagnosis not present

## 2019-12-12 DIAGNOSIS — M1711 Unilateral primary osteoarthritis, right knee: Secondary | ICD-10-CM | POA: Diagnosis not present

## 2019-12-13 DIAGNOSIS — F3132 Bipolar disorder, current episode depressed, moderate: Secondary | ICD-10-CM | POA: Diagnosis not present

## 2019-12-13 DIAGNOSIS — F4312 Post-traumatic stress disorder, chronic: Secondary | ICD-10-CM | POA: Diagnosis not present

## 2019-12-13 DIAGNOSIS — F411 Generalized anxiety disorder: Secondary | ICD-10-CM | POA: Diagnosis not present

## 2019-12-17 DIAGNOSIS — F3132 Bipolar disorder, current episode depressed, moderate: Secondary | ICD-10-CM | POA: Diagnosis not present

## 2019-12-17 DIAGNOSIS — F4312 Post-traumatic stress disorder, chronic: Secondary | ICD-10-CM | POA: Diagnosis not present

## 2019-12-17 DIAGNOSIS — F411 Generalized anxiety disorder: Secondary | ICD-10-CM | POA: Diagnosis not present

## 2019-12-20 DIAGNOSIS — F3132 Bipolar disorder, current episode depressed, moderate: Secondary | ICD-10-CM | POA: Diagnosis not present

## 2019-12-20 DIAGNOSIS — F4312 Post-traumatic stress disorder, chronic: Secondary | ICD-10-CM | POA: Diagnosis not present

## 2019-12-20 DIAGNOSIS — F411 Generalized anxiety disorder: Secondary | ICD-10-CM | POA: Diagnosis not present

## 2019-12-24 DIAGNOSIS — F3132 Bipolar disorder, current episode depressed, moderate: Secondary | ICD-10-CM | POA: Diagnosis not present

## 2019-12-24 DIAGNOSIS — F4312 Post-traumatic stress disorder, chronic: Secondary | ICD-10-CM | POA: Diagnosis not present

## 2019-12-24 DIAGNOSIS — F411 Generalized anxiety disorder: Secondary | ICD-10-CM | POA: Diagnosis not present

## 2019-12-27 DIAGNOSIS — F3132 Bipolar disorder, current episode depressed, moderate: Secondary | ICD-10-CM | POA: Diagnosis not present

## 2019-12-27 DIAGNOSIS — F411 Generalized anxiety disorder: Secondary | ICD-10-CM | POA: Diagnosis not present

## 2019-12-27 DIAGNOSIS — F4312 Post-traumatic stress disorder, chronic: Secondary | ICD-10-CM | POA: Diagnosis not present

## 2019-12-31 DIAGNOSIS — F4312 Post-traumatic stress disorder, chronic: Secondary | ICD-10-CM | POA: Diagnosis not present

## 2019-12-31 DIAGNOSIS — F411 Generalized anxiety disorder: Secondary | ICD-10-CM | POA: Diagnosis not present

## 2019-12-31 DIAGNOSIS — F3132 Bipolar disorder, current episode depressed, moderate: Secondary | ICD-10-CM | POA: Diagnosis not present

## 2020-01-03 DIAGNOSIS — F411 Generalized anxiety disorder: Secondary | ICD-10-CM | POA: Diagnosis not present

## 2020-01-03 DIAGNOSIS — F3132 Bipolar disorder, current episode depressed, moderate: Secondary | ICD-10-CM | POA: Diagnosis not present

## 2020-01-03 DIAGNOSIS — F4312 Post-traumatic stress disorder, chronic: Secondary | ICD-10-CM | POA: Diagnosis not present

## 2020-01-07 DIAGNOSIS — F411 Generalized anxiety disorder: Secondary | ICD-10-CM | POA: Diagnosis not present

## 2020-01-07 DIAGNOSIS — F3132 Bipolar disorder, current episode depressed, moderate: Secondary | ICD-10-CM | POA: Diagnosis not present

## 2020-01-07 DIAGNOSIS — F4312 Post-traumatic stress disorder, chronic: Secondary | ICD-10-CM | POA: Diagnosis not present

## 2020-01-10 DIAGNOSIS — F319 Bipolar disorder, unspecified: Secondary | ICD-10-CM | POA: Diagnosis not present

## 2020-01-10 DIAGNOSIS — F411 Generalized anxiety disorder: Secondary | ICD-10-CM | POA: Diagnosis not present

## 2020-01-14 DIAGNOSIS — F411 Generalized anxiety disorder: Secondary | ICD-10-CM | POA: Diagnosis not present

## 2020-01-14 DIAGNOSIS — F3132 Bipolar disorder, current episode depressed, moderate: Secondary | ICD-10-CM | POA: Diagnosis not present

## 2020-01-14 DIAGNOSIS — F4312 Post-traumatic stress disorder, chronic: Secondary | ICD-10-CM | POA: Diagnosis not present

## 2020-02-04 DIAGNOSIS — F4312 Post-traumatic stress disorder, chronic: Secondary | ICD-10-CM | POA: Diagnosis not present

## 2020-02-04 DIAGNOSIS — F411 Generalized anxiety disorder: Secondary | ICD-10-CM | POA: Diagnosis not present

## 2020-02-04 DIAGNOSIS — F3132 Bipolar disorder, current episode depressed, moderate: Secondary | ICD-10-CM | POA: Diagnosis not present

## 2020-02-07 DIAGNOSIS — F4312 Post-traumatic stress disorder, chronic: Secondary | ICD-10-CM | POA: Diagnosis not present

## 2020-02-07 DIAGNOSIS — F3132 Bipolar disorder, current episode depressed, moderate: Secondary | ICD-10-CM | POA: Diagnosis not present

## 2020-02-07 DIAGNOSIS — F411 Generalized anxiety disorder: Secondary | ICD-10-CM | POA: Diagnosis not present

## 2020-02-11 DIAGNOSIS — F4312 Post-traumatic stress disorder, chronic: Secondary | ICD-10-CM | POA: Diagnosis not present

## 2020-02-11 DIAGNOSIS — F411 Generalized anxiety disorder: Secondary | ICD-10-CM | POA: Diagnosis not present

## 2020-02-11 DIAGNOSIS — Z20828 Contact with and (suspected) exposure to other viral communicable diseases: Secondary | ICD-10-CM | POA: Diagnosis not present

## 2020-02-11 DIAGNOSIS — F3132 Bipolar disorder, current episode depressed, moderate: Secondary | ICD-10-CM | POA: Diagnosis not present

## 2020-02-14 DIAGNOSIS — F4312 Post-traumatic stress disorder, chronic: Secondary | ICD-10-CM | POA: Diagnosis not present

## 2020-02-14 DIAGNOSIS — F411 Generalized anxiety disorder: Secondary | ICD-10-CM | POA: Diagnosis not present

## 2020-02-14 DIAGNOSIS — F3132 Bipolar disorder, current episode depressed, moderate: Secondary | ICD-10-CM | POA: Diagnosis not present

## 2020-02-18 DIAGNOSIS — F3132 Bipolar disorder, current episode depressed, moderate: Secondary | ICD-10-CM | POA: Diagnosis not present

## 2020-02-18 DIAGNOSIS — F411 Generalized anxiety disorder: Secondary | ICD-10-CM | POA: Diagnosis not present

## 2020-02-18 DIAGNOSIS — F4312 Post-traumatic stress disorder, chronic: Secondary | ICD-10-CM | POA: Diagnosis not present

## 2020-02-25 DIAGNOSIS — F411 Generalized anxiety disorder: Secondary | ICD-10-CM | POA: Diagnosis not present

## 2020-02-25 DIAGNOSIS — F4312 Post-traumatic stress disorder, chronic: Secondary | ICD-10-CM | POA: Diagnosis not present

## 2020-02-25 DIAGNOSIS — F3132 Bipolar disorder, current episode depressed, moderate: Secondary | ICD-10-CM | POA: Diagnosis not present

## 2020-02-28 ENCOUNTER — Other Ambulatory Visit: Payer: Self-pay | Admitting: Internal Medicine

## 2020-02-28 DIAGNOSIS — F4312 Post-traumatic stress disorder, chronic: Secondary | ICD-10-CM | POA: Diagnosis not present

## 2020-02-28 DIAGNOSIS — F3132 Bipolar disorder, current episode depressed, moderate: Secondary | ICD-10-CM | POA: Diagnosis not present

## 2020-02-28 DIAGNOSIS — F411 Generalized anxiety disorder: Secondary | ICD-10-CM | POA: Diagnosis not present

## 2020-02-29 ENCOUNTER — Other Ambulatory Visit: Payer: Self-pay | Admitting: Internal Medicine

## 2020-03-03 DIAGNOSIS — F3132 Bipolar disorder, current episode depressed, moderate: Secondary | ICD-10-CM | POA: Diagnosis not present

## 2020-03-03 DIAGNOSIS — F4312 Post-traumatic stress disorder, chronic: Secondary | ICD-10-CM | POA: Diagnosis not present

## 2020-03-03 DIAGNOSIS — F411 Generalized anxiety disorder: Secondary | ICD-10-CM | POA: Diagnosis not present

## 2020-03-06 DIAGNOSIS — F4312 Post-traumatic stress disorder, chronic: Secondary | ICD-10-CM | POA: Diagnosis not present

## 2020-03-06 DIAGNOSIS — F3132 Bipolar disorder, current episode depressed, moderate: Secondary | ICD-10-CM | POA: Diagnosis not present

## 2020-03-06 DIAGNOSIS — F411 Generalized anxiety disorder: Secondary | ICD-10-CM | POA: Diagnosis not present

## 2020-03-10 DIAGNOSIS — F411 Generalized anxiety disorder: Secondary | ICD-10-CM | POA: Diagnosis not present

## 2020-03-10 DIAGNOSIS — F3132 Bipolar disorder, current episode depressed, moderate: Secondary | ICD-10-CM | POA: Diagnosis not present

## 2020-03-10 DIAGNOSIS — F4312 Post-traumatic stress disorder, chronic: Secondary | ICD-10-CM | POA: Diagnosis not present

## 2020-03-13 DIAGNOSIS — F4312 Post-traumatic stress disorder, chronic: Secondary | ICD-10-CM | POA: Diagnosis not present

## 2020-03-13 DIAGNOSIS — F3132 Bipolar disorder, current episode depressed, moderate: Secondary | ICD-10-CM | POA: Diagnosis not present

## 2020-03-13 DIAGNOSIS — F411 Generalized anxiety disorder: Secondary | ICD-10-CM | POA: Diagnosis not present

## 2020-03-17 DIAGNOSIS — F3132 Bipolar disorder, current episode depressed, moderate: Secondary | ICD-10-CM | POA: Diagnosis not present

## 2020-03-17 DIAGNOSIS — F411 Generalized anxiety disorder: Secondary | ICD-10-CM | POA: Diagnosis not present

## 2020-03-17 DIAGNOSIS — F4312 Post-traumatic stress disorder, chronic: Secondary | ICD-10-CM | POA: Diagnosis not present

## 2020-03-20 DIAGNOSIS — F4312 Post-traumatic stress disorder, chronic: Secondary | ICD-10-CM | POA: Diagnosis not present

## 2020-03-20 DIAGNOSIS — F411 Generalized anxiety disorder: Secondary | ICD-10-CM | POA: Diagnosis not present

## 2020-03-20 DIAGNOSIS — F3132 Bipolar disorder, current episode depressed, moderate: Secondary | ICD-10-CM | POA: Diagnosis not present

## 2020-03-20 MED ORDER — PROMETHAZINE HCL 25 MG PO TABS: ORAL_TABLET | ORAL | 0 refills | Status: DC

## 2020-03-24 DIAGNOSIS — F3132 Bipolar disorder, current episode depressed, moderate: Secondary | ICD-10-CM | POA: Diagnosis not present

## 2020-03-24 DIAGNOSIS — F411 Generalized anxiety disorder: Secondary | ICD-10-CM | POA: Diagnosis not present

## 2020-03-24 DIAGNOSIS — F4312 Post-traumatic stress disorder, chronic: Secondary | ICD-10-CM | POA: Diagnosis not present

## 2020-03-27 ENCOUNTER — Other Ambulatory Visit: Payer: Self-pay | Admitting: Internal Medicine

## 2020-03-28 ENCOUNTER — Other Ambulatory Visit: Payer: Self-pay | Admitting: Internal Medicine

## 2020-03-31 DIAGNOSIS — F4312 Post-traumatic stress disorder, chronic: Secondary | ICD-10-CM | POA: Diagnosis not present

## 2020-03-31 DIAGNOSIS — F411 Generalized anxiety disorder: Secondary | ICD-10-CM | POA: Diagnosis not present

## 2020-03-31 DIAGNOSIS — F3132 Bipolar disorder, current episode depressed, moderate: Secondary | ICD-10-CM | POA: Diagnosis not present

## 2020-04-04 ENCOUNTER — Telehealth: Payer: Self-pay | Admitting: Neurology

## 2020-04-04 NOTE — Telephone Encounter (Signed)
Pt called would like to leave a message for the nurse to call her back on Monday. [employer] request PA for Rimegepant Sulfate (NURTEC) 75 MG TBDP. BCBS require Pt to complete 2 Triptan. Pt  have tried Maxalt but had intense jaw pain, did not work.

## 2020-04-07 DIAGNOSIS — F3132 Bipolar disorder, current episode depressed, moderate: Secondary | ICD-10-CM | POA: Diagnosis not present

## 2020-04-07 DIAGNOSIS — F4312 Post-traumatic stress disorder, chronic: Secondary | ICD-10-CM | POA: Diagnosis not present

## 2020-04-07 DIAGNOSIS — F411 Generalized anxiety disorder: Secondary | ICD-10-CM | POA: Diagnosis not present

## 2020-04-07 NOTE — Telephone Encounter (Signed)
Completed a Nurtec PA on Cover My meds. Key: [id number]. Awaiting determination from [employer].

## 2020-04-10 ENCOUNTER — Telehealth: Payer: Self-pay | Admitting: *Deleted

## 2020-04-10 ENCOUNTER — Other Ambulatory Visit: Payer: Self-pay | Admitting: Neurology

## 2020-04-10 MED ORDER — UBRELVY 100 MG PO TABS
100.0000 mg | ORAL_TABLET | ORAL | 6 refills | Status: DC | PRN
Start: 2020-04-10 — End: 2020-05-21

## 2020-04-10 NOTE — Addendum Note (Signed)
Addended by: Bertram Savin on: 04/10/2020 05:39 PM   Modules accepted: Orders

## 2020-04-10 NOTE — Telephone Encounter (Signed)
Bernita Raisin PA completed on Cover My Meds. Key: [id number]. Awaiting determination from [employer].

## 2020-04-10 NOTE — Telephone Encounter (Signed)
Received determination from [employer]. Nurtec has been denied because Catherine [name] has not been tried first.   If appeal needed, the patient has to consent for provider to appeal. Consent located on [email or web address] and search "member appeal representation". The appeal should be faxed within 180 days to [phone].

## 2020-04-14 DIAGNOSIS — F411 Generalized anxiety disorder: Secondary | ICD-10-CM | POA: Diagnosis not present

## 2020-04-14 DIAGNOSIS — F4312 Post-traumatic stress disorder, chronic: Secondary | ICD-10-CM | POA: Diagnosis not present

## 2020-04-14 DIAGNOSIS — F3132 Bipolar disorder, current episode depressed, moderate: Secondary | ICD-10-CM | POA: Diagnosis not present

## 2020-04-16 ENCOUNTER — Other Ambulatory Visit: Payer: Self-pay | Admitting: Neurology

## 2020-04-21 DIAGNOSIS — F411 Generalized anxiety disorder: Secondary | ICD-10-CM | POA: Diagnosis not present

## 2020-04-21 DIAGNOSIS — F4312 Post-traumatic stress disorder, chronic: Secondary | ICD-10-CM | POA: Diagnosis not present

## 2020-04-21 DIAGNOSIS — F3132 Bipolar disorder, current episode depressed, moderate: Secondary | ICD-10-CM | POA: Diagnosis not present

## 2020-04-27 DIAGNOSIS — Z20828 Contact with and (suspected) exposure to other viral communicable diseases: Secondary | ICD-10-CM | POA: Diagnosis not present

## 2020-05-13 DIAGNOSIS — F3132 Bipolar disorder, current episode depressed, moderate: Secondary | ICD-10-CM | POA: Diagnosis not present

## 2020-05-13 DIAGNOSIS — F411 Generalized anxiety disorder: Secondary | ICD-10-CM | POA: Diagnosis not present

## 2020-05-13 DIAGNOSIS — F4312 Post-traumatic stress disorder, chronic: Secondary | ICD-10-CM | POA: Diagnosis not present

## 2020-05-19 DIAGNOSIS — F4312 Post-traumatic stress disorder, chronic: Secondary | ICD-10-CM | POA: Diagnosis not present

## 2020-05-19 DIAGNOSIS — F411 Generalized anxiety disorder: Secondary | ICD-10-CM | POA: Diagnosis not present

## 2020-05-19 DIAGNOSIS — F3132 Bipolar disorder, current episode depressed, moderate: Secondary | ICD-10-CM | POA: Diagnosis not present

## 2020-05-20 NOTE — Progress Notes (Signed)
[facility]    Provider:  Dr Lucia Gaskins Requesting Provider: Fabian Sharp [name], MD Primary Care Provider:  Madelin Headings, MD  CC:  Migraines  Interval history 05/21/2020: Catherine [name] is still working great. Catherine [name] works fine. She could not get nurtec approved. Nurtec didn't cause tiredness but Ubrelvy did. The triptan had side effects, she has had side effects to multiple triptans.   Interval history 11/15/2019: She feels better, migraines cut in half from at least 8 to 4 migraine days a month. She is on extended birth control and that has helped as well. She has PCOS and this birth control helps. Nurtec helps 50% of the time. She is very happy with the Ajovy. The nurtec wokrs only a little bit, she has tried Scientist, clinical (histocompatibility and immunogenetics) and possibly imitrex, will try zomig. May also try Tosymra or injectable imitrex. Can also try toradol.   HPI:  Catherine [name] is a 29 y.o. female here as requested by Panosh, [name], MD for migraines sine the age of 73 since puberty. She has seen Dr. Vickey Huger. She was on multiple medications. She has tried multiple birth controls to see if this woul dhelp with her migraines. She has PCOS diagnosed at the age of 61. Migraines are very hormonal. She gets migraines a few days after starting her period. Over the last 6 months she has been having them all throughout the month not just at her period. 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 She tries to use Tylenol, doesn't help. She has sensitivity to light and migraine location varies between sides or both, she has migraines without aura and also with aura - the  aura is swirling lights and blocks but not all the time she also has migraines without aura. Endorses photophobia, phonophbia, pounding, throbbing,nausea,vomiting. A dark cool room helps, moving makes it work, smells may trigger. Have lasted 24-48 hours. She has daily headaches. She has easily 8 migraine days a month and they moderately  severe to severe and interfering to her life ongoing over 6 months. No other focal neurologic deficits, associated symptoms, inciting events or modifiable factors.   meds tried: Lamictal, paxil, Topamax(gave her gout and kidney stones), amitriptyline, verapamil, tried other medications as well, maxalt, imitrex(side effects),   Reviewed notes, labs and imaging from outside physicians, which showed:  TSH/T4 Free 08/16/2017 normal  Reviewed notes from Dr. Fabian Sharp, she has had migraines since a teen, mostly migraines during menstrual cycle but worsening over the years, more frequent. She has a remote hx of neurologic care, requested neurology, past meds not helping, kidney stones with topamax. 10 hours of sleep a night, 5 cats. 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 On Lamotrigine for psychiatric d/o.Reviewed examination, normal gen,heent,neck,lungs,CV,psych,musculoskeletal  I will request notes from [facility] where she was seen in the past.  Review of Systems: Patient complains of symptoms per HPI as well as the following symptoms: anxiety . Pertinent negatives and positives per HPI. All others negative    Social History   Socioeconomic History  . Marital status: Single    Spouse name: Not on file  . Number of children: 0  . Years of education: Not on file  . Highest education level: Not on file  Occupational History  . 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 Smoking status: Never Smoker  . Smokeless tobacco: Never Used  Vaping Use  . Vaping Use:  Never used  Substance and Sexual Activity  . Alcohol use: Yes    Comment: maybe 3 drinks a month   . Drug use: No  . Sexual activity: Yes    Birth control/protection: Pill  Other Topics Concern  . Not on file  Social History Narrative   Lives by herself with 5 cats near [address] apartment. Household to 3 at home 10 hours of sleep   Negative alcohol tobacco some caffeine  occasionally   Fifth year senior [employer]. 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 Will be difficult to go back financially.      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 Difficulty of Paying Living Expenses: Not on file  Food Insecurity:   . Worried About Programme researcher, broadcasting/film/video in the Last Year: Not on file  . Ran Out of Food in the Last Year: Not on file  Transportation Needs:   . Lack of Transportation (Medical): Not on file  . Lack of Transportation (Non-Medical): Not on file  Physical Activity:   . Days of Exercise per Week: Not on file  . Minutes of Exercise per Session: Not on file  Stress:   . Feeling of Stress : Not on file  Social Connections:   . Frequency of Communication with Friends and Family: Not on file  . Frequency of Social Gatherings with Friends and Family: Not on file  . Attends Religious Services: Not on file  . Active Member of Clubs or Organizations: Not on file  . Attends Banker Meetings: Not on file  . Marital Status: Not on file  Intimate Partner Violence:   . Fear of Current or Ex-Partner: Not on file  . Emotionally Abused: Not on file  . Physically Abused: Not on file  . Sexually Abused: Not on file    Family History  Problem Relation Age of Onset  . Breast cancer Maternal Grandmother   . Colon cancer Maternal Grandmother   . Colon polyps Maternal Grandmother   . Cancer Maternal Grandmother   . Diabetes Paternal Grandfather   . Lupus Paternal Grandmother   . Thyroid disease Paternal Grandmother        Hypothyroid without surgery or irradiation.  . Osteopenia Mother   . Obesity Mother   . Migraines Father        "severe atypical"  . Migraines Sister     Past Medical History:  Diagnosis  Date  . Anxiety    Counseling and psychiatry  . Chronic headaches    Sees specialist  . Dyspepsia   . Fatigue   . Goiter   . Hashimoto's disease   . Hashimoto's thyroiditis   . History of lower GI bleeding   . History of recurrent UTIs    Sees urologist  . History of shingles    face right   . Hx of varicella   . Hypothyroidism, acquired, autoimmune   . IBS (irritable bowel syndrome)   . Obesity   . Oligomenorrhea   . PCOS (polycystic ovarian syndrome)   . PUD (peptic ulcer disease)   . Thyroid disease     Patient Active Problem List   Diagnosis Date Noted  .  Chronic migraine without aura without status migrainosus, not intractable 05/20/2019  . Migraine with aura and without status migrainosus, not intractable 05/20/2019  . Bipolar 1 disorder, mixed, moderate (HCC) 06/16/2016  . Mouth ulcer 05/31/2014  . Left facial pain 05/31/2014  . Herpes simplex labialis 04/17/2013  . Vaginal lesions 10/06/2012  . Gout attack possible.  09/05/2012  . Foot swelling 09/05/2012  . Hx of urinary stone 09/05/2012  . Dermatitis 09/03/2012  . Bright red rectal bleeding 09/01/2012  . Diarrhea 09/01/2012  . Adjustment disorder with anxiety 05/31/2012  . Menstrual migraine 05/31/2012  . Vitamin D deficiency 05/31/2012  . Hx of fracture of leg 05/31/2012  . History of recurrent UTIs   . Oligomenorrhea   . Goiter   . Hashimoto's thyroiditis   . Dyspepsia   . Hypothyroidism, acquired, autoimmune   . Fatigue   . IBS (irritable bowel syndrome) 05/11/2011  . PCOS (polycystic ovarian syndrome) 12/31/2010  . Obesity 12/31/2010    Past Surgical History:  Procedure Laterality Date  . BAND HEMORRHOIDECTOMY    . KNEE SURGERY     x 2 femoral.  break  sp fall  7th grade   . knees    . WISDOM TOOTH EXTRACTION      Current Outpatient Medications  Medication Sig Dispense Refill  . acetaminophen (TYLENOL) 650 MG CR tablet Take 650-1,300 mg by mouth every 8 (eight) hours as needed for pain.     [name]. ALPRAZolam (XANAX XR) 0.5 MG 24 hr tablet Take 0.5 mg by mouth 2 (two) times daily as needed for anxiety.     . Calcium Carbonate-Vitamin D (CALCIUM 500 + D PO) Take 1 tablet by mouth daily after breakfast.     . cetirizine (ZYRTEC) 10 MG tablet Take 10 mg by mouth daily.      . diclofenac sodium (VOLTAREN) 1 % GEL Apply 4 g topically 4 (four) times daily as needed. 200 g 1  . EPINEPHrine 0.3 mg/0.3 mL IJ SOAJ injection INJECT 1 SYRINGE IN THE MUSCLE AS NEEDED 2 each 0  . Fremanezumab-vfrm (AJOVY) 225 MG/1.5ML SOAJ Inject 1.5 mg into the skin every 30 (thirty) days. 4.5 mL 3  . lamoTRIgine (LAMICTAL) 150 MG tablet Take 300 mg by mouth daily.    . metFORMIN (GLUCOPHAGE) 500 MG tablet Take 500 mg by mouth daily with breakfast.    . Multiple Vitamin (MULTI-VITAMIN PO) Take 1 tablet by mouth daily after breakfast.     . Norethin-Eth Estradiol-Fe (WYMZYA FE) 0.4-35 MG-MCG tablet Chew 1 tablet by mouth daily.     . ondansetron (ZOFRAN-ODT) 4 MG disintegrating tablet Take 1 tablet (4 mg total) by mouth every 8 (eight) hours as needed for nausea. May take for migraine as well. 30 tablet 3  . Probiotic Product (PROBIOTIC PO) Take 1 capsule by mouth every morning.     . valACYclovir (VALTREX) 1000 MG tablet TAKE 2 TABLETS BY MOUTH TWICE DAILY OR AS DIRECTED--Please schedule your yearly follow up for further refills. [phone] 15 tablet 0  . ketorolac (TORADOL) 60 MG/2ML SOLN injection Inject 1-192ml (30-60mg ) intramuscularly at onset of migraine. May repeat in 6 hours. Max twice a day and 4 days per month. 10 mL 4  . Rimegepant Sulfate (NURTEC) 75 MG TBDP Take 75 mg by mouth daily as needed. For migraines. Take as close to onset of migraine as possible. One daily maximum. 8 tablet 6   No current facility-administered medications for this visit.    Allergies as of 05/21/2020 -  Review Complete 05/21/2020  Allergen Reaction Noted  . Ibuprofen Other (See Comments) 03/01/2018  . Milk-related compounds  Anaphylaxis 01/20/2013  . Nsaids Other (See Comments) 03/29/2011  . Onion Diarrhea and Nausea Only 01/12/2016  . Penicillins Other (See Comments) 07/24/2011  . Phenergan [promethazine]  05/21/2020    Vitals: BP 118/86 (BP Location: Right Arm, Patient Position: Sitting)   Pulse 74   Ht 5\' 5"  (1.651 m)   Wt 161 lb (73 kg)   BMI 26.79 kg/m  Last Weight:  Wt Readings from Last 1 Encounters:  05/21/20 161 lb (73 kg)   Last Height:   Ht Readings from Last 1 Encounters:  05/21/20 5\' 5"  (1.651 m)    Speech:    Speech is normal; fluent and spontaneous with normal comprehension.  Cognition:    The patient is oriented to person, place, and time;     recent and remote memory intact;     language fluent;     normal attention, concentration,     fund of knowledge Cranial Nerves:    The pupils are equal, round, and reactive to light. Visual fields are full to finger confrontation. Extraocular movements are intact. Trigeminal sensation is intact and the muscles of mastication are normal. The face is symmetric. The palate elevates in the midline. Hearing intact. Voice is normal. Shoulder shrug is normal. The tongue has normal motion without fasciculations.   Coordination:    No dysmetria  Gait:    Normal native gait  Motor Observation:    No asymmetry, no atrophy, and no involuntary movements noted. Tone:    Normal muscle tone.    Posture:    Posture is normal. normal erect    Strength:    Strength is V/V in the upper and lower limbs.      Sensation: intact to LT     Assessment/Plan:  29 year old with Chronic Migraines without and with aura failed multiple classes of first-lime migraine preventative medications. meds tried: Lamictal, paxil, Topamax(gave her gout and kidney stones), amitriptyline, verapamil, tried other medications as well. Discussed options for acute and preventative treatments, oral medications, injections, botox.   working great, - Triptan with severe  side effects, contraindicated. Ubrelvy with fatigue, can;t take. Try Nurtec again. - She has had GI bleeding with oral NSAIDs, Toradol worked great, will sned to home for severe migraines not responsive to other modalities.  - she loved toradol injection we will send to her home - Try Zofran for nausea and migraine - May take all above with Nurtec at once at onset of migraine with ibuprofen or tylenol  PRIOR  - Continue Ajovy monthly for prevention - Do not get pregnant on these medications due to risk of birth defects, use 2 forms of contraception - Exam normal, no changes in bowel/badder, no weakness, no new vision changes or sensory changes, no red flags, no changes in quality or severity of migraines, not exertional or positional. Can hold off on MRI brain at this time but if headaches continue I do think it would be prudent to order imaging of brain, discussed.   Discussed: There is increased risk for stroke in women with migraine with aura and a contraindication for the combined contraceptive pill for use by women who have migraine with aura. The risk for women with migraine without aura is lower. However other risk factors like smoking are far more likely to increase stroke risk than migraine. There is a recommendation for no smoking and  for the use of OCPs without estrogen such as progestogen only pills particularly for women with migraine with aura.[name] People who have migraine headaches with auras may be 3 times more likely to have a stroke caused by a blood clot, compared to migraine patients who don't see auras. Women who take hormone-replacement therapy may be 30 percent more likely to suffer a clot-based stroke than women not taking medication containing estrogen. Other risk factors like smoking and high blood pressure may be  much more important.  Discussed: To prevent or relieve headaches, try the following: Cool Compress. Lie down and place a cool compress on your head.  Avoid headache  triggers. If certain foods or odors seem to have triggered your migraines in the past, avoid them. A headache diary might help you identify triggers.  Include physical activity in your daily routine. Try a daily walk or other moderate aerobic exercise.  Manage stress. Find healthy ways to cope with the stressors, such as delegating tasks on your to-do list.  Practice relaxation techniques. Try deep breathing, yoga, massage and visualization.  Eat regularly. Eating regularly scheduled meals and maintaining a healthy diet might help prevent headaches. Also, drink plenty of fluids.  Follow a regular sleep schedule. Sleep deprivation might contribute to headaches Consider biofeedback. With this mind-body technique, you learn to control certain bodily functions -- such as muscle tension, heart rate and blood pressure -- to prevent headaches or reduce headache pain.    Proceed to emergency room if you experience new or worsening symptoms or symptoms do not resolve, if you have new neurologic symptoms or if headache is severe, or for any concerning symptom.   Provided education and documentation from American headache Society toolbox including articles on: chronic migraine medication overuse headache, chronic migraines, prevention of migraines, behavioral and other nonpharmacologic treatments for headache.  Meds ordered this encounter  Medications  . Rimegepant Sulfate (NURTEC) 75 MG TBDP    Sig: Take 75 mg by mouth daily as needed. For migraines. Take as close to onset of migraine as possible. One daily maximum.    Dispense:  8 tablet    Refill:  6  . ketorolac (TORADOL) 60 MG/2ML SOLN injection    Sig: Inject 1-46ml (30-60mg ) intramuscularly at onset of migraine. May repeat in 6 hours. Max twice a day and 4 days per month.    Dispense:  10 mL    Refill:  4  . Fremanezumab-vfrm (AJOVY) 225 MG/1.5ML SOAJ    Sig: Inject 1.5 mg into the skin every 30 (thirty) days.    Dispense:  4.5 mL    Refill:  3     3 months supply    Cc: Panosh, [name], MD  Naomie Dean, MD  [facility] [address] [place], [place] [address]  Phone [phone] Fax [phone]  I spent 20 minutes of face-to-face and non-face-to-face time with patient on the  1. Chronic migraine without aura without status migrainosus, not intractable    diagnosis.  This included previsit chart review, lab review, study review, order entry, electronic health record documentation, patient education on the different diagnostic and therapeutic options, counseling and coordination of care, risks and benefits of management, compliance, or risk factor reduction  I spent 25 minutes of face-to-face and non-face-to-face time with patient on the  1. Chronic migraine without aura without status migrainosus, not intractable    diagnosis.  This included previsit chart review, lab review, study review, order entry, electronic health record documentation,  patient education on the different diagnostic and therapeutic options, counseling and coordination of care, risks and benefits of management, compliance, or risk factor reduction

## 2020-05-21 ENCOUNTER — Ambulatory Visit: Payer: BC Managed Care – PPO | Admitting: Neurology

## 2020-05-21 ENCOUNTER — Encounter: Payer: Self-pay | Admitting: Neurology

## 2020-05-21 VITALS — BP 118/86 | HR 74 | Ht 65.0 in | Wt 161.0 lb

## 2020-05-21 DIAGNOSIS — G43709 Chronic migraine without aura, not intractable, without status migrainosus: Secondary | ICD-10-CM

## 2020-05-21 MED ORDER — NURTEC 75 MG PO TBDP: 75.0000 mg | ORAL_TABLET | Freq: Every day | ORAL | 6 refills | Status: DC | PRN

## 2020-05-21 MED ORDER — KETOROLAC TROMETHAMINE 60 MG/2ML IM SOLN: INTRAMUSCULAR | 4 refills | Status: DC

## 2020-05-21 MED ORDER — AJOVY 225 MG/1.5ML ~~LOC~~ SOAJ: 1.5000 mg | SUBCUTANEOUS | 3 refills | Status: DC

## 2020-05-21 NOTE — Patient Instructions (Signed)
Toradol injections follow instructions Nurtec one daily as needed  Rimegepant oral dissolving tablet What is this medicine? RIMEGEPANT (ri ME je pant) is used to treat migraine headaches with or without aura. An aura is a strange feeling or visual disturbance that warns you of an attack. It is not used to prevent migraines. This medicine may be used for other purposes; ask your health care provider or pharmacist if you have questions. COMMON BRAND NAME(S): NURTEC ODT What should I tell my health care provider before I take this medicine? They need to know if you have any of these conditions:  kidney disease  liver disease  an unusual or allergic reaction to rimegepant, other medicines, foods, dyes, or preservatives  pregnant or trying to get pregnant  breast-feeding How should I use this medicine? Take the medicine by mouth. Follow the directions on the prescription label. Leave the tablet in the sealed blister pack until you are ready to take it. With dry hands, open the blister and gently remove the tablet. If the tablet breaks or crumbles, throw it away and take a new tablet out of the blister pack. Place the tablet in the mouth and allow it to dissolve, and then swallow. Do not cut, crush, or chew this medicine. You do not need water to take this medicine. Talk to your pediatrician about the use of this medicine in children. Special care may be needed. Overdosage: If you think you have taken too much of this medicine contact a poison control center or emergency room at once. NOTE: This medicine is only for you. Do not share this medicine with others. What if I miss a dose? This does not apply. This medicine is not for regular use. What may interact with this medicine? This medicine may interact with the following medications:  certain medicines for fungal infections like fluconazole, itraconazole  rifampin This list may not describe all possible interactions. Give your health  care provider a list of all the medicines, herbs, non-prescription drugs, or dietary supplements you use. Also tell them if you smoke, drink alcohol, or use illegal drugs. Some items may interact with your medicine. What should I watch for while using this medicine? Visit your health care professional for regular checks on your progress. Tell your health care professional if your symptoms do not start to get better or if they get worse. What side effects may I notice from receiving this medicine? Side effects that you should report to your doctor or health care professional as soon as possible:  allergic reactions like skin rash, itching or hives; swelling of the face, lips, or tongue Side effects that usually do not require medical attention (report these to your doctor or health care professional if they continue or are bothersome):  nausea This list may not describe all possible side effects. Call your doctor for medical advice about side effects. You may report side effects to FDA at 1-800-FDA-1088. Where should I keep my medicine? Keep out of the reach of children. Store at room temperature between 15 and 30 degrees C (59 and 86 degrees F). Throw away any unused medicine after the expiration date. NOTE: This sheet is a summary. It may not cover all possible information. If you have questions about this medicine, talk to your doctor, pharmacist, or health care provider.  2020 Elsevier/Gold Standard (2018-10-16 00:21:31) Ketorolac injection What is this medicine? KETOROLAC (kee toe ROLE ak) is a non-steroidal anti-inflammatory drug (NSAID). It is used to treat moderate to severe  pain for up to 5 days. It is commonly used after surgery. This medicine should not be used for more than 5 days. This medicine may be used for other purposes; ask your health care provider or pharmacist if you have questions. COMMON BRAND NAME(S): Toradol What should I tell my health care provider before I take this  medicine? They need to know if you have any of these conditions:  asthma, especially aspirin-sensitive asthma  bleeding problems  coronary artery bypass graft (CABG) surgery within the past 2 weeks  kidney disease  stomach bleed, ulcer, or other problem  taking aspirin, other NSAID, or probenecid  an unusual or allergic reaction to ketorolac, tromethamine, aspirin, other NSAIDs, other medicines, foods, dyes or preservatives  pregnant or trying to get pregnant  breast-feeding How should I use this medicine? This medicine is for injection into a muscle or into a vein. It is given by a health care professional in a hospital or clinic setting. Talk to your pediatrician regarding the use of this medicine in children. Special care may be needed. Patients over 38 years old may have a stronger reaction and need a smaller dose. Overdosage: If you think you have taken too much of this medicine contact a poison control center or emergency room at once. NOTE: This medicine is only for you. Do not share this medicine with others. What if I miss a dose? This does not apply. What may interact with this medicine? Do not take this medicine with any of the following medications:  aspirin and aspirin-like medicines  cidofovir  methotrexate  NSAIDs, medicines for pain and inflammation, like ibuprofen or naproxen  pentoxifylline  probenecid This medicine may also interact with the following medications:  alcohol  alendronate  alprazolam  carbamazepine  diuretics  flavocoxid  fluoxetine  ginkgo  lithium  medicines for blood pressure like enalapril  medicines that affect platelets like pentoxifylline  medicines that treat or prevent blood clots like heparin, warfarin  muscle relaxants  pemetrexed  phenytoin  thiothixene This list may not describe all possible interactions. Give your health care provider a list of all the medicines, herbs, non-prescription drugs, or  dietary supplements you use. Also tell them if you smoke, drink alcohol, or use illegal drugs. Some items may interact with your medicine. What should I watch for while using this medicine? Tell your doctor or healthcare provider if your symptoms do not start to get better or if they get worse. This medicine may cause serious skin reactions. They can happen weeks to months after starting the medicine. Contact your healthcare provider right away if you notice fevers or flu-like symptoms with a rash. The rash may be red or purple and then turn into blisters or peeling of the skin. Or, you might notice a red rash with swelling of the face, lips or lymph nodes in your neck or under your arms. This medicine does not prevent heart attack or stroke. In fact, this medicine may increase the chance of a heart attack or stroke. The chance may increase with longer use of this medicine and in people who have heart disease. If you take aspirin to prevent heart attack or stroke, talk with your doctor or healthcare provider. Do not take medicines such as ibuprofen and naproxen with this medicine. Side effects such as stomach upset, nausea, or ulcers may be more likely to occur. Many medicines available without a prescription should not be taken with this medicine. This medicine can cause ulcers and bleeding  in the stomach and intestines at any time during treatment. Do not smoke cigarettes or drink alcohol. These increase irritation to your stomach and can make it more susceptible to damage from this medicine. Ulcers and bleeding can happen without warning symptoms and can cause death. This medicine can cause you to bleed more easily. Try to avoid damage to your teeth and gums when you brush or floss your teeth. What side effects may I notice from receiving this medicine? Side effects that you should report to your doctor or health care professional as soon as possible:  allergic reactions like skin rash, itching or  hives, swelling of the face, lips, or tongue  breathing problems  high blood pressure  nausea, vomiting  redness, blistering, peeling or loosening of the skin, including inside the mouth  severe stomach pain  signs and symptoms of bleeding such as bloody or black, tarry stools; red or dark-brown urine; spitting up blood or brown material that looks like coffee grounds; red spots on the skin; unusual bruising or bleeding from the eye, gums, or nose  signs and symptoms of a blood clot changes in vision; chest pain; severe, sudden headache; trouble speaking; sudden numbness or weakness of the face, arm, or leg  trouble passing urine or change in the amount of urine  unexplained weight gain or swelling  unusually weak or tired  yellowing of eyes or skin Side effects that usually do not require medical attention (report to your doctor or health care professional if they continue or are bothersome):  diarrhea  dizziness  headache  heartburn This list may not describe all possible side effects. Call your doctor for medical advice about side effects. You may report side effects to FDA at 1-800-FDA-1088. Where should I keep my medicine? This drug is given in a hospital or clinic and will not be stored at home. NOTE: This sheet is a summary. It may not cover all possible information. If you have questions about this medicine, talk to your doctor, pharmacist, or health care provider.  2020 Elsevier/Gold Standard (2018-10-18 15:10:53)

## 2020-05-29 DIAGNOSIS — F3176 Bipolar disorder, in full remission, most recent episode depressed: Secondary | ICD-10-CM | POA: Diagnosis not present

## 2020-05-29 DIAGNOSIS — F3174 Bipolar disorder, in full remission, most recent episode manic: Secondary | ICD-10-CM | POA: Diagnosis not present

## 2020-05-29 DIAGNOSIS — F410 Panic disorder [episodic paroxysmal anxiety] without agoraphobia: Secondary | ICD-10-CM | POA: Diagnosis not present

## 2020-06-02 DIAGNOSIS — F411 Generalized anxiety disorder: Secondary | ICD-10-CM | POA: Diagnosis not present

## 2020-06-02 DIAGNOSIS — F3132 Bipolar disorder, current episode depressed, moderate: Secondary | ICD-10-CM | POA: Diagnosis not present

## 2020-06-02 DIAGNOSIS — F4312 Post-traumatic stress disorder, chronic: Secondary | ICD-10-CM | POA: Diagnosis not present

## 2020-06-09 DIAGNOSIS — M1711 Unilateral primary osteoarthritis, right knee: Secondary | ICD-10-CM | POA: Diagnosis not present

## 2020-06-09 DIAGNOSIS — F4312 Post-traumatic stress disorder, chronic: Secondary | ICD-10-CM | POA: Diagnosis not present

## 2020-06-09 DIAGNOSIS — F411 Generalized anxiety disorder: Secondary | ICD-10-CM | POA: Diagnosis not present

## 2020-06-09 DIAGNOSIS — S83241A Other tear of medial meniscus, current injury, right knee, initial encounter: Secondary | ICD-10-CM | POA: Diagnosis not present

## 2020-06-09 DIAGNOSIS — F3132 Bipolar disorder, current episode depressed, moderate: Secondary | ICD-10-CM | POA: Diagnosis not present

## 2020-06-16 DIAGNOSIS — F4312 Post-traumatic stress disorder, chronic: Secondary | ICD-10-CM | POA: Diagnosis not present

## 2020-06-16 DIAGNOSIS — F3132 Bipolar disorder, current episode depressed, moderate: Secondary | ICD-10-CM | POA: Diagnosis not present

## 2020-06-16 DIAGNOSIS — F411 Generalized anxiety disorder: Secondary | ICD-10-CM | POA: Diagnosis not present

## 2020-06-30 ENCOUNTER — Telehealth: Payer: Self-pay | Admitting: *Deleted

## 2020-06-30 NOTE — Telephone Encounter (Signed)
Nurtec PA completed on Cover My Meds. Key: [id number]. Awaiting determination from [employer].

## 2020-07-01 DIAGNOSIS — M25561 Pain in right knee: Secondary | ICD-10-CM | POA: Diagnosis not present

## 2020-07-02 ENCOUNTER — Telehealth: Payer: Self-pay | Admitting: Neurology

## 2020-07-02 NOTE — Telephone Encounter (Signed)
Pt request refill Rimegepant Sulfate (NURTEC) 75 MG TBDP at [facility] [phone]

## 2020-07-02 NOTE — Telephone Encounter (Signed)
Called and spoke with pt. Advised I see where BC,RN has submitted a PA for medication, it is pending. Pharmacy has refills on file. Both Dr. Lucia Gaskins and Elana Alm out of office. I will see if I can get an update on PA status. She verbalized understanding.

## 2020-07-02 NOTE — Telephone Encounter (Signed)
I was able to full up PA from key: [id number]. PA still pending determination from [facility]

## 2020-07-03 ENCOUNTER — Other Ambulatory Visit: Payer: Self-pay | Admitting: Neurology

## 2020-07-03 DIAGNOSIS — F411 Generalized anxiety disorder: Secondary | ICD-10-CM | POA: Diagnosis not present

## 2020-07-03 DIAGNOSIS — F4312 Post-traumatic stress disorder, chronic: Secondary | ICD-10-CM | POA: Diagnosis not present

## 2020-07-03 DIAGNOSIS — F3132 Bipolar disorder, current episode depressed, moderate: Secondary | ICD-10-CM | POA: Diagnosis not present

## 2020-07-03 MED ORDER — "BD LUER-LOK SYRINGE 23G X 1"" 3 ML MISC": 0 refills | Status: DC

## 2020-07-03 NOTE — Telephone Encounter (Signed)
Catherine [name] from [facility] called to inform that PA has been approved & the approval was faxed over.

## 2020-07-03 NOTE — Telephone Encounter (Addendum)
PA approved effective from 06/30/2020 through 09/21/2020. Called pt to let her know PA approved, she can pick up rx from pharmacy now. She verbalized understanding and appreciation.

## 2020-07-07 DIAGNOSIS — F3132 Bipolar disorder, current episode depressed, moderate: Secondary | ICD-10-CM | POA: Diagnosis not present

## 2020-07-07 DIAGNOSIS — F4312 Post-traumatic stress disorder, chronic: Secondary | ICD-10-CM | POA: Diagnosis not present

## 2020-07-07 DIAGNOSIS — F411 Generalized anxiety disorder: Secondary | ICD-10-CM | POA: Diagnosis not present

## 2020-07-14 DIAGNOSIS — F411 Generalized anxiety disorder: Secondary | ICD-10-CM | POA: Diagnosis not present

## 2020-07-14 DIAGNOSIS — F4312 Post-traumatic stress disorder, chronic: Secondary | ICD-10-CM | POA: Diagnosis not present

## 2020-07-14 DIAGNOSIS — F3132 Bipolar disorder, current episode depressed, moderate: Secondary | ICD-10-CM | POA: Diagnosis not present

## 2020-07-16 DIAGNOSIS — M1711 Unilateral primary osteoarthritis, right knee: Secondary | ICD-10-CM | POA: Diagnosis not present

## 2020-07-21 DIAGNOSIS — F3132 Bipolar disorder, current episode depressed, moderate: Secondary | ICD-10-CM | POA: Diagnosis not present

## 2020-07-21 DIAGNOSIS — F4312 Post-traumatic stress disorder, chronic: Secondary | ICD-10-CM | POA: Diagnosis not present

## 2020-07-21 DIAGNOSIS — F411 Generalized anxiety disorder: Secondary | ICD-10-CM | POA: Diagnosis not present

## 2020-07-21 NOTE — Telephone Encounter (Signed)
Per Cover My Meds, Nurtec approved Effective from 06/30/2020 through 09/21/2020.

## 2020-07-23 DIAGNOSIS — M1711 Unilateral primary osteoarthritis, right knee: Secondary | ICD-10-CM | POA: Diagnosis not present

## 2020-07-28 DIAGNOSIS — F411 Generalized anxiety disorder: Secondary | ICD-10-CM | POA: Diagnosis not present

## 2020-07-28 DIAGNOSIS — F3132 Bipolar disorder, current episode depressed, moderate: Secondary | ICD-10-CM | POA: Diagnosis not present

## 2020-07-28 DIAGNOSIS — F4312 Post-traumatic stress disorder, chronic: Secondary | ICD-10-CM | POA: Diagnosis not present

## 2020-07-30 DIAGNOSIS — M1711 Unilateral primary osteoarthritis, right knee: Secondary | ICD-10-CM | POA: Diagnosis not present

## 2020-07-31 ENCOUNTER — Other Ambulatory Visit: Payer: Self-pay | Admitting: Internal Medicine

## 2020-08-04 DIAGNOSIS — F3132 Bipolar disorder, current episode depressed, moderate: Secondary | ICD-10-CM | POA: Diagnosis not present

## 2020-08-04 DIAGNOSIS — F4312 Post-traumatic stress disorder, chronic: Secondary | ICD-10-CM | POA: Diagnosis not present

## 2020-08-04 DIAGNOSIS — F411 Generalized anxiety disorder: Secondary | ICD-10-CM | POA: Diagnosis not present

## 2020-09-04 NOTE — Telephone Encounter (Signed)
Received another Nurtec PA. Completed this on Cover My Meds. Key: [id number]. Awaiting determination from [employer].

## 2020-09-04 NOTE — Telephone Encounter (Signed)
The request has been approved. The authorization is effective for a maximum of 6 fills from 09/04/2020 to 03/03/2021, as long as the member is enrolled in their current health plan. This has been approved for a quantity limit of 18.0 with a day supply limit of 30.0. A written notification letter will follow with additional details. 

## 2020-09-08 NOTE — Telephone Encounter (Signed)
Faxed approval letter to pharmacy. Received a receipt of confirmation.  

## 2020-09-10 ENCOUNTER — Telehealth: Payer: Self-pay

## 2020-09-10 NOTE — Telephone Encounter (Signed)
I have submitted a PA request for Ajovy on CMM, Key: [id number].   Waiting for [employer] to respond with questions.

## 2020-09-11 NOTE — Telephone Encounter (Signed)
Clinical questions answered and PA submitted on Cover My Meds. Awaiting determination from [employer].

## 2020-09-15 NOTE — Telephone Encounter (Signed)
Denied  Requirements: You have tried and failed, or intolerance to, at least two formulary alternatives (if available) within the same pharmacological class as the requested medication. Your doctor told us that you have a diagnosis of chronic migraines (15 or more headache days per month with 8 or more migraine days per month).  We do not have information showing that you have tried [employer]

## 2021-01-08 ENCOUNTER — Other Ambulatory Visit: Payer: Self-pay | Admitting: Neurology

## 2021-01-08 DIAGNOSIS — G43709 Chronic migraine without aura, not intractable, without status migrainosus: Secondary | ICD-10-CM

## 2021-01-20 NOTE — Telephone Encounter (Signed)
Ajovy PA completed. Key: [id number]. awaiting determination from [employer].

## 2021-01-26 NOTE — Telephone Encounter (Signed)
Received fax from [employer] with additional questions for Ajovy PA. Form completed and faxed back with last office note. Received a receipt of confirmation.

## 2021-02-02 ENCOUNTER — Encounter: Payer: Self-pay | Admitting: Neurology

## 2021-02-02 NOTE — Telephone Encounter (Signed)
PA denied. The insurance requires that the patient has tried/failed emgality or aimovig first.  Pt may be able to continue to use a savings card. If not Dr Lucia Gaskins would suggest switching to [facility].

## 2021-02-17 ENCOUNTER — Other Ambulatory Visit: Payer: Self-pay | Admitting: Neurology

## 2021-02-17 MED ORDER — AIMOVIG 140 MG/ML ~~LOC~~ SOAJ
140.0000 mg | SUBCUTANEOUS | 11 refills | Status: DC
Start: 2021-02-17 — End: 2022-01-19

## 2021-03-02 ENCOUNTER — Other Ambulatory Visit: Payer: Self-pay | Admitting: Internal Medicine

## 2021-03-09 ENCOUNTER — Telehealth: Payer: Self-pay | Admitting: *Deleted

## 2021-03-09 ENCOUNTER — Encounter: Payer: Self-pay | Admitting: *Deleted

## 2021-03-09 NOTE — Telephone Encounter (Signed)
Completed Aimovig PA on Cover My Meds. Key: [id number]. Approved immediately by [employer].    The request has been approved. The authorization is effective for a maximum of 6 fills from 03/09/2021 to 09/08/2021, as long as the member is enrolled in their current health plan. This has been approved for a quantity limit of 1 with a day supply limit of 30. A written notification letter will follow with additional details.

## 2021-03-11 ENCOUNTER — Telehealth: Payer: Self-pay | Admitting: Neurology

## 2021-03-11 NOTE — Telephone Encounter (Signed)
[facility]) called, following up on PA for NURTEC 75 MG TBDP. Would like a call from the nurse.

## 2021-03-11 NOTE — Telephone Encounter (Signed)
Reached out to pt yesterday for update on how Nurtec works for her. Cannot complete PA right now. No need to call at this time.

## 2021-03-17 NOTE — Telephone Encounter (Signed)
Completed Nurtec PA on Cover My Meds. Key: [id number]. Awaiting determination from [employer].

## 2021-03-17 NOTE — Telephone Encounter (Signed)
The request has been approved. The authorization is effective for a maximum of 12 fills from 03/17/2021 to 03/16/2022, as long as the member is enrolled in their current health plan. The request was approved with a quantity restriction. This has been approved for a quantity limit of 18 with a day supply limit of 30. A written notification letter will follow with additional details.

## 2021-04-21 ENCOUNTER — Other Ambulatory Visit: Payer: Self-pay | Admitting: Neurology

## 2021-04-22 ENCOUNTER — Other Ambulatory Visit (HOSPITAL_COMMUNITY)
Admission: RE | Admit: 2021-04-22 | Discharge: 2021-04-22 | Disposition: A | Discharge status: Home / Self Care | Payer: 59 | Source: Ambulatory Visit | Attending: Internal Medicine | Admitting: Internal Medicine

## 2021-04-22 ENCOUNTER — Encounter: Payer: Self-pay | Admitting: Internal Medicine

## 2021-04-22 ENCOUNTER — Other Ambulatory Visit: Payer: Self-pay

## 2021-04-22 ENCOUNTER — Ambulatory Visit (INDEPENDENT_AMBULATORY_CARE_PROVIDER_SITE_OTHER): Payer: 59 | Admitting: Internal Medicine

## 2021-04-22 VITALS — BP 108/70 | HR 92 | Temp 98.2°F | Ht 65.0 in | Wt 148.6 lb

## 2021-04-22 DIAGNOSIS — E063 Autoimmune thyroiditis: Secondary | ICD-10-CM

## 2021-04-22 DIAGNOSIS — Z1159 Encounter for screening for other viral diseases: Secondary | ICD-10-CM | POA: Diagnosis not present

## 2021-04-22 DIAGNOSIS — Z113 Encounter for screening for infections with a predominantly sexual mode of transmission: Secondary | ICD-10-CM | POA: Insufficient documentation

## 2021-04-22 NOTE — Progress Notes (Signed)
Chief Complaint  Patient presents with   Exposure to STD    HPI: Catherine [name] 30 y.o. come in for STI screening Long-term relationship has ended her partner we will do very high risk partner themselves. She has no symptoms GU GI related to possible STI unusual rashes etc. she does get recurrent cold sores and has had shingles. No know ivdu exposures  but unknown.  Hx of  hashimoto's  thyroiditis   no recent testing  ROS: See pertinent positives and negatives per HPI. Has controlled at this time   Past Medical History:  Diagnosis Date   Anxiety    Counseling and psychiatry   Chronic headaches    Sees specialist   Dyspepsia    Fatigue    Goiter    Hashimoto's disease    Hashimoto's thyroiditis    History of lower GI bleeding    History of recurrent UTIs    Sees urologist   History of shingles    face right    Hx of varicella    Hypothyroidism, acquired, autoimmune    IBS (irritable bowel syndrome)    Obesity    Oligomenorrhea    PCOS (polycystic ovarian syndrome)    PUD (peptic ulcer disease)    Thyroid disease     Family History  Problem Relation Age of Onset   Breast cancer Maternal Grandmother    Colon cancer Maternal Grandmother    Colon polyps Maternal Grandmother    Cancer Maternal Grandmother    Diabetes Paternal Grandfather    Lupus Paternal Grandmother    Thyroid disease Paternal Grandmother        Hypothyroid without surgery or irradiation.   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 apartment. 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 Will be difficult to go back financially.      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     ALPRAZolam (XANAX XR) 0.5 MG 24 hr tablet Take 0.5 mg by mouth 2 (two) times daily as needed for anxiety.      Calcium Carbonate-Vitamin D (CALCIUM 500 + D PO) Take 1 tablet by mouth daily after breakfast.      cetirizine (ZYRTEC) 10 MG tablet Take 10 mg by mouth daily.       diclofenac sodium (VOLTAREN) 1 % GEL Apply 4 g topically 4 (four) times daily as needed. 200 g 1   EPINEPHRINE 0.3 mg/0.3 mL IJ SOAJ injection INJECT 1 SYRINGE IN THE MUSCLE AS NEEDED 2 each 0   Erenumab-aooe (AIMOVIG) 140 MG/ML SOAJ Inject 140 mg into the skin every 30 (thirty) days. 1 mL 11   ketorolac (TORADOL) 60 MG/2ML SOLN injection Inject 1-4ml (30-60mg ) intramuscularly at onset of migraine. May repeat in 6 hours. Max twice a day and 4 days per month. 10 mL 4   lamoTRIgine (LAMICTAL) 150 MG tablet Take 300 mg by mouth daily.      metFORMIN (GLUCOPHAGE) 500 MG tablet Take 500 mg by mouth daily with breakfast.     Multiple Vitamin (MULTI-VITAMIN PO) Take 1 tablet by mouth daily after breakfast.      Norethin-Eth Estradiol-Fe (WYMZYA FE) 0.4-35 MG-MCG tablet Chew 1 tablet by mouth daily.      NURTEC 75 MG TBDP DISSOLVE ONE TABLET BY MOUTH DAILY AS NEEDED FOR MIGRAINES. TAKE AS CLOSE TO ONSET OF MIGRAINE AS POSSIBLE. 1 TABLET DAILY MAXIMUM 8 tablet 6   ondansetron (ZOFRAN-ODT) 4 MG disintegrating tablet Take 1 tablet (4 mg total) by mouth every 8 (eight) hours as needed for nausea. May take for migraine as well. 30 tablet 3   Probiotic Product (PROBIOTIC PO) Take 1 capsule by mouth every morning.      SYRINGE-NEEDLE, DISP, 3 ML (B-D 3CC LUER-LOK SYR 23GX1") 23G X 1" 3 ML MISC TO USE WITH KETOROLAC IN THE MUSCLE 10 each 0   valACYclovir (VALTREX) 1000 MG tablet TAKE 2 TABLETS BY MOUTH TWICE DAILY OR AS DIRECTED 15 tablet 0   Fremanezumab-vfrm (AJOVY) 225 MG/1.5ML SOAJ Inject 1.5 mg into the skin every 30 (thirty) days. 4.5 mL 3   No facility-administered medications prior to visit.     EXAM:  BP 108/70 (BP Location: Left Arm, Patient Position: Sitting, Cuff Size: Normal)   Pulse 92   Temp 98.2 F (36.8 C) (Oral)   Ht 5\' 5"  (1.651 m)   Wt 148 lb 9.6 oz (67.4 kg)   SpO2 98%   BMI 24.73 kg/m   Body mass index is 24.73 kg/m.  GENERAL: vitals reviewed and listed above, alert, oriented, appears well hydrated and in no acute distress HEENT: atraumatic, conjunctiva  clear, no obvious abnormalities on inspection of external nose and ears OP : masked  NECK: no obvious masses on inspection palpation  LUNGS: clear to auscultation bilaterally, no wheezes, rales or rhonchi, good air movement CV: HRRR, no clubbing cyanosis or  peripheral edema nl cap refill  Abdomen:  Sof,t normal bowel sounds without hepatosplenomegaly, no guarding rebound or masses no CVA tenderness MS: moves all extremities without noticeable focal   abnormality PSYCH: pleasant and cooperative, no obvious depression or anxiety Lab Results  Component Value Date   WBC 7.1 01/12/2016   HGB 13.5 01/12/2016   HCT 40.8 01/12/2016   PLT 268 01/12/2016   GLUCOSE  92 01/12/2016   ALT 15 01/12/2016   AST 18 01/12/2016   NA 135 01/12/2016   K 4.2 01/12/2016   CL 103 01/12/2016   CREATININE 0.80 01/12/2016   BUN 5 (L) 01/12/2016   CO2 25 01/12/2016   TSH 0.81 08/16/2017   HGBA1C 4.4 12/23/2016   BP Readings from Last 3 Encounters:  04/22/21 108/70  05/21/20 118/86  11/15/19 111/80    ASSESSMENT AND PLAN:  Discussed the following assessment and plan:  Routine screening for STI (sexually transmitted infection) - poss exposure  no sx  - Plan: RPR, HIV Antibody (routine testing w rflx), Hepatitis C antibody, Urine cytology ancillary only, Urine cytology ancillary only, Hepatitis C antibody, HIV Antibody (routine testing w rflx), RPR  Hashimoto's thyroiditis - Plan: TSH, T4, free, T4, free, TSH  Need for hepatitis C screening test - Plan: Hepatitis C antibody, Hepatitis C antibody Fu as appropriate  -Patient advised to return or notify health care team  if  new concerns arise.  Patient Instructions  Will notify you  of labs when available.  Exam is good.    [name]. Chace Klippel M.D.

## 2021-04-22 NOTE — Patient Instructions (Signed)
Will notify you  of labs when available.  Exam is good.

## 2021-04-23 LAB — T4, FREE: Free T4: 0.86 ng/dL (ref 0.60–1.60)

## 2021-04-23 LAB — HIV ANTIBODY (ROUTINE TESTING W REFLEX): HIV 1&2 Ab, 4th Generation: NONREACTIVE

## 2021-04-23 LAB — HEPATITIS C ANTIBODY
Hepatitis C Ab: NONREACTIVE
SIGNAL TO CUT-OFF: 0.01 (ref <1.00)

## 2021-04-23 LAB — RPR: RPR Ser Ql: NONREACTIVE

## 2021-04-23 LAB — TSH: TSH: 1.35 u[IU]/mL (ref 0.35–5.50)

## 2021-04-24 LAB — URINE CYTOLOGY ANCILLARY ONLY
Chlamydia: NEGATIVE
Comment: NEGATIVE
Comment: NORMAL
Neisseria Gonorrhea: NEGATIVE

## 2021-04-25 NOTE — Progress Notes (Signed)
All tests are normal or negative

## 2021-05-21 ENCOUNTER — Ambulatory Visit: Payer: BC Managed Care – PPO | Admitting: Adult Health

## 2021-07-16 ENCOUNTER — Ambulatory Visit: Payer: 59 | Admitting: Adult Health

## 2021-07-16 ENCOUNTER — Encounter: Payer: Self-pay | Admitting: Adult Health

## 2021-07-16 DIAGNOSIS — G43709 Chronic migraine without aura, not intractable, without status migrainosus: Secondary | ICD-10-CM

## 2021-07-16 MED ORDER — "BD LUER-LOK SYRINGE 23G X 1"" 3 ML MISC": 0 refills | Status: DC

## 2021-07-16 MED ORDER — NURTEC 75 MG PO TBDP: ORAL_TABLET | ORAL | 6 refills | Status: DC

## 2021-07-16 MED ORDER — KETOROLAC TROMETHAMINE 60 MG/2ML IM SOLN: INTRAMUSCULAR | 4 refills | Status: DC

## 2021-07-16 NOTE — Progress Notes (Addendum)
PATIENT: Catherine [name] DOB: 07/18/1991  REASON FOR VISIT: follow up HISTORY FROM: patient   HISTORY OF PRESENT ILLNESS: Today 07/16/21:  Ms. Sterry is a 30 year old female with a history of migraine headaches.  She returns today for follow-up.  She reports that Aimovig continues to work well for her.  She takes Nurtec for abortive therapy will use Toradol if Nurtec is not beneficial.  She has approximately 2 headaches a month that typically occur around her menstrual cycle  meds tried: Lamictal, paxil, Topamax(gave her gout and kidney stones), amitriptyline, verapamil, tried other medications as well, maxalt, imitrex(side effects),   HISTORY (copied from Dr. Trevor Mace note) Interval history 05/21/2020: Catherine [name] is still working great. Catherine [name] works fine. She could not get nurtec approved. Nurtec didn't cause tiredness but Ubrelvy did. The triptan had side effects, she has had side effects to multiple triptans.    Interval history 11/15/2019: She feels better, migraines cut in half from at least 8 to 4 migraine days a month. She is on extended birth control and that has helped as well. She has PCOS and this birth control helps. Nurtec helps 50% of the time. She is very happy with the Ajovy. The nurtec wokrs only a little bit, she has tried Scientist, clinical (histocompatibility and immunogenetics) and possibly imitrex, will try zomig. May also try Tosymra or injectable imitrex. Can also try toradol.    HPI:  Catherine [name] is a 30 y.o. female here as requested by Panosh, [name], MD for migraines sine the age of 43 since puberty. She has seen Dr. Vickey Huger. She was on multiple medications. She has tried multiple birth controls to see if this woul dhelp with her migraines. She has PCOS diagnosed at the age of 5. Migraines are very hormonal. She gets migraines a few days after starting her period. Over the last 6 months she has been having them all throughout the month not just at her period. 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 She tries to use Tylenol, doesn't help. She has sensitivity to light and migraine location varies between sides or both, she has migraines without aura and also with aura - the  aura is swirling lights and blocks but not all the time she also has migraines without aura. Endorses photophobia, phonophbia, pounding, throbbing,nausea,vomiting. A dark cool room helps, moving makes it work, smells may trigger. Have lasted 24-48 hours. She has daily headaches. She has easily 8 migraine days a month and they moderately severe to severe and interfering to her life ongoing over 6 months. No other focal neurologic deficits, associated symptoms, inciting events or modifiable factors.     meds tried: Lamictal, paxil, Topamax(gave her gout and kidney stones), amitriptyline, verapamil, tried other medications as well, maxalt, imitrex(side effects),    Reviewed notes, labs and imaging from outside physicians, which showed:   TSH/T4 Free 08/16/2017 normal   Reviewed notes from Dr. Fabian Sharp, she has had migraines since a teen, mostly migraines during menstrual cycle but worsening over the years, more frequent. She has a remote hx of neurologic care, requested neurology, past meds not helping, kidney stones with topamax. 10 hours of sleep a night, 5 cats. 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 On Lamotrigine for psychiatric d/o.Reviewed examination, normal gen,heent,neck,lungs,CV,psych,musculoskeletal   I will request [facility] from [facility] where she was seen in the past.    REVIEW OF SYSTEMS: Out of a complete 14 system review  of symptoms, the patient complains only of the following symptoms, and all other reviewed systems are negative.  ALLERGIES: Allergies  Allergen Reactions   Ibuprofen Other (See Comments)    GI bleed   Milk-Related Compounds Anaphylaxis   Nsaids Other (See Comments)    CREATES G.I. BLEEDING   Onion Diarrhea and Nausea Only    NO GARLIC,  EITHER   Penicillins Other (See Comments)    Has patient had a PCN reaction causing immediate rash, facial/tongue/throat swelling, SOB or lightheadedness with hypotension: No Has patient had a PCN reaction causing severe rash involving mucus membranes or skin necrosis: No CREATED A G.I. "BLEED" Has patient had a PCN reaction that required hospitalization No Has patient had a PCN reaction occurring within the last 10 years: Yes If all of the above answers are "NO", then may proceed with Cephalosporin use.    Phenergan [Promethazine]     Hallucinations, only takes when supervised.     HOME MEDICATIONS: Outpatient Medications Prior to Visit  Medication Sig Dispense Refill   acetaminophen (TYLENOL) 650 MG CR tablet Take 650-1,300 mg by mouth every 8 (eight) hours as needed for pain.     ALPRAZolam (XANAX XR) 0.5 MG 24 hr tablet Take 0.5 mg by mouth 2 (two) times daily as needed for anxiety.      Calcium Carbonate-Vitamin D (CALCIUM 500 + D PO) Take 1 tablet by mouth daily after breakfast.      cetirizine (ZYRTEC) 10 MG tablet Take 10 mg by mouth daily.       diclofenac sodium (VOLTAREN) 1 % GEL Apply 4 g topically 4 (four) times daily as needed. 200 g 1   EPINEPHRINE 0.3 mg/0.3 mL IJ SOAJ injection INJECT 1 SYRINGE IN THE MUSCLE AS NEEDED 2 each 0   Erenumab-aooe (AIMOVIG) 140 MG/ML SOAJ Inject 140 mg into the skin every 30 (thirty) days. 1 mL 11   ketorolac (TORADOL) 60 MG/2ML SOLN injection Inject 1-19ml (30-60mg ) intramuscularly at onset of migraine. May repeat in 6 hours. Max twice a day and 4 days per month. 10 mL 4   lamoTRIgine (LAMICTAL) 150 MG tablet Take 300 mg by mouth daily.     metFORMIN (GLUCOPHAGE) 500 MG tablet Take 500 mg by mouth daily with breakfast.     Multiple Vitamin (MULTI-VITAMIN PO) Take 1 tablet by mouth daily after breakfast.      Norethin-Eth Estradiol-Fe (WYMZYA FE) 0.4-35 MG-MCG tablet Chew 1 tablet by mouth daily.      NURTEC 75 MG TBDP DISSOLVE ONE TABLET BY  MOUTH DAILY AS NEEDED FOR MIGRAINES. TAKE AS CLOSE TO ONSET OF MIGRAINE AS POSSIBLE. 1 TABLET DAILY MAXIMUM 8 tablet 6   ondansetron (ZOFRAN-ODT) 4 MG disintegrating tablet Take 1 tablet (4 mg total) by mouth every 8 (eight) hours as needed for nausea. May take for migraine as well. 30 tablet 3   Probiotic Product (PROBIOTIC PO) Take 1 capsule by mouth every morning.      SYRINGE-NEEDLE, DISP, 3 ML (B-D 3CC LUER-LOK SYR 23GX1") 23G X 1" 3 ML MISC TO USE WITH KETOROLAC IN THE MUSCLE 10 each 0   valACYclovir (VALTREX) 1000 MG tablet TAKE 2 TABLETS BY MOUTH TWICE DAILY OR AS DIRECTED 15 tablet 0   No facility-administered medications prior to visit.    PAST MEDICAL HISTORY: Past Medical History:  Diagnosis Date   Anxiety    Counseling and psychiatry   Chronic headaches    Sees specialist   Dyspepsia    Fatigue  Goiter    Hashimoto's disease    Hashimoto's thyroiditis    History of lower GI bleeding    History of recurrent UTIs    Sees urologist   History of shingles    face right    Hx of varicella    Hypothyroidism, acquired, autoimmune    IBS (irritable bowel syndrome)    Obesity    Oligomenorrhea    PCOS (polycystic ovarian syndrome)    PUD (peptic ulcer disease)    Thyroid disease     PAST SURGICAL HISTORY: Past Surgical History:  Procedure Laterality Date   BAND HEMORRHOIDECTOMY     KNEE SURGERY     x 2 femoral.  break  sp fall  7th grade    knees     WISDOM TOOTH EXTRACTION      FAMILY HISTORY: Family History  Problem Relation Age of Onset   Breast cancer Maternal Grandmother    Colon cancer Maternal Grandmother    Colon polyps Maternal Grandmother    Cancer Maternal Grandmother    Diabetes Paternal Grandfather    Lupus Paternal Grandmother    Thyroid disease Paternal Grandmother        Hypothyroid without surgery or irradiation.   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 apartment. 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 Will be difficult to go back financially.      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  Generalized: Well developed, in no acute distress   Neurological examination  Mentation: Alert oriented to time, place, history taking. Follows all commands speech and language fluent Cranial nerve II-XII: Pupils were equal round reactive to light. Extraocular movements were full, visual field  were full on confrontational test. Facial sensation and strength were normal. . Head turning and  shoulder shrug  were normal and symmetric. Motor: The motor testing reveals 5 over 5 strength of all 4 extremities. Good symmetric motor tone is noted throughout.  Sensory: Sensory testing is intact to soft touch on all 4 extremities. No evidence of extinction is noted.  Coordination: Cerebellar testing reveals good finger-nose-finger and heel-to-shin bilaterally.  Gait and station: Gait is normal. Tandem gait is normal. Romberg is negative. No drift is seen.  Reflexes: Deep tendon reflexes are symmetric and normal bilaterally.   DIAGNOSTIC DATA (LABS, IMAGING, TESTING) - I reviewed patient records, labs, notes, testing and imaging myself where available.  Lab Results  Component Value Date   WBC 7.1 01/12/2016   HGB 13.5 01/12/2016   HCT 40.8 01/12/2016   MCV 91.9 01/12/2016   PLT 268 01/12/2016      Component Value Date/Time   NA 135 01/12/2016 2026   K 4.2 01/12/2016 2026   CL 103 01/12/2016 2026   CO2 25 01/12/2016 2026   GLUCOSE 92 01/12/2016 2026   BUN 5 (L) 01/12/2016 2026   CREATININE 0.80 01/12/2016 2026   CREATININE 0.83 03/02/2012 0840   CALCIUM 9.5 01/12/2016 2026   CALCIUM 9.2 03/02/2012 0840   PROT 6.9 01/12/2016 2026   ALBUMIN 3.7 01/12/2016 2026   AST 18 01/12/2016 2026   ALT 15 01/12/2016 2026   ALKPHOS 72 01/12/2016 2026   BILITOT 0.5 01/12/2016 2026   GFRNONAA >60 01/12/2016 2026   GFRAA >60 01/12/2016 2026   No results found for: CHOL, HDL, LDLCALC, LDLDIRECT, TRIG, CHOLHDL Lab Results  Component Value Date   HGBA1C 4.4 12/23/2016   No results found for: VITAMINB12 Lab Results  Component Value Date   TSH 1.35 04/22/2021      ASSESSMENT AND PLAN 30 y.o. year old female  has a past medical history of Anxiety, Chronic headaches, Dyspepsia, Fatigue, Goiter, Hashimoto's disease, Hashimoto's thyroiditis, History of lower GI bleeding, History of recurrent UTIs, History of shingles, varicella, Hypothyroidism, acquired, autoimmune, IBS (irritable bowel  syndrome), Obesity, Oligomenorrhea, PCOS (polycystic ovarian syndrome), PUD (peptic ulcer disease), and Thyroid disease. here with:  1.  Migraine headaches  Continue Aimovig monthly injection Continue Nurtec for abortive therapy Can use Toradol injection if Nurtec is not helpful Follow-up in 1 year or sooner if needed     Butch Penny, MSN, NP-C 07/16/2021, 11:17 AM [facility] [address] [place], [place] [address] [phone]

## 2021-07-20 ENCOUNTER — Other Ambulatory Visit: Payer: Self-pay | Admitting: Neurology

## 2021-07-20 DIAGNOSIS — G43709 Chronic migraine without aura, not intractable, without status migrainosus: Secondary | ICD-10-CM

## 2021-08-19 ENCOUNTER — Telehealth: Payer: Self-pay | Admitting: Adult Health

## 2021-08-19 NOTE — Telephone Encounter (Addendum)
-  CMM KEY [id number] for aimovig140mg /ml determination pending 72-120 hours. (Has tried  nurtec, toradaol, maxalt, imitrex ubrelvy and lamictal.).

## 2021-08-19 NOTE — Telephone Encounter (Signed)
Because of the PA needed on YSA:YTKZSWFU-XNAT (AIMOVIG) 140 MG/ML SOAJ FT:[id number] has called to give new insurance info [employer] RK:[id number] Plan HMO [employer]: [phone] [facility] plan : YES

## 2021-08-25 NOTE — Telephone Encounter (Addendum)
Based on the information provided, I am unable to approve coverage for this medication because: °There is no documentation that the patient meets one of the following (i, ii, or iii): i. Inadequate  °response following a minimum 8 week trial of two migraine prevention therapies from different  °classes of medications including the following: a. Angiotensin receptor blockers or angiotensin  °converting enzyme inhibitors; b. Antidepressants; c. Antiepileptic drugs; d. Beta-blockers; ii.  °Contraindication or intolerance to all of the following: angiotensin receptor blockers/angiotensin  °converting enzyme inhibitors, antidepressants, antiepileptic drugs, and beta-blockers; or iii.  °Inadequate response, contraindication, or intolerance to a minimum 6 month trial of  °onabotulinumtoxinA (Botox) for chronic migraine prevention [requires prior authorization]. °Erenumab-aooe (Aimovig) is considered medically necessary when the following is met: Migraine  °Headache Prevention. Individual meets all of the following criteria: A. Age 18 years or older; B. 4 or  °more migraine headache days per month (prior to initiating Aimovig); C. Documentation of one of  °the following (i, ii, or iii): i. Inadequate response following a minimum 8 week trial of two migraine  °prevention therapies from different classes of medications including the following: a. Angiotensin  °receptor blockers or angiotensin converting enzyme inhibitors; b. Antidepressants; c. Antiepileptic  °drugs; d. Beta-blockers; ii. Contraindication or intolerance to all of the following: angiotensin  °receptor blockers/angiotensin converting enzyme inhibitors, antidepressants, antiepileptic drugs,  °and beta-blockers; or iii. Inadequate response, contraindication, or intolerance to a minimum 6 months of BOTOX. °

## 2021-08-27 NOTE — Telephone Encounter (Signed)
We can send note to [facility] to request reconsideration. Fax number is [phone].

## 2021-09-02 NOTE — Telephone Encounter (Signed)
Printed office note and faxed to [facility] requesting reconsideration of Aimovig denial. Faxed to [phone]. Received a receipt of confirmation.

## 2021-09-15 ENCOUNTER — Encounter: Payer: Self-pay | Admitting: *Deleted

## 2021-09-30 ENCOUNTER — Telehealth: Payer: Self-pay

## 2021-09-30 NOTE — Telephone Encounter (Signed)
I have submitted a PA request for Aimovig 140mg /mL on CMM, Key: [id number]. Awaiting determination from [employer].

## 2021-10-06 NOTE — Telephone Encounter (Signed)
That key is no longer open. We are still waiting on a determination for KEY: [id number]. The last PA was denied and we sent another request in.

## 2021-10-06 NOTE — Telephone Encounter (Signed)
[facility]) Can give [place] a call if you need any assistance with PA for Aimovig.  Reference key: [id number]  [phone]

## 2021-10-22 NOTE — Telephone Encounter (Signed)
Denial has been received on this med, appeal letter formulated for MD to review and process.  ?

## 2021-10-22 NOTE — Telephone Encounter (Signed)
Appeal letter has been signed and faxed to [facility] appeals along with last office note. Received a receipt of confirmation. ? ?

## 2021-10-28 NOTE — Telephone Encounter (Signed)
Spoke to [employer] Rep today .Jasmine December) Rep informed me that Aimovig for patient has been approved. Approval Dates 10/27/2021-10/28/2022  ?SR# [id number] [employer] Rep informed me there will be a approval letter going out in the next 5-7 business days . The letter will be mailed to patient and here at the office . ?

## 2022-01-18 ENCOUNTER — Encounter (HOSPITAL_BASED_OUTPATIENT_CLINIC_OR_DEPARTMENT_OTHER): Payer: Self-pay | Admitting: Pediatrics

## 2022-01-18 ENCOUNTER — Emergency Department (HOSPITAL_BASED_OUTPATIENT_CLINIC_OR_DEPARTMENT_OTHER)
Admission: EM | Admit: 2022-01-18 | Discharge: 2022-01-18 | Disposition: A | Discharge status: Home / Self Care | Payer: Commercial Managed Care - HMO | Attending: Emergency Medicine | Admitting: Emergency Medicine

## 2022-01-18 ENCOUNTER — Emergency Department (HOSPITAL_BASED_OUTPATIENT_CLINIC_OR_DEPARTMENT_OTHER): Payer: Commercial Managed Care - HMO

## 2022-01-18 ENCOUNTER — Other Ambulatory Visit: Payer: Self-pay

## 2022-01-18 ENCOUNTER — Telehealth: Payer: Self-pay | Admitting: Adult Health

## 2022-01-18 DIAGNOSIS — G43009 Migraine without aura, not intractable, without status migrainosus: Secondary | ICD-10-CM | POA: Diagnosis not present

## 2022-01-18 DIAGNOSIS — E039 Hypothyroidism, unspecified: Secondary | ICD-10-CM | POA: Diagnosis not present

## 2022-01-18 DIAGNOSIS — G43909 Migraine, unspecified, not intractable, without status migrainosus: Secondary | ICD-10-CM | POA: Diagnosis present

## 2022-01-18 LAB — PREGNANCY, URINE: Preg Test, Ur: NEGATIVE

## 2022-01-18 MED ORDER — ONDANSETRON HCL 4 MG/2ML IJ SOLN
4.0000 mg | Freq: Once | INTRAMUSCULAR | Status: AC
Start: 2022-01-18 — End: 2022-01-18
  Administered 2022-01-18: 4 mg via INTRAVENOUS

## 2022-01-18 MED ORDER — KETOROLAC TROMETHAMINE 30 MG/ML IJ SOLN
30.0000 mg | Freq: Once | INTRAMUSCULAR | Status: AC
Start: 2022-01-18 — End: 2022-01-18
  Administered 2022-01-18: 30 mg via INTRAVENOUS
  Filled 2022-01-18: qty 1

## 2022-01-18 MED ORDER — SODIUM CHLORIDE 0.9 % IV BOLUS
500.0000 mL | Freq: Once | INTRAVENOUS | Status: AC
  Administered 2022-01-18: 500 mL via INTRAVENOUS

## 2022-01-18 MED ORDER — MAGNESIUM SULFATE 50 % IJ SOLN
1.0000 g | Freq: Once | INTRAMUSCULAR | Status: AC
  Administered 2022-01-18: 1 g via INTRAVENOUS
  Filled 2022-01-18: qty 2

## 2022-01-18 NOTE — Telephone Encounter (Signed)
Spoke to patient gave Dr Celene Squibb recommendation to go to ER . Dr.Ahern offered her a appointment tomorrow but told patient Per  Dr Celene Squibb first recommendation  is to go to ER due to Vision changes . Pt took appointment for tomorrow . Explained to patient again we are happy to see her in office however  the ER is acute  care and they are able to give more detailed testing due to her vision changes  Pt still wants to come in tomorrow for appointment. Pt thanked me for calling her back.

## 2022-01-18 NOTE — Telephone Encounter (Signed)
LVM for patient to call me back to discuss 3 day migraine

## 2022-01-18 NOTE — ED Triage Notes (Signed)
C/O migraine headache x 3.5 days; unable to get relief from prescription regimen at home. C/O pressure on left eye along w/ some vision changes for sometime over a month,

## 2022-01-18 NOTE — ED Provider Notes (Signed)
Emergency Department Provider Note   I have reviewed the triage vital signs and the nursing notes.   HISTORY  Chief Complaint Migraine   HPI ICESIS RENN is a 31 y.o. female with PMH of migraine HA presents to the ED typical migraine HA but more severe and not improving over the last several days. She notes some associated left lateral visual disturbance not typical of her aura. The visual disturbance is maxing and waning. No numbness/weakness. No speech disturbance. No fever. She spoke with her Neurologist who recommended ED evaluation and head imaging. Home migraine medications not working.    Past Medical History:  Diagnosis Date   Anxiety    Counseling and psychiatry   Chronic headaches    Sees specialist   Dyspepsia    Fatigue    Goiter    Hashimoto's disease    Hashimoto's thyroiditis    History of lower GI bleeding    History of recurrent UTIs    Sees urologist   History of shingles    face right    Hx of varicella    Hypothyroidism, acquired, autoimmune    IBS (irritable bowel syndrome)    Obesity    Oligomenorrhea    PCOS (polycystic ovarian syndrome)    PUD (peptic ulcer disease)    Thyroid disease     Review of Systems  Constitutional: No fever/chills Eyes: Positive visual changes. ENT: No sore throat. Cardiovascular: Denies chest pain. Respiratory: Denies shortness of breath. Gastrointestinal: No abdominal pain.  No nausea, no vomiting.  No diarrhea.  No constipation. Genitourinary: Negative for dysuria. Musculoskeletal: Negative for back pain. Skin: Negative for rash. Neurological: Negative for focal weakness or numbness. Positive HA.    ____________________________________________   PHYSICAL EXAM:  VITAL SIGNS: ED Triage Vitals  Enc Vitals Group     BP 01/18/22 1857 122/83     Pulse Rate 01/18/22 1857 84     Resp 01/18/22 1857 18     Temp 01/18/22 1857 98.5 F (36.9 C)     Temp Source 01/18/22 1857 Oral     SpO2 01/18/22 1857  100 %     Weight 01/18/22 1859 145 lb (65.8 kg)     Height 01/18/22 1859 5\' 5"  (1.651 m)    Constitutional: Alert and oriented. Well appearing and in no acute distress. Eyes: Conjunctivae are normal. PERRL. EOMI. Normal non-dilated fundoscopic exam at bedside. Positive photophobia.  Head: Atraumatic. Nose: No congestion/rhinnorhea. Mouth/Throat: Mucous membranes are moist.  Neck: No stridor.   Cardiovascular: Normal rate, regular rhythm. Good peripheral circulation. Grossly normal heart sounds.   Respiratory: Normal respiratory effort.  No retractions. Lungs CTAB. Gastrointestinal: Soft and nontender. No distention.  Musculoskeletal: No lower extremity tenderness nor edema. No gross deformities of extremities. Neurologic:  Normal speech and language. No gross focal neurologic deficits are appreciated.  Skin:  Skin is warm, dry and intact. No rash noted.  ____________________________________________   LABS (all labs ordered are listed, but only abnormal results are displayed)  Labs Reviewed  PREGNANCY, URINE   ____________________________________________  RADIOLOGY  CT Head Wo Contrast  Result Date: 01/18/2022 CLINICAL DATA:  Sudden onset headaches EXAM: CT HEAD WITHOUT CONTRAST TECHNIQUE: Contiguous axial images were obtained from the base of the skull through the vertex without intravenous contrast. RADIATION DOSE REDUCTION: This exam was performed according to the departmental dose-optimization program which includes automated exposure control, adjustment of the mA and/or kV according to patient size and/or use of iterative reconstruction technique. COMPARISON:  None Available. FINDINGS: Brain: No evidence of acute infarction, hemorrhage, hydrocephalus, extra-axial collection or mass lesion/mass effect. Vascular: No hyperdense vessel or unexpected calcification. Skull: Normal. Negative for fracture or focal lesion. Sinuses/Orbits: No acute finding. Other: None. IMPRESSION: No acute  intracranial abnormality noted. Electronically Signed   By: Alcide Clever M.D.   On: 01/18/2022 21:32    ____________________________________________   PROCEDURES  Procedure(s) performed:   Procedures  None ____________________________________________   INITIAL IMPRESSION / ASSESSMENT AND PLAN / ED COURSE  Pertinent labs & imaging results that were available during my care of the patient were reviewed by me and considered in my medical decision making (see chart for details).   This patient is Presenting for Evaluation of HA, which does require a range of treatment options, and is a complaint that involves a high risk of morbidity and mortality.  The Differential Diagnoses  includes but is not exclusive to subarachnoid hemorrhage, meningitis, encephalitis, previous head trauma, cavernous venous thrombosis, muscle tension headache, glaucoma, temporal arteritis, migraine or migraine equivalent, etc.   Critical Interventions-    Medications  magnesium sulfate (IV Push/IM) injection 1 g (1 g Intravenous Given 01/18/22 2038)  ketorolac (TORADOL) 30 MG/ML injection 30 mg (30 mg Intravenous Given 01/18/22 2039)  ondansetron (ZOFRAN) injection 4 mg (4 mg Intravenous Given 01/18/22 2038)  sodium chloride 0.9 % bolus 500 mL (0 mLs Intravenous Stopped 01/18/22 2130)    Reassessment after intervention: HA greatly improved.    I did obtain Additional Historical Information from family at bedside.  I decided to review pertinent External Data, and in summary patient followed by Neurology in the [facility] system.   Clinical Laboratory Tests Ordered, included pregnancy testing which is negative.   Radiologic Tests Ordered, included CT head. I independently interpreted the images and agree with radiology interpretation.   Social Determinants of Health Risk no smoking history.   Medical Decision Making: Summary:  Patient presents to the ED with HA and left side vision deficits. No apparent deficit here  on my exam. Vision symptoms wax and wane with HA severity. Suspect aura related vision disturbance. Presentation not consistent with infectious etiology of HA.   Reevaluation with update and discussion with patient. HA resolved with migraine cocktail and vision normal. Plan for Neurology follow up with appointment scheduled for tomorrow.   Disposition: discharge  ____________________________________________  FINAL CLINICAL IMPRESSION(S) / ED DIAGNOSES  Final diagnoses:  Migraine without status migrainosus, not intractable, unspecified migraine type     Note:  This document was prepared using Dragon voice recognition software and may include unintentional dictation errors.  Alona Bene, MD, [facility]    Mescal Flinchbaugh, Arlyss Repress, MD 01/19/22 1120

## 2022-01-18 NOTE — ED Notes (Signed)
Patient given discharge instructions, all questions answered. Patient in possession of all belongings, directed to the discharge area  

## 2022-01-18 NOTE — Discharge Instructions (Signed)

## 2022-01-18 NOTE — Telephone Encounter (Signed)
Pt said just now feeling relief from a 3-day migraine, with level of pain an 8, some vision loss left eye, neck pain.   Schedule appt with Dr. Lucia Gaskins for a sooner appt on 02/17/22.

## 2022-01-19 ENCOUNTER — Telehealth: Payer: Self-pay

## 2022-01-19 ENCOUNTER — Encounter: Payer: Self-pay | Admitting: Neurology

## 2022-01-19 ENCOUNTER — Ambulatory Visit: Payer: Commercial Managed Care - HMO | Admitting: Neurology

## 2022-01-19 VITALS — BP 114/81 | HR 72 | Ht 65.0 in | Wt 148.0 lb

## 2022-01-19 DIAGNOSIS — G43709 Chronic migraine without aura, not intractable, without status migrainosus: Secondary | ICD-10-CM | POA: Diagnosis not present

## 2022-01-19 MED ORDER — GALCANEZUMAB-GNLM 120 MG/ML ~~LOC~~ SOAJ: 240.0000 mg | Freq: Once | SUBCUTANEOUS | Status: AC

## 2022-01-19 MED ORDER — EMGALITY 120 MG/ML ~~LOC~~ SOAJ: 120.0000 mg | SUBCUTANEOUS | 11 refills | Status: DC

## 2022-01-19 MED ORDER — ELYXYB 120 MG/4.8ML PO SOLN: 120.0000 mg | Freq: Every day | ORAL | Status: DC | PRN

## 2022-01-19 MED ORDER — EMGALITY 120 MG/ML ~~LOC~~ SOAJ: 120.0000 mg | SUBCUTANEOUS | 0 refills | Status: DC

## 2022-01-19 NOTE — Patient Instructions (Addendum)
Brand: SomniLight Light Therapy or theraspecs, sunglasses FL-41 Light Sensitivity Glasses for Light Sensitivity, Photophobia, and Migraines  Also can get tinted window - I can fill out the form  Rescue: Take nurtec with Elyxyb and ondansetron - if you like it mychart [place] and we can prescribe remember the copay card Preventative: switch to Emgality - injected today, gave 2 months samples Try magnesium (mag oxide, mag citrate, any mag that works best for you) 400-600mg /day for aura  There is increased risk for stroke in women with migraine with aura and a contraindication for the combined contraceptive pill for use by women who have migraine with aura. The risk for women with migraine without aura is lower. However other risk factors like smoking are far more likely to increase stroke risk than migraine. There is a recommendation for no smoking and for the use of OCPs without estrogen such as progestogen only pills particularly for women with migraine with aura.[name] People who have migraine headaches with auras may be 3 times more likely to have a stroke caused by a blood clot, compared to migraine patients who don't see auras. Women who take hormone-replacement therapy may be 30 percent more likely to suffer a clot-based stroke than women not taking medication containing estrogen. Other risk factors like smoking and high blood pressure may be  much more important  Galcanezumab Injection What is this medication? GALCANEZUMAB (gal ka NEZ ue mab) prevents migraines. It works by blocking a substance in the body that causes migraines. It may also be used to treat cluster headaches. It is a monoclonal antibody. This medicine may be used for other purposes; ask your health care provider or pharmacist if you have questions. COMMON BRAND NAME(S): Emgality What should I tell my care team before I take this medication? They need to know if you have any of these conditions: An unusual or allergic reaction to  galcanezumab, other medications, foods, dyes, or preservatives Pregnant or trying to get pregnant Breast-feeding How should I use this medication? This medication is injected under the skin. You will be taught how to prepare and give it. Take it as directed on the prescription label. Keep taking it unless your care team tells you to stop. It is important that you put your used needles and syringes in a special sharps container. Do not put them in a trash can. If you do not have a sharps container, call your pharmacist or care team to get one. Talk to your care team about the use of this medication in children. Special care may be needed. Overdosage: If you think you have taken too much of this medicine contact a poison control center or emergency room at once. NOTE: This medicine is only for you. Do not share this medicine with others. What if I miss a dose? If you miss a dose, take it as soon as you can. If it is almost time for your next dose, take only that dose. Do not take double or extra doses. What may interact with this medication? Interactions are not expected. This list may not describe all possible interactions. Give your health care provider a list of all the medicines, herbs, non-prescription drugs, or dietary supplements you use. Also tell them if you smoke, drink alcohol, or use illegal drugs. Some items may interact with your medicine. What should I watch for while using this medication? Visit your care team for regular checks on your progress. Tell your care team if your symptoms do not start to  get better or if they get worse. What side effects may I notice from receiving this medication? Side effects that you should report to your care team as soon as possible: Allergic reactions or angioedema--skin rash, itching or hives, swelling of the face, eyes, lips, tongue, arms, or legs, trouble swallowing or breathing Side effects that usually do not require medical attention (report to  your care team if they continue or are bothersome): Pain, redness, or irritation at injection site This list may not describe all possible side effects. Call your doctor for medical advice about side effects. You may report side effects to FDA at 1-800-FDA-1088. Where should I keep my medication? Keep out of the reach of children and pets. Store in a refrigerator or at room temperature between 20 and 25 degrees C (68 and 77 degrees F). Refrigeration (preferred): Store in the refrigerator. Do not freeze. Keep in the original container until you are ready to take it. Remove the dose from the carton about 30 minutes before it is time for you to use it. If the dose is not used, it may be stored in original container at room temperature for 7 days. Get rid of any unused medication after the expiration date. Room Temperature: This medication may be stored at room temperature for up to 7 days. Keep it in the original container. Protect from light until time of use. If it is stored at room temperature, get rid of any unused medication after 7 days or after it expires, whichever is first. To get rid of medications that are no longer needed or have expired: Take the medication to a medication take-back program. Check with your pharmacy or law enforcement to find a location. If you cannot return the medication, ask your pharmacist or care team how to get rid of this medication safely. NOTE: This sheet is a summary. It may not cover all possible information. If you have questions about this medicine, talk to your doctor, pharmacist, or health care provider.  2023 Elsevier/Gold Standard (2021-09-28 00:00:00)   Celecoxib Solution What is this medication? CELECOXIB (sell a KOX ib) treats mild to moderate pain, inflammation, or arthritis. It belongs to a group of medications called NSAIDs. This medicine may be used for other purposes; ask your health care provider or pharmacist if you have questions. COMMON  BRAND NAME(S): [id number] What should I tell my care team before I take this medication? They need to know if you have any of these conditions: Cigarette smoker Coronary artery bypass graft (CABG) surgery within the past 2 weeks Drink more than 3 alcohol-containing drinks a day Heart disease High blood pressure History of stomach bleeding Kidney disease Liver disease Lung or breathing disease, like asthma An unusual or allergic reaction to celecoxib, sulfa medications, aspirin, other NSAIDs, other medications, foods, dyes, or preservatives Pregnant or trying to get pregnant Breast-feeding How should I use this medication? Take this medication by mouth. Follow the directions on the prescription label. Use a specially marked oral syringe, spoon, or dropper to measure each dose. Ask your pharmacist if you do not have one. Household spoons are not accurate. A special MedGuide will be given to you by the pharmacist with each prescription and refill. Be sure to read this information carefully each time. Talk to your care team regarding the use of this medication in children. Special care may be needed. Overdosage: If you think you have taken too much of this medicine contact a poison control center or emergency room at once.  NOTE: This medicine is only for you. Do not share this medicine with others. What if I miss a dose? This does not apply. This medication is not for regular use. What may interact with this medication? Do not take this medication with any of the following: Cidofovir Ketorolac Thioridazine This medication may also interact with the following: Alcohol Aspirin and aspirin-like medications Atomoxetine Certain medications for blood pressure, heart disease, irregular heart beat Certain medications for depression, anxiety, or psychotic disorders Certain medications that treat or prevent blood clots like warfarin, enoxaparin, dalteparin, apixaban, dabigatran, and  rivaroxaban Cyclosporine Digoxin Diuretics Fluconazole Lithium Methotrexate Other NSAIDs, medications for pain and inflammation, like ibuprofen or naproxen Pemetrexed Rifampin Steroid medications like prednisone or cortisone This list may not describe all possible interactions. Give your health care provider a list of all the medicines, herbs, non-prescription drugs, or dietary supplements you use. Also tell them if you smoke, drink alcohol, or use illegal drugs. Some items may interact with your medicine. What should I watch for while using this medication? Visit your care team for regular checks on your progress. Tell your care team if your symptoms do not start to get better or if they get worse. Do not take other medications that contain aspirin, ibuprofen, or naproxen with this medication. Side effects such as stomach upset, nausea, or ulcers may be more likely to occur. Many non-prescription medications contain aspirin, ibuprofen, or naproxen. Always read labels carefully. This medication can cause serious ulcers and bleeding in the stomach. It can happen with no warning. Smoking, drinking alcohol, older age, and poor health can also increase risks. Call your care team right away if you have stomach pain or blood in your vomit or stool. This medication does not prevent a heart attack or stroke. This medication may increase the chance of a heart attack or stroke. The chance may increase the longer you use this medication or if you have heart disease. If you take aspirin to prevent a heart attack or stroke, talk to your care team about using this medication. Alcohol may interfere with the effect of this medication. Avoid alcoholic drinks. This medication may cause serious skin reactions. They can happen weeks to months after starting the medication. Contact your care team right away if you notice fevers or flu-like symptoms with a rash. The rash may be red or purple and then turn into blisters  or peeling of the skin. Or, you might notice a red rash with swelling of the face, lips or lymph nodes in your neck or under your arms. Talk to your care team if you are pregnant before taking this medication. Taking this medication between weeks 20 and 30 of pregnancy may harm your unborn baby. Your care team will monitor you closely if you need to take it. After 30 weeks of pregnancy, do not take this medication. You may get drowsy or dizzy. Do not drive, use machinery, or do anything that needs mental alertness until you know how this medication affects you. Do not stand up or sit up quickly, especially if you are an older patient. This reduces the risk of dizzy or fainting spells. Be careful brushing or flossing your teeth or using a toothpick because you may get an infection or bleed more easily. If you have any dental work done, tell your dentist you are receiving this medication. This medication may make it more difficult to get pregnant. Talk to your care team if you are concerned about your fertility. What side  effects may I notice from receiving this medication? Side effects that you should report to your care team as soon as possible: Allergic reactions--skin rash, itching, hives, swelling of the face, lips, tongue, or throat Bleeding--bloody or black, tar-like stools, vomiting blood or brown material that looks like coffee grounds, red or dark brown urine, small red or purple spots on skin, unusual bruising or bleeding Heart attack--pain or tightness in the chest, shoulders, arms, or jaw, nausea, shortness of breath, cold or clammy skin, feeling faint or lightheaded Heart failure--shortness of breath, swelling of ankles, feet, or hands, sudden weight gain, unusual weakness or fatigue Increase in blood pressure Kidney injury--decrease in the amount of urine, swelling of the ankles, hands, or feet Liver injury--right upper belly pain, loss of appetite, nausea, light-colored stool, dark yellow  or brown urine, yellowing skin or eyes, unusual weakness or fatigue Rash, fever, and swollen lymph nodes Redness, blistering, peeling, or loosening of the skin, including inside the mouth Stroke--sudden numbness or weakness of the face, arm, or leg, trouble speaking, confusion, trouble walking, loss of balance or coordination, dizziness, severe headache, change in vision Side effects that usually do not require medical attention (report to your care team if they continue or are bothersome): Change in taste Headache Loss of appetite Nausea Upset stomach This list may not describe all possible side effects. Call your doctor for medical advice about side effects. You may report side effects to FDA at 1-800-FDA-1088. Where should I keep my medication? Keep out of the reach of children and pets. Store at room temperature between 15 and 30 degrees C (59 and 86 degrees F). Get rid of any unused medication after the expiration date. To get rid of medications that are no longer needed or have expired: Take the medication to a medication take-back program. Check with your pharmacy or law enforcement to find a location. If you cannot return the medication, check the label or package insert to see if the medication should be thrown out in the garbage or flushed down the toilet. If you are not sure, ask your care team. If it is safe to put it in the trash, empty the medication out of the container. Mix the medication with cat litter, dirt, coffee grounds, or other unwanted substance. Seal the mixture in a bag or container. Put it in the trash. NOTE: This sheet is a summary. It may not cover all possible information. If you have questions about this medicine, talk to your doctor, pharmacist, or health care provider.  2023 [employer]vier/Gold Standard (2020-08-29 00:00:00)

## 2022-01-19 NOTE — Progress Notes (Signed)
[facility]    Provider:  Dr Jaynee [name] Requesting Provider: Regis Bill Standley Brooking, MD Primary Care Provider:  Burnis Medin, MD  CC:  Migraines  01/19/2022: last seen was doing great on aimovig with nurtec and toradol for acute management. She switched to [place] and didn't have aimovig through march. So there was a lapse, and started it again in march and she has some stress in the last 1.5 months and sleeping poorly and triggers. She had a bad migraine and went to ED. The light at work triggers her migraines, discussed somnilight and theraspecs and sleeping.  she also has less periods taking . Also having problems with more auras. She has nurtec and it works 50% of the time. Roselyn Meier makes her sleepy and it did not work better than the nurtec. Toradol works. Will give her elyxyb as a trial to use with nurtec. Baseline has daily headaches. She has easily 8 migraine days a month and they moderately severe to severe and interfering to her   CT 01/19/2022 showed No acute intracranial abnormalities including mass lesion or mass effect, hydrocephalus, extra-axial fluid collection, midline shift, hemorrhage, or acute infarction, large ischemic events (personally reviewed images)  Patient complains of symptoms per HPI as well as the following symptoms: migraine aura . Pertinent negatives and positives per HPI. All others negative   07/16/2021: Catherine Givens NP: [name] is a 31 year old female with a history of migraine headaches.  She returns today for follow-up.  She reports that Aimovig continues to work well for her.  She takes Nurtec for abortive therapy will use Toradol if Nurtec is not beneficial.  She has approximately 2 headaches a month that typically occur around her menstrual cycle  meds tried: Lamictal, paxil, Topamax(gave her gout and kidney stones), amitriptyline, verapamil, tried other medications as well, maxalt, imitrex(side effects),   Interval history 05/21/2020: Catherine [name] is  still working great. Roselyn Meier works fine. She could not get nurtec approved. Nurtec didn't cause tiredness but Ubrelvy did. The triptan had side effects, she has had side effects to multiple triptans.   Interval history 11/15/2019: She feels better, migraines cut in half from at least 8 to 4 migraine days a month. She is on extended birth control and that has helped as well. She has PCOS and this birth control helps. Nurtec helps 50% of the time. She is very happy with the Ajovy. The nurtec wokrs only a little bit, she has tried Recruitment consultant and possibly imitrex, will try zomig. May also try Tosymra or injectable imitrex. Can also try toradol.   HPI:  Catherine [name] is a 31 y.o. female here as requested by Catherine [name], Standley Brooking, MD for migraines sine the age of 16 since puberty. She has seen Catherine. Brett [name]. She was on multiple medications. She has tried multiple birth controls to see if this woul dhelp with her migraines. She has PCOS diagnosed at the age of 8. Migraines are very hormonal. She gets migraines a few days after starting her period. Over the last 6 months she has been having them all throughout the month not just at her period. 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 She tries to use Tylenol, doesn't help. She has sensitivity to light and migraine location varies between sides or both, she has migraines without aura and also with aura - the  aura is swirling lights and blocks but not all the time she also has migraines without aura. Endorses  photophobia, phonophbia, pounding, throbbing,nausea,vomiting. A dark cool room helps, moving makes it work, smells may trigger. Have lasted 24-48 hours. She has daily headaches. She has easily 8 migraine days a month and they moderately severe to severe and interfering to her life ongoing over 6 months. No other focal neurologic deficits, associated symptoms, inciting events or modifiable factors.   meds tried: Lamictal, paxil,  Topamax(gave her gout and kidney stones), amitriptyline, verapamil, tried other medications as well, maxalt, imitrex(side effects),   Reviewed notes, labs and imaging from outside physicians, which showed:  TSH/T4 Free 08/16/2017 normal  Reviewed notes from Catherine. Regis Bill, she has had migraines since a teen, mostly migraines during menstrual cycle but worsening over the years, more frequent. She has a remote hx of neurologic care, requested neurology, past meds not helping, kidney stones with topamax. 10 hours of sleep a night, 5 cats. 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 On Lamotrigine for psychiatric d/o.Reviewed examination, normal gen,heent,neck,lungs,CV,psych,musculoskeletal  I will request notes from [facility] where she was seen in the past.  Review of Systems: Patient complains of symptoms per HPI as well as the following symptoms: anxiety . Pertinent negatives and positives per HPI. 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 apartment. 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 Will be difficult to go back financially.      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   Lives with her parents    Right handed   Caffeine: 1 cup/day   Social Determinants of Radio broadcast assistant Strain: Not on file  Food Insecurity: Not on file  Transportation Needs: Not on file  Physical Activity: Not on file  Stress: Not on file  Social Connections: Not on file  Intimate Partner Violence: Not on file    Family History  Problem Relation Age of Onset   Breast cancer Maternal Grandmother    Colon cancer Maternal Grandmother    Colon polyps Maternal Grandmother    Cancer Maternal Grandmother    Diabetes Paternal Grandfather    Lupus Paternal Grandmother    Thyroid disease Paternal 24        Hypothyroid without surgery or irradiation.   Osteopenia Mother    Obesity Mother    Migraines Father        "severe atypical"   Migraines Sister     Past Medical History:  Diagnosis Date   Anxiety  Counseling and psychiatry   Chronic headaches    Sees specialist   Dyspepsia    Fatigue    Goiter    Hashimoto's disease    Hashimoto's thyroiditis    History of lower GI bleeding    History of recurrent UTIs    Sees urologist   History of shingles    face right    Hx of varicella    Hypothyroidism, acquired, autoimmune    IBS (irritable bowel syndrome)    Obesity    Oligomenorrhea    PCOS (polycystic ovarian syndrome)    PUD (peptic ulcer disease)    Thyroid disease     Patient Active Problem List   Diagnosis Date Noted   Chronic migraine without aura without status migrainosus, not intractable 05/20/2019   Migraine with aura and without status migrainosus, not intractable 05/20/2019   Bipolar 1 disorder, mixed, moderate ([place]) 06/16/2016   Mouth ulcer 05/31/2014   Left facial pain 05/31/2014   Herpes simplex labialis 04/17/2013   Vaginal lesions 10/06/2012   Gout attack possible.  09/05/2012   Foot swelling 09/05/2012   Hx of urinary stone 09/05/2012   Dermatitis 09/03/2012    Bright red rectal bleeding 09/01/2012   Diarrhea 09/01/2012   Adjustment disorder with anxiety 05/31/2012   Menstrual migraine 05/31/2012   Vitamin D deficiency 05/31/2012   Hx of fracture of leg 05/31/2012   History of recurrent UTIs    Oligomenorrhea    Goiter    Hashimoto's thyroiditis    Dyspepsia    Hypothyroidism, acquired, autoimmune    Fatigue    IBS (irritable bowel syndrome) 05/11/2011   PCOS (polycystic ovarian syndrome) 12/31/2010   Obesity 12/31/2010    Past Surgical History:  Procedure Laterality Date   BAND HEMORRHOIDECTOMY     KNEE SURGERY     x 2 femoral.  break  sp fall  7th grade    knees     WISDOM TOOTH EXTRACTION      Current Outpatient Medications  Medication Sig Dispense Refill   acetaminophen (TYLENOL) 650 MG CR tablet Take 650-1,300 mg by mouth every 8 (eight) hours as needed for pain.     ALPRAZolam (XANAX XR) 0.5 MG 24 hr tablet Take 0.5 mg by mouth 2 (two) times daily as needed for anxiety.      Calcium Carbonate-Vitamin D (CALCIUM 500 + D PO) Take 1 tablet by mouth daily after breakfast.      Celecoxib (ELYXYB) 120 MG/4.8ML SOLN Take 120 mg by mouth daily as needed. 9.6 mL ML   cetirizine (ZYRTEC) 10 MG tablet Take 10 mg by mouth daily.       EPINEPHRINE 0.3 mg/0.3 mL IJ SOAJ injection INJECT 1 SYRINGE IN THE MUSCLE AS NEEDED 2 each 0   Galcanezumab-gnlm (EMGALITY) 120 MG/ML SOAJ Inject 120 mg into the skin every 30 (thirty) days. 1.12 mL 11   ketorolac (TORADOL) 60 MG/2ML SOLN injection Inject 1-69ml (30-60mg ) intramuscularly at onset of migraine. May repeat in 6 hours. Max twice a day and 4 days per month. 10 mL 4   lamoTRIgine (LAMICTAL) 150 MG tablet Take 300 mg by mouth daily.     metFORMIN (GLUCOPHAGE) 500 MG tablet Take 500 mg by mouth daily with breakfast.     Multiple Vitamin (MULTI-VITAMIN PO) Take 1 tablet by mouth daily after breakfast.      ondansetron (ZOFRAN-ODT) 4 MG disintegrating tablet Take 1 tablet (4 mg total) by mouth every 8  (eight) hours as needed for  nausea. May take for migraine as well. 30 tablet 3   Probiotic Product (PROBIOTIC PO) Take 1 capsule by mouth every morning.      Rimegepant Sulfate (NURTEC) 75 MG TBDP DISSOLVE ONE TABLET BY MOUTH AS NEEDED FOR MIGRAINES. TAKE AS CLOSE TO ONSET OF MIGRAINE. ONE MAX PER DAY 8 tablet 11   SYRINGE-NEEDLE, DISP, 3 ML (B-D 3CC LUER-LOK SYR 23GX1") 23G X 1" 3 ML MISC TO USE WITH KETOROLAC IN THE MUSCLE 10 each 0   valACYclovir (VALTREX) 1000 MG tablet TAKE 2 TABLETS BY MOUTH TWICE DAILY OR AS DIRECTED 15 tablet 0   VYFEMLA 0.4-35 MG-MCG tablet Take 1 tablet by mouth daily.     No current facility-administered medications for this visit.    Allergies as of 01/19/2022 - Review Complete 01/19/2022  Allergen Reaction Noted   Ibuprofen Other (See Comments) 03/01/2018   Milk-related compounds Anaphylaxis 01/20/2013   Onion Diarrhea and Nausea Only 01/12/2016   Penicillins Other (See Comments) 07/24/2011   Phenergan [promethazine]  05/21/2020    Vitals: BP 114/81 (BP Location: Left Arm, Patient Position: Sitting)   Pulse 72   Ht 5\' 5"  (1.651 m)   Wt 148 lb (67.1 kg)   BMI 24.63 kg/m  Last Weight:  Wt Readings from Last 1 Encounters:  01/19/22 148 lb (67.1 kg)   Last Height:   Ht Readings from Last 1 Encounters:  01/19/22 5\' 5"  (1.651 m)    Exam: NAD, pleasant                  Speech:    Speech is normal; fluent and spontaneous with normal comprehension.  Cognition:    The patient is oriented to person, place, and time;     recent and remote memory intact;     language fluent;    Cranial Nerves:    The pupils are equal, round, and reactive to light.Trigeminal sensation is intact and the muscles of mastication are normal. The face is symmetric. The palate elevates in the midline. Hearing intact. Voice is normal. Shoulder shrug is normal. The tongue has normal motion without fasciculations.   Coordination:  No dysmetria  Motor Observation:    No  asymmetry, no atrophy, and no involuntary movements noted. Tone:    Normal muscle tone.     Strength:    Strength is V/V in the upper and lower limbs.      Sensation: intact to LT       Assessment/Plan:  31 year old with Chronic Migraines without and with aura failed multiple classes of first-lime migraine preventative medications. meds tried: Lamictal, paxil, Topamax(gave her gout and kidney stones), amitriptyline, propranolol and other BP meds contraindicated due to  verapamil, tried other medications as well. Discussed options for acute and preventative treatments, oral medications, injections, botox. At baseline She has daily headaches and easily 8 migraine days a month and they are moderately severe to severe and interfering to her   - Aimovig not working well anymore, switch to [employer] (has only 4 migraine days a month when on a good cgrp medication with < 10 headache days like Ajovy but was declined by insurance try emgality) - Triptan with severe side effects, contraindicated. Ubrelvy with fatigue, can;t take.  - for light sensitivity: Brand: SomniLight Light Therapy or theraspecs, sunglasses FL-41 Light Sensitivity Glasses for Light Sensitivity, Photophobia, and Migraines. Also can get tinted window - I can fill out the form - Rescue: Take nurtec with Elyxyb and ondansetron - if  you like it mychart [place] and we can prescribe remember the copay card Preventative: switch to Emgality - injected today, gave 2 months samples Try magnesium (mag oxide, mag citrate, any mag that works best for you) 400-600mg /day for aura- Do not take elyxb with ketorolac, they are in the same class, discussed Administered loading dose emgality today, tolerated it well, provided training on applicator  Discussed: There is increased risk for stroke in women with migraine with aura and a contraindication for the combined contraceptive pill for use by women who have migraine with aura. The risk for women with  migraine without aura is lower. However other risk factors like smoking are far more likely to increase stroke risk than migraine. There is a recommendation for no smoking and for the use of OCPs without estrogen such as progestogen only pills particularly for women with migraine with aura.[name] People who have migraine headaches with auras may be 3 times more likely to have a stroke caused by a blood clot, compared to migraine patients who don't see auras. Women who take hormone-replacement therapy may be 30 percent more likely to suffer a clot-based stroke than women not taking medication containing estrogen. Other risk factors like smoking and high blood pressure may be  much more important.  Discussed: To prevent or relieve headaches, try the following: Cool Compress. Lie down and place a cool compress on your head.  Avoid headache triggers. If certain foods or odors seem to have triggered your migraines in the past, avoid them. A headache diary might help you identify triggers.  Include physical activity in your daily routine. Try a daily walk or other moderate aerobic exercise.  Manage stress. Find healthy ways to cope with the stressors, such as delegating tasks on your to-do list.  Practice relaxation techniques. Try deep breathing, yoga, massage and visualization.  Eat regularly. Eating regularly scheduled meals and maintaining a healthy diet might help prevent headaches. Also, drink plenty of fluids.  Follow a regular sleep schedule. Sleep deprivation might contribute to headaches Consider biofeedback. With this mind-body technique, you learn to control certain bodily functions -- such as muscle tension, heart rate and blood pressure -- to prevent headaches or reduce headache pain.    Proceed to emergency room if you experience new or worsening symptoms or symptoms do not resolve, if you have new neurologic symptoms or if headache is severe, or for any concerning symptom.   Provided education  and documentation from American headache Society toolbox including articles on: chronic migraine medication overuse headache, chronic migraines, prevention of migraines, behavioral and other nonpharmacologic treatments for headache.  Meds ordered this encounter  Medications   Galcanezumab-gnlm (EMGALITY) 120 MG/ML SOAJ    Sig: Inject 120 mg into the skin every 30 (thirty) days.    Dispense:  1.12 mL    Refill:  11   Celecoxib (ELYXYB) 120 MG/4.8ML SOLN    Sig: Take 120 mg by mouth daily as needed.    Dispense:  9.6 mL    Refill:  ML    Cc: Catherine [name], Standley Brooking, MD  Sarina Ill, MD  [facility] [address] [place], [place] [address]  Phone [phone] Fax [phone]  I spent 45 minutes of face-to-face and non-face-to-face time with patient on the  1. Chronic migraine without aura without status migrainosus, not intractable    diagnosis.  This included previsit chart review, lab review, study review, order entry, electronic health record documentation, patient education on the different diagnostic and  therapeutic options, counseling and coordination of care, risks and benefits of management, compliance, or risk factor reduction

## 2022-01-19 NOTE — Telephone Encounter (Signed)
Transition Care Management Unsuccessful Follow-up Telephone Call  Date of discharge and from where:  [facility] 01/18/22  Attempts:  1st Attempt  Reason for unsuccessful TCM follow-up call:  Left voice message  If pt calls back please schedule ED f/u appt if needed.

## 2022-01-20 ENCOUNTER — Telehealth: Payer: Self-pay | Admitting: *Deleted

## 2022-01-20 NOTE — Telephone Encounter (Signed)
I Intiated PA for [place] [id number] Emgality.   Determination pending.

## 2022-01-20 NOTE — Telephone Encounter (Signed)
Transition Care Management Unsuccessful Follow-up Telephone Call  Date of discharge and from where:  01/18/22 [facility]  Attempts:  2nd Attempt  Reason for unsuccessful TCM follow-up call:  Left voice message  If pt returns call, please schedule ED f/u with PCP.

## 2022-01-25 NOTE — Telephone Encounter (Addendum)
Received approval flor Emgality on CMM CaseId:[id number];Status:Approved;Review Type:Prior Auth;Coverage Start Date:01/06/2022;Coverage End Date:01/26/2023 per [employer].  Sent mychart email message to pt.  She should be able to receive from pharmacy.

## 2022-01-26 NOTE — Telephone Encounter (Signed)
Approval letter received from [place]. Letter faxed to pharmacy. Received a receipt of confirmation.

## 2022-02-17 ENCOUNTER — Ambulatory Visit: Payer: Self-pay | Admitting: Neurology

## 2022-04-06 ENCOUNTER — Other Ambulatory Visit: Payer: Self-pay | Admitting: Internal Medicine

## 2022-05-25 ENCOUNTER — Other Ambulatory Visit: Payer: Self-pay | Admitting: Adult Health

## 2022-06-30 ENCOUNTER — Ambulatory Visit (INDEPENDENT_AMBULATORY_CARE_PROVIDER_SITE_OTHER): Payer: Commercial Managed Care - HMO | Admitting: Family Medicine

## 2022-06-30 VITALS — BP 100/62 | HR 90 | Temp 98.9°F | Ht 65.0 in | Wt 157.0 lb

## 2022-06-30 DIAGNOSIS — J029 Acute pharyngitis, unspecified: Secondary | ICD-10-CM

## 2022-06-30 DIAGNOSIS — J069 Acute upper respiratory infection, unspecified: Secondary | ICD-10-CM | POA: Diagnosis not present

## 2022-06-30 LAB — POCT RAPID STREP A (OFFICE): Rapid Strep A Screen: NEGATIVE

## 2022-06-30 NOTE — Progress Notes (Signed)
Established Patient Office Visit  Subjective   Patient ID: Catherine [name], female    DOB: Jul 09, 1991  Age: 31 y.o. MRN: [id number]  Chief Complaint  Patient presents with   Sore Throat    X1 day, tried Guiafenesin and Tylenol with no relief    HPI   Patient seen with 1 day history of sore throat.  She also developed some nasal congestion yesterday.  No significant cough.  No documented fever.  She has had previous tonsillectomy.  Denies any nausea or vomiting.  No skin rash.  Past Medical History:  Diagnosis Date   Anxiety    Counseling and psychiatry   Chronic headaches    Sees specialist   Dyspepsia    Fatigue    Goiter    Hashimoto's disease    Hashimoto's thyroiditis    History of lower GI bleeding    History of recurrent UTIs    Sees urologist   History of shingles    face right    Hx of varicella    Hypothyroidism, acquired, autoimmune    IBS (irritable bowel syndrome)    Obesity    Oligomenorrhea    PCOS (polycystic ovarian syndrome)    PUD (peptic ulcer disease)    Thyroid disease    Past Surgical History:  Procedure Laterality Date   BAND HEMORRHOIDECTOMY     KNEE SURGERY     x 2 femoral.  break  sp fall  7th grade    knees     WISDOM TOOTH EXTRACTION      reports that she has never smoked. She has never used smokeless tobacco. She reports current alcohol use. She reports that she does not use drugs. family history includes Breast cancer in her maternal grandmother; Cancer in her maternal grandmother; Colon cancer in her maternal grandmother; Colon polyps in her maternal grandmother; Diabetes in her paternal grandfather; Lupus in her paternal grandmother; Migraines in her father and sister; Obesity in her mother; Osteopenia in her mother; Thyroid disease in her paternal grandmother. Allergies  Allergen Reactions   Ibuprofen Other (See Comments)    GI bleed   Milk-Related Compounds Anaphylaxis   Onion Diarrhea and Nausea Only    NO GARLIC, EITHER    Penicillins Other (See Comments)    Has patient had a PCN reaction causing immediate rash, facial/tongue/throat swelling, SOB or lightheadedness with hypotension: No Has patient had a PCN reaction causing severe rash involving mucus membranes or skin necrosis: No CREATED A G.I. "BLEED" Has patient had a PCN reaction that required hospitalization No Has patient had a PCN reaction occurring within the last 10 years: Yes If all of the above answers are "NO", then may proceed with Cephalosporin use.    Phenergan [Promethazine]     Hallucinations, only takes when supervised.     Review of Systems  Constitutional:  Negative for chills and fever.  HENT:  Positive for congestion and sore throat.   Respiratory:  Negative for cough.       Objective:     BP 100/62 (BP Location: Right Arm, Patient Position: Sitting, Cuff Size: Normal)   Pulse 90   Temp 98.9 F (37.2 C) (Oral)   Ht 5\' 5"  (1.651 m)   Wt 157 lb (71.2 kg)   SpO2 100%   BMI 26.13 kg/m    Physical Exam Vitals reviewed.  Constitutional:      Appearance: She is well-developed.  HENT:     Right Ear: Tympanic membrane normal.  Left Ear: Tympanic membrane normal.     Mouth/Throat:     Comments: Status post tonsillectomy.  No significant erythema.  No exudate. Cardiovascular:     Rate and Rhythm: Normal rate and regular rhythm.  Pulmonary:     Effort: Pulmonary effort is normal.     Breath sounds: Normal breath sounds.  Musculoskeletal:     Cervical back: Neck supple.  Lymphadenopathy:     Cervical: No cervical adenopathy.  Neurological:     Mental Status: She is alert.      Results for orders placed or performed in visit on 06/30/22  POC Rapid Strep A  Result Value Ref Range   Rapid Strep A Screen Negative Negative      The ASCVD Risk score (Arnett DK, et al., 2019) failed to calculate for the following reasons:   The 2019 ASCVD risk score is only valid for ages 51 to 72    Assessment & Plan:    Problem List Items Addressed This Visit   None Visit Diagnoses     Sore throat    -  Primary   Relevant Orders   POC Rapid Strep A (Completed)     Rapid strep negative.  Suspect viral URI.  Treat symptomatically.  Follow-up for any persistent or worsening symptoms.  No follow-ups on file.    Evelena Peat, MD

## 2022-07-19 ENCOUNTER — Ambulatory Visit: Payer: 59 | Admitting: Adult Health

## 2022-07-22 ENCOUNTER — Encounter: Payer: Self-pay | Admitting: Neurology

## 2022-07-22 ENCOUNTER — Ambulatory Visit: Payer: Commercial Managed Care - HMO | Admitting: Neurology

## 2022-07-22 VITALS — BP 122/89 | HR 85 | Ht 65.0 in | Wt 156.8 lb

## 2022-07-22 DIAGNOSIS — G43709 Chronic migraine without aura, not intractable, without status migrainosus: Secondary | ICD-10-CM

## 2022-07-22 DIAGNOSIS — G43001 Migraine without aura, not intractable, with status migrainosus: Secondary | ICD-10-CM

## 2022-07-22 MED ORDER — NURTEC 75 MG PO TBDP: ORAL_TABLET | ORAL | 11 refills | Status: DC

## 2022-07-22 MED ORDER — EMGALITY 120 MG/ML ~~LOC~~ SOAJ: 120.0000 mg | SUBCUTANEOUS | 11 refills | Status: DC

## 2022-07-22 MED ORDER — ELYXYB 120 MG/4.8ML PO SOLN: 120.0000 mg | Freq: Every day | ORAL | 11 refills | Status: DC | PRN

## 2022-07-22 MED ORDER — "BD LUER-LOK SYRINGE 23G X 1"" 3 ML MISC": 11 refills | Status: AC

## 2022-07-22 MED ORDER — ONDANSETRON 4 MG PO TBDP
4.0000 mg | ORAL_TABLET | Freq: Three times a day (TID) | ORAL | 11 refills | Status: DC | PRN
Start: 2022-07-22 — End: 2023-08-03

## 2022-07-22 MED ORDER — KETOROLAC TROMETHAMINE 60 MG/2ML IM SOLN: INTRAMUSCULAR | 11 refills | Status: DC

## 2022-07-22 NOTE — Progress Notes (Signed)
[facility]    Provider:  Dr Lucia Gaskins Requesting Provider: Fabian Sharp [name], MD Primary Care Provider:  Madelin Headings, MD  CC:  Migraines  She loves emgality and loves elyxyb both really work! We will place prescription for elyxyb. Refill emgality. Nurtec and ondansetron usually work. She has 4 migraines a month. She may use elyxyb OR toradol if needed next not both. She is doing great. She knows exactly what sets her off as potential migraine such as her cycle. She was feeling defeated but feels great now.   Patient complains of symptoms per HPI as well as the following symptoms: improved migraines . Pertinent negatives and positives per HPI. All others negative    01/19/2022: last seen was doing great on aimovig with nurtec and toradol for acute management. She switched to [place] and didn't have aimovig through march. So there was a lapse, and started it again in march and she has some stress in the last 1.5 months and sleeping poorly and triggers. She had a bad migraine and went to ED. The light at work triggers her migraines, discussed somnilight and theraspecs and sleeping.  she also has less periods taking . Also having problems with more auras. She has nurtec and it works 50% of the time. Bernita Raisin makes her sleepy and it did not work better than the nurtec. Toradol works. Will give her elyxyb as a trial to use with nurtec. Baseline has daily headaches. She has easily 8 migraine days a month and they moderately severe to severe and interfering to her   CT 01/19/2022 showed No acute intracranial abnormalities including mass lesion or mass effect, hydrocephalus, extra-axial fluid collection, midline shift, hemorrhage, or acute infarction, large ischemic events (personally reviewed images)  Patient complains of symptoms per HPI as well as the following symptoms: migraine aura . Pertinent negatives and positives per HPI. All others negative   07/16/2021: Catherine [name]: Catherine [name] is a 31 year old female with a history of migraine headaches.  She returns today for follow-up.  She reports that Aimovig continues to work well for her.  She takes Nurtec for abortive therapy will use Toradol if Nurtec is not beneficial.  She has approximately 2 headaches a month that typically occur around her menstrual cycle  meds tried: Lamictal, paxil, Topamax(gave her gout and kidney stones), amitriptyline, verapamil, tried other medications as well, maxalt, imitrex(side effects),   Interval history 05/21/2020: Catherine [name] is still working great. Bernita Raisin works fine. She could not get nurtec approved. Nurtec didn't cause tiredness but Ubrelvy did. The triptan had side effects, she has had side effects to multiple triptans.   Interval history 11/15/2019: She feels better, migraines cut in half from at least 8 to 4 migraine days a month. She is on extended birth control and that has helped as well. She has PCOS and this birth control helps. Nurtec helps 50% of the time. She is very happy with the Ajovy. The nurtec wokrs only a little bit, she has tried Scientist, clinical (histocompatibility and immunogenetics) and possibly imitrex, will try zomig. May also try Tosymra or injectable imitrex. Can also try toradol.   HPI:  Catherine [name] is a 31 y.o. female here as requested by Panosh, [name], MD for migraines sine the age of 50 since puberty. She has seen Dr. Vickey Huger. She was on multiple medications. She has tried multiple birth controls to see if this woul dhelp with her migraines. She has PCOS diagnosed at the age of 62. Migraines are very hormonal.  She gets migraines a few days after starting her period. Over the last 6 months she has been having them all throughout the month not just at her period. 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 She tries to use Tylenol, doesn't help. She has sensitivity to light and migraine location varies between sides or both, she has migraines without aura and also with aura - the   aura is swirling lights and blocks but not all the time she also has migraines without aura. Endorses photophobia, phonophbia, pounding, throbbing,nausea,vomiting. A dark cool room helps, moving makes it work, smells may trigger. Have lasted 24-48 hours. She has daily headaches. She has easily 8 migraine days a month and they moderately severe to severe and interfering to her life ongoing over 6 months. No other focal neurologic deficits, associated symptoms, inciting events or modifiable factors.   meds tried: Lamictal, paxil, Topamax(gave her gout and kidney stones), amitriptyline, verapamil, tried other medications as well, maxalt, imitrex(side effects),   Reviewed notes, labs and imaging from outside physicians, which showed:  TSH/T4 Free 08/16/2017 normal  Reviewed notes from Dr. Fabian Sharp, she has had migraines since a teen, mostly migraines during menstrual cycle but worsening over the years, more frequent. She has a remote hx of neurologic care, requested neurology, past meds not helping, kidney stones with topamax. 10 hours of sleep a night, 5 cats. 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 On Lamotrigine for psychiatric d/o.Reviewed examination, normal gen,heent,neck,lungs,CV,psych,musculoskeletal  I will request notes from [facility] where she was seen in the past.  Review of Systems: Patient complains of symptoms per HPI as well as the following symptoms: anxiety . Pertinent negatives and positives per HPI. 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 apartment. 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 Will be difficult to go back financially.      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   Lives with her parents    Right handed   Caffeine: 1 cup/day   Social Determinants of Health   Financial Resource Strain: Low Risk  (06/30/2022)   Overall Financial Resource Strain (CARDIA)    Difficulty of Paying Living Expenses: Not very hard  Food Insecurity: No Food Insecurity (06/30/2022)   Hunger Vital Sign    Worried About Running Out of Food in the Last Year: Never true    Ran Out of Food  in the Last Year: Never true  Transportation Needs: Unmet Transportation Needs (06/30/2022)   PRAPARE - Administrator, Civil Service (Medical): Yes    Lack of Transportation (Non-Medical): No  Physical Activity: Insufficiently Active (06/30/2022)   Exercise Vital Sign    Days of Exercise per Week: 2 days    Minutes of Exercise per Session: 30 min  Stress: No Stress Concern Present (06/30/2022)   [employer] of Occupational Health - Occupational Stress Questionnaire    Feeling of Stress : Only a little  Social Connections: Socially Isolated (06/30/2022)   Social Connection and Isolation Panel [NHANES]    Frequency of Communication with Friends and Family: Twice a week    Frequency of Social Gatherings with Friends and Family: More than three times a week    Attends Religious Services: Never    Database administrator or Organizations: No    Attends Hospital doctor: Not on file    Marital Status: Never married  Catering manager Violence: Not on file    Family History  Problem Relation Age of Onset   Breast cancer Maternal Grandmother    Colon cancer Maternal Grandmother    Colon polyps Maternal Grandmother    Cancer Maternal Grandmother    Diabetes Paternal Grandfather    Lupus Paternal Grandmother    Thyroid disease Paternal Grandmother        Hypothyroid without surgery or irradiation.   Osteopenia Mother    Obesity Mother    Migraines Father        "severe atypical"   Migraines Sister     Past Medical History:  Diagnosis Date   Anxiety    Counseling and psychiatry   Chronic headaches    Sees specialist   Dyspepsia    Fatigue    Goiter    Hashimoto's disease    Hashimoto's thyroiditis    History of lower GI bleeding    History of recurrent UTIs    Sees urologist   History of shingles    face right    Hx of varicella    Hypothyroidism, acquired, autoimmune    IBS (irritable bowel syndrome)    Obesity    Oligomenorrhea    PCOS (polycystic ovarian syndrome)    PUD (peptic ulcer disease)    Thyroid disease     Patient Active Problem List   Diagnosis Date Noted   Chronic migraine without aura without status migrainosus, not intractable 05/20/2019   Migraine with aura and without status migrainosus, not intractable 05/20/2019   Bipolar 1 disorder, mixed, moderate (HCC) 06/16/2016   Mouth ulcer 05/31/2014   Left facial pain 05/31/2014   Herpes simplex labialis 04/17/2013   Vaginal lesions 10/06/2012   Gout attack possible.  09/05/2012   Foot swelling 09/05/2012   Hx of urinary stone 09/05/2012   Dermatitis 09/03/2012   Bright red rectal bleeding 09/01/2012   Diarrhea 09/01/2012   Adjustment disorder with anxiety 05/31/2012   Menstrual migraine 05/31/2012   Vitamin D deficiency 05/31/2012   Hx of fracture of leg 05/31/2012   History of recurrent UTIs    Oligomenorrhea    Goiter    Hashimoto's thyroiditis     Dyspepsia    Hypothyroidism, acquired, autoimmune    Fatigue    IBS (irritable bowel syndrome) 05/11/2011   PCOS (polycystic ovarian syndrome) 12/31/2010   Obesity 12/31/2010    Past Surgical History:  Procedure Laterality Date   BAND HEMORRHOIDECTOMY     KNEE  SURGERY     x 2 femoral.  break  sp fall  7th grade    knees     WISDOM TOOTH EXTRACTION      Current Outpatient Medications  Medication Sig Dispense Refill   acetaminophen (TYLENOL) 650 MG CR tablet Take 650-1,300 mg by mouth every 8 (eight) hours as needed for pain.     ALPRAZolam (XANAX XR) 0.5 MG 24 hr tablet Take 0.5 mg by mouth 2 (two) times daily as needed for anxiety.      Calcium Carbonate-Vitamin D (CALCIUM 500 + D PO) Take 1 tablet by mouth daily after breakfast.      Celecoxib (ELYXYB) 120 MG/4.8ML SOLN Take 120 mg by mouth daily as needed. 28.8 mL 11   cetirizine (ZYRTEC) 10 MG tablet Take 10 mg by mouth daily.       EPINEPHrine 0.3 mg/0.3 mL IJ SOAJ injection INJECT 1 SYRINGE IN THE MUSCLE AS NEEDED FOR ANAPHYLAXIS 2 each 0   lamoTRIgine (LAMICTAL) 150 MG tablet Take 300 mg by mouth daily.     metFORMIN (GLUCOPHAGE) 500 MG tablet Take 500 mg by mouth daily with breakfast.     Multiple Vitamin (MULTI-VITAMIN PO) Take 1 tablet by mouth daily after breakfast.      Probiotic Product (PROBIOTIC PO) Take 1 capsule by mouth every morning.      valACYclovir (VALTREX) 1000 MG tablet TAKE 2 TABLETS BY MOUTH TWICE DAILY OR AS DIRECTED 15 tablet 0   VYFEMLA 0.4-35 MG-MCG tablet Take 1 tablet by mouth daily.     Galcanezumab-gnlm (EMGALITY) 120 MG/ML SOAJ Inject 120 mg into the skin every 30 (thirty) days. 1.12 mL 11   ketorolac (TORADOL) 60 MG/2ML SOLN injection Inject 1-39ml (30-60mg ) intramuscularly at onset of migraine. May repeat in 6 hours. Max twice a day and 4 days per month. 10 mL 11   ondansetron (ZOFRAN-ODT) 4 MG disintegrating tablet Take 1 tablet (4 mg total) by mouth every 8 (eight) hours as needed for nausea.  May take for migraine as well. 30 tablet 11   Rimegepant Sulfate (NURTEC) 75 MG TBDP DISSOLVE ONE TABLET BY MOUTH AS NEEDED FOR MIGRAINES. TAKE AS CLOSE TO ONSET OF MIGRAINE. ONE MAX PER DAY 16 tablet 11   SYRINGE-NEEDLE, DISP, 3 ML (B-D 3CC LUER-LOK SYR 23GX1") 23G X 1" 3 ML MISC USE WITH KETOROLAC IN THE MUSCLE AS DIRECTED 10 each 11   Current Facility-Administered Medications  Medication Dose Route Frequency Provider Last Rate Last Admin   Galcanezumab-gnlm SOAJ 240 mg  240 mg Subcutaneous Once Anson Fret, MD        Allergies as of 07/22/2022 - Review Complete 07/22/2022  Allergen Reaction Noted   Ibuprofen Other (See Comments) 03/01/2018   Milk-related compounds Anaphylaxis 01/20/2013   Onion Diarrhea and Nausea Only 01/12/2016   Penicillins Other (See Comments) 07/24/2011   Phenergan [promethazine]  05/21/2020    Vitals: BP 122/89   Pulse 85   Ht 5\' 5"  (1.651 m)   Wt 156 lb 12.8 oz (71.1 kg)   BMI 26.09 kg/m  Last Weight:  Wt Readings from Last 1 Encounters:  07/22/22 156 lb 12.8 oz (71.1 kg)   Last Height:   Ht Readings from Last 1 Encounters:  07/22/22 5\' 5"  (1.651 m)   Exam: NAD, pleasant                  Speech:    Speech is normal; fluent and spontaneous with normal comprehension.  Cognition:  The patient is oriented to person, place, and time;     recent and remote memory intact;     language fluent;    Cranial Nerves:    The pupils are equal, round, and reactive to light.Trigeminal sensation is intact and the muscles of mastication are normal. The face is symmetric. The palate elevates in the midline. Hearing intact. Voice is normal. Shoulder shrug is normal. The tongue has normal motion without fasciculations.   Coordination:  No dysmetria  Motor Observation:    No asymmetry, no atrophy, and no involuntary movements noted. Tone:    Normal muscle tone.     Strength:    Strength is V/V in the upper and lower limbs.      Sensation: intact to  LT     Assessment/Plan:  31 year old with Chronic Migraines without and with aura failed multiple classes of first-lime migraine preventative medications. meds tried: Lamictal, paxil, Topamax(gave her gout and kidney stones), amitriptyline, propranolol and other BP meds contraindicated due to  verapamil, tried other medications as well. Discussed options for acute and preventative treatments, oral medications, injections, botox. At baseline She has daily headaches and easily 8 migraine days a month and they are moderately severe to severe and interfering to her   She loves emgality and loves elyxyb both really work! We will place prescription for elyxyb. Refill emgality. Nurtec and ondansetron usually work. She has 4 migraines a month. She may use elyxyb OR toradol if needed next not both. She is doing great. She knows exactly what sets her off as potential migraine such as her cycle. She was feeling defeated but feels great now.  Try magnesium (mag oxide, mag citrate, any mag that works best for you) 400-600mg /day for aura- Do not take elyxb with ketorolac, they are in the same class, discussed  Discussed: There is increased risk for stroke in women with migraine with aura and a contraindication for the combined contraceptive pill for use by women who have migraine with aura. The risk for women with migraine without aura is lower. However other risk factors like smoking are far more likely to increase stroke risk than migraine. There is a recommendation for no smoking and for the use of OCPs without estrogen such as progestogen only pills particularly for women with migraine with aura.[name] People who have migraine headaches with auras may be 3 times more likely to have a stroke caused by a blood clot, compared to migraine patients who don't see auras. Women who take hormone-replacement therapy may be 30 percent more likely to suffer a clot-based stroke than women not taking medication containing estrogen.  Other risk factors like smoking and high blood pressure may be  much more important.  Meds ordered this encounter  Medications   Galcanezumab-gnlm (EMGALITY) 120 MG/ML SOAJ    Sig: Inject 120 mg into the skin every 30 (thirty) days.    Dispense:  1.12 mL    Refill:  11   ketorolac (TORADOL) 60 MG/2ML SOLN injection    Sig: Inject 1-61ml (30-60mg ) intramuscularly at onset of migraine. May repeat in 6 hours. Max twice a day and 4 days per month.    Dispense:  10 mL    Refill:  11   ondansetron (ZOFRAN-ODT) 4 MG disintegrating tablet    Sig: Take 1 tablet (4 mg total) by mouth every 8 (eight) hours as needed for nausea. May take for migraine as well.    Dispense:  30 tablet    Refill:  11   Rimegepant Sulfate (NURTEC) 75 MG TBDP    Sig: DISSOLVE ONE TABLET BY MOUTH AS NEEDED FOR MIGRAINES. TAKE AS CLOSE TO ONSET OF MIGRAINE. ONE MAX PER DAY    Dispense:  16 tablet    Refill:  11   SYRINGE-NEEDLE, DISP, 3 ML (B-D 3CC LUER-LOK SYR 23GX1") 23G X 1" 3 ML MISC    Sig: USE WITH KETOROLAC IN THE MUSCLE AS DIRECTED    Dispense:  10 each    Refill:  11   Celecoxib (ELYXYB) 120 MG/4.8ML SOLN    Sig: Take 120 mg by mouth daily as needed.    Dispense:  28.8 mL    Refill:  11     Discussed: To prevent or relieve headaches, try the following: Cool Compress. Lie down and place a cool compress on your head.  Avoid headache triggers. If certain foods or odors seem to have triggered your migraines in the past, avoid them. A headache diary might help you identify triggers.  Include physical activity in your daily routine. Try a daily walk or other moderate aerobic exercise.  Manage stress. Find healthy ways to cope with the stressors, such as delegating tasks on your to-do list.  Practice relaxation techniques. Try deep breathing, yoga, massage and visualization.  Eat regularly. Eating regularly scheduled meals and maintaining a healthy diet might help prevent headaches. Also, drink plenty of fluids.   Follow a regular sleep schedule. Sleep deprivation might contribute to headaches Consider biofeedback. With this mind-body technique, you learn to control certain bodily functions -- such as muscle tension, heart rate and blood pressure -- to prevent headaches or reduce headache pain.    Proceed to emergency room if you experience new or worsening symptoms or symptoms do not resolve, if you have new neurologic symptoms or if headache is severe, or for any concerning symptom.   Provided education and documentation from American headache Society toolbox including articles on: chronic migraine medication overuse headache, chronic migraines, prevention of migraines, behavioral and other nonpharmacologic treatments for headache.    Cc: Panosh, Neta MendsWanda K, MD  Naomie DeanAntonia Zyliah Schier, MD  [facility] [address] [place] [id number]  Phone [phone] Fax [phone]  I spent over 20 minutes of face-to-face and non-face-to-face time with patient on the  1. Migraine without aura, not intractable, with status migrainosus   2. Chronic migraine without aura without status migrainosus, not intractable     diagnosis.  This included previsit chart review, lab review, study review, order entry, electronic health record documentation, patient education on the different diagnostic and therapeutic options, counseling and coordination of care, risks and benefits of management, compliance, or risk factor reduction

## 2022-07-28 ENCOUNTER — Encounter: Payer: Self-pay | Admitting: *Deleted

## 2022-12-27 ENCOUNTER — Telehealth: Payer: Self-pay | Admitting: Neurology

## 2022-12-27 NOTE — Telephone Encounter (Signed)
Sent mychart msg informing pt of appt change due to provider schedule change 

## 2023-01-15 ENCOUNTER — Telehealth: Payer: Self-pay | Admitting: Pharmacy Technician

## 2023-01-15 NOTE — Telephone Encounter (Signed)
Patient Advocate Encounter   Received notification that prior authorization for Emgality 120MG /ML auto-injectors (migraine) is required.   PA submitted on 01/15/2023 [employer] Electronic PA Form Status is pending

## 2023-01-18 ENCOUNTER — Other Ambulatory Visit (HOSPITAL_COMMUNITY): Payer: Self-pay

## 2023-01-18 NOTE — Telephone Encounter (Signed)
Pharmacy Patient Advocate Encounter  Prior Authorization for Emgality 120MG /ML auto-injectors (migraine) has been APPROVED by [employer] from 01/15/2023 to 01/17/2024.   PA # [id number]

## 2023-05-31 ENCOUNTER — Encounter: Payer: Self-pay | Admitting: *Deleted

## 2023-07-01 ENCOUNTER — Other Ambulatory Visit: Payer: Self-pay | Admitting: Internal Medicine

## 2023-07-02 ENCOUNTER — Other Ambulatory Visit: Payer: Self-pay | Admitting: Internal Medicine

## 2023-08-01 ENCOUNTER — Telehealth: Payer: Commercial Managed Care - HMO | Admitting: Neurology

## 2023-08-03 ENCOUNTER — Telehealth: Payer: Self-pay | Admitting: Neurology

## 2023-08-03 ENCOUNTER — Telehealth: Payer: Commercial Managed Care - HMO | Admitting: Neurology

## 2023-08-03 DIAGNOSIS — G43001 Migraine without aura, not intractable, with status migrainosus: Secondary | ICD-10-CM

## 2023-08-03 DIAGNOSIS — G43709 Chronic migraine without aura, not intractable, without status migrainosus: Secondary | ICD-10-CM

## 2023-08-03 MED ORDER — ONDANSETRON 4 MG PO TBDP
4.0000 mg | ORAL_TABLET | Freq: Three times a day (TID) | ORAL | 11 refills | Status: AC | PRN
Start: 2023-08-03 — End: ?

## 2023-08-03 MED ORDER — KETOROLAC TROMETHAMINE 60 MG/2ML IM SOLN: INTRAMUSCULAR | 11 refills | Status: AC

## 2023-08-03 NOTE — Progress Notes (Unsigned)
[facility]    Provider:  Dr Lucia Gaskins Requesting Provider: Fabian Sharp [name], MD Primary Care Provider:  Madelin Headings, MD  CC:  Migraines  Virtual Visit via Video Note  I connected with Alesia Morin on 08/04/23 at 11:00 AM EST by a video enabled telemedicine application and verified that I am speaking with the correct person using two identifiers.  Location: Patient: home Provider: office   I discussed the limitations of evaluation and management by telemedicine and the availability of in person appointments. The patient expressed understanding and agreed to proceed.   Follow Up Instructions:    I discussed the assessment and treatment plan with the patient. The patient was provided an opportunity to ask questions and all were answered. The patient agreed with the plan and demonstrated an understanding of the instructions.   The patient was advised to call back or seek an in-person evaluation if the symptoms worsen or if the condition fails to improve as anticipated.  I provided 20 minutes of non-face-to-face time during this encounter.   Anson Fret, MD   08/03/2023:Still loves Emgality: she uses ondansetron as a preventative and that helps. In the new year I will prescribe Nurtec with copay card.  Tried ubrelvy and maxalt, imitrex and all with drowsiness. Nurtec was great. On Emgality she only has 4-5 migraine days a month and < 8 total headache days a month. In the new year I will prescribe Nurtec with copay card around Jan 15th. We got Emgality approved in June of 2024.    meds tried > 3 months : Lamictal, paxil, Topamax(gave her gout and kidney stones), amitriptyline, verapamil, tried other medications as well, maxalt, imitrex(side effects), ubelvy(side effects), nurtec worked great, Merchant navy officer worked Firefighter, Gaffer did not work at all. Ajovy, tylenol, elyxyb, effexor, desvenafexine, diclofenac  Patient complains of symptoms per HPI as well as the  following symptoms: none . Pertinent negatives and positives per HPI. All others negative  07/2022: She loves emgality and loves elyxyb both really work! We will place prescription for elyxyb. Refill emgality. Nurtec and ondansetron usually work. She has 4 migraines a month. She may use elyxyb OR toradol if needed next not both. She is doing great. She knows exactly what sets her off as potential migraine such as her cycle. She was feeling defeated but feels great now.   Patient complains of symptoms per HPI as well as the following symptoms: improved migraines . Pertinent negatives and positives per HPI. All others negative    01/19/2022: last seen was doing great on aimovig with nurtec and toradol for acute management. She switched to [place] and didn't have aimovig through march. So there was a lapse, and started it again in march and she has some stress in the last 1.5 months and sleeping poorly and triggers. She had a bad migraine and went to ED. The light at work triggers her migraines, discussed somnilight and theraspecs and sleeping.  she also has less periods taking . Also having problems with more auras. She has nurtec and it works 50% of the time. Bernita Raisin makes her sleepy and it did not work better than the nurtec. Toradol works. Will give her elyxyb as a trial to use with nurtec. Baseline has daily headaches. She has easily 8 migraine days a month and they moderately severe to severe and interfering to her   CT 01/19/2022 showed No acute intracranial abnormalities including mass lesion or mass effect, hydrocephalus, extra-axial fluid collection, midline shift, hemorrhage,  or acute infarction, large ischemic events (personally reviewed images)  Patient complains of symptoms per HPI as well as the following symptoms: migraine aura . Pertinent negatives and positives per HPI. All others negative   07/16/2021: Butch Penny NP: Ms. Hatz is a 32 year old female with a history of migraine headaches.   She returns today for follow-up.  She reports that Aimovig continues to work well for her.  She takes Nurtec for abortive therapy will use Toradol if Nurtec is not beneficial.  She has approximately 2 headaches a month that typically occur around her menstrual cycle    Interval history 05/21/2020: Arnetha Massy is still working great. Bernita Raisin works fine. She could not get nurtec approved. Nurtec didn't cause tiredness but Ubrelvy did. The triptan had side effects, she has had side effects to multiple triptans.   Interval history 11/15/2019: She feels better, migraines cut in half from at least 8 to 4 migraine days a month. She is on extended birth control and that has helped as well. She has PCOS and this birth control helps. Nurtec helps 50% of the time. She is very happy with the Ajovy. The nurtec wokrs only a little bit, she has tried Scientist, clinical (histocompatibility and immunogenetics) and possibly imitrex, will try zomig. May also try Tosymra or injectable imitrex. Can also try toradol.   HPI:  VICTORIAANN EICHELBERGER is a 32 y.o. female here as requested by Panosh, [name], MD for migraines sine the age of 66 since puberty. She has seen Dr. Vickey Huger. She was on multiple medications. She has tried multiple birth controls to see if this woul dhelp with her migraines. She has PCOS diagnosed at the age of 36. Migraines are very hormonal. She gets migraines a few days after starting her period. Over the last 6 months she has been having them all throughout the month not just at her period. 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 She tries to use Tylenol, doesn't help. She has sensitivity to light and migraine location varies between sides or both, she has migraines without aura and also with aura - the  aura is swirling lights and blocks but not all the time she also has migraines without aura. Endorses photophobia, phonophbia, pounding, throbbing,nausea,vomiting. A dark cool room helps, moving makes it work, smells may trigger.  Have lasted 24-48 hours. She has daily headaches. She has easily 8 migraine days a month and they moderately severe to severe and interfering to her life ongoing over 6 months. No other focal neurologic deficits, associated symptoms, inciting events or modifiable factors.   meds tried: Lamictal, paxil, Topamax(gave her gout and kidney stones), amitriptyline, verapamil, tried other medications as well, maxalt, imitrex(side effects),   Reviewed notes, labs and imaging from outside physicians, which showed:  TSH/T4 Free 08/16/2017 normal  Reviewed notes from Dr. Fabian Sharp, she has had migraines since a teen, mostly migraines during menstrual cycle but worsening over the years, more frequent. She has a remote hx of neurologic care, requested neurology, past meds not helping, kidney stones with topamax. 10 hours of sleep a night, 5 cats. 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 On Lamotrigine for psychiatric d/o.Reviewed examination, normal gen,heent,neck,lungs,CV,psych,musculoskeletal  I will request notes from [facility] where she was seen in the past.  Review of Systems: Patient complains of symptoms per HPI as well as the following symptoms: anxiety . Pertinent negatives and positives per HPI. 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 apartment. 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 Will be difficult to go back financially.      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   Lives with her parents    Right handed   Caffeine: 1 cup/day   Social Drivers of Corporate investment banker Strain: Low Risk  (06/30/2022)   Overall Financial Resource Strain (CARDIA)    Difficulty of Paying Living Expenses: Not very hard  Food Insecurity: No Food Insecurity (06/30/2022)   Hunger Vital Sign    Worried About Running Out of Food in the Last Year: Never true    Ran Out of Food in the Last Year: Never true  Transportation Needs: Unmet Transportation Needs (06/30/2022)   PRAPARE - Administrator, Civil Service (Medical): Yes    Lack of Transportation (Non-Medical): No  Physical Activity: Insufficiently Active (06/30/2022)   Exercise Vital Sign    Days of Exercise per Week: 2 days    Minutes of Exercise per Session: 30 min  Stress: No Stress Concern Present (06/30/2022)   [employer] of Occupational Health - Occupational Stress Questionnaire    Feeling of Stress : Only a little  Social Connections: Socially Isolated (06/30/2022)   Social Connection and Isolation Panel [NHANES]    Frequency of Communication with Friends and Family: Twice a week    Frequency of Social Gatherings with Friends and Family: More than three times a week    Attends Religious Services: Never    Database administrator or Organizations: No    Attends Engineer, structural: Not on file    Marital Status: Never married  Catering manager Violence: Not on file    Family History  Problem Relation Age of Onset   Breast cancer Maternal Grandmother    Colon cancer Maternal Grandmother    Colon  polyps Maternal Grandmother    Cancer Maternal Grandmother    Diabetes Paternal Grandfather    Lupus Paternal Grandmother    Thyroid disease Paternal Grandmother        Hypothyroid without surgery or irradiation.   Osteopenia Mother    Obesity Mother    Migraines Father        "severe atypical"   Migraines Sister     Past Medical History:  Diagnosis Date   Anxiety    Counseling and psychiatry   Chronic headaches    Sees specialist   Dyspepsia    Fatigue  Goiter    Hashimoto's disease    Hashimoto's thyroiditis    History of lower GI bleeding    History of recurrent UTIs    Sees urologist   History of shingles    face right    Hx of varicella    Hypothyroidism, acquired, autoimmune    IBS (irritable bowel syndrome)    Obesity    Oligomenorrhea    PCOS (polycystic ovarian syndrome)    PUD (peptic ulcer disease)    Thyroid disease     Patient Active Problem List   Diagnosis Date Noted   Chronic migraine without aura without status migrainosus, not intractable 05/20/2019   Migraine with aura and without status migrainosus, not intractable 05/20/2019   Bipolar 1 disorder, mixed, moderate (HCC) 06/16/2016   Mouth ulcer 05/31/2014   Left facial pain 05/31/2014   Herpes simplex labialis 04/17/2013   Vaginal lesions 10/06/2012   Gout attack possible.  09/05/2012   Foot swelling 09/05/2012   Hx of urinary stone 09/05/2012   Dermatitis 09/03/2012   Bright red rectal bleeding 09/01/2012   Diarrhea 09/01/2012   Adjustment disorder with anxiety 05/31/2012   Menstrual migraine 05/31/2012   Vitamin D deficiency 05/31/2012   Hx of fracture of leg 05/31/2012   History of recurrent UTIs    Oligomenorrhea    Goiter    Hashimoto's thyroiditis    Dyspepsia    Hypothyroidism, acquired, autoimmune    Fatigue    IBS (irritable bowel syndrome) 05/11/2011   PCOS (polycystic ovarian syndrome) 12/31/2010   Obesity 12/31/2010    Past Surgical History:  Procedure Laterality  Date   BAND HEMORRHOIDECTOMY     KNEE SURGERY     x 2 femoral.  break  sp fall  7th grade    knees     WISDOM TOOTH EXTRACTION      Current Outpatient Medications  Medication Sig Dispense Refill   ALPRAZolam (XANAX XR) 0.5 MG 24 hr tablet Take 0.5 mg by mouth 2 (two) times daily as needed for anxiety.      Calcium Carbonate-Vitamin D (CALCIUM 500 + D PO) Take 1 tablet by mouth daily after breakfast.      Celecoxib (ELYXYB) 120 MG/4.8ML SOLN Take 120 mg by mouth daily as needed. 28.8 mL 11   cetirizine (ZYRTEC) 10 MG tablet Take 10 mg by mouth daily.       EPINEPHrine 0.3 mg/0.3 mL IJ SOAJ injection INJECT ONE SYRINGE IN THE MUSCLE AS NEEDED FOR ANAPHYLAXIS 2 each 0   Galcanezumab-gnlm (EMGALITY) 120 MG/ML SOAJ Inject 120 mg into the skin every 30 (thirty) days. 1.12 mL 11   ketorolac (TORADOL) 60 MG/2ML SOLN injection Inject 1-81ml (30-60mg ) intramuscularly at onset of migraine. May repeat in 6 hours. Max twice a day and 4 days per month. 10 mL 11   lamoTRIgine (LAMICTAL) 150 MG tablet Take 300 mg by mouth daily.     metFORMIN (GLUCOPHAGE) 500 MG tablet Take 500 mg by mouth daily with breakfast.     Multiple Vitamin (MULTI-VITAMIN PO) Take 1 tablet by mouth daily after breakfast.      ondansetron (ZOFRAN-ODT) 4 MG disintegrating tablet Take 1 tablet (4 mg total) by mouth every 8 (eight) hours as needed for nausea. May take for migraine as well. 30 tablet 11   Probiotic Product (PROBIOTIC PO) Take 1 capsule by mouth every morning.      Rimegepant Sulfate (NURTEC) 75 MG TBDP DISSOLVE ONE TABLET BY MOUTH AS NEEDED FOR MIGRAINES. TAKE  AS CLOSE TO ONSET OF MIGRAINE. ONE MAX PER DAY 16 tablet 11   SYRINGE-NEEDLE, DISP, 3 ML (B-D 3CC LUER-LOK SYR 23GX1") 23G X 1" 3 ML MISC USE WITH KETOROLAC IN THE MUSCLE AS DIRECTED 10 each 11   VYFEMLA 0.4-35 MG-MCG tablet Take 1 tablet by mouth daily.     Current Facility-Administered Medications  Medication Dose Route Frequency Provider Last Rate Last Admin    Galcanezumab-gnlm SOAJ 240 mg  240 mg Subcutaneous Once Anson Fret, MD        Allergies as of 08/03/2023 - Review Complete 07/22/2022  Allergen Reaction Noted   Ibuprofen Other (See Comments) 03/01/2018   Milk-related compounds Anaphylaxis 01/20/2013   Onion Diarrhea and Nausea Only 01/12/2016   Penicillins Other (See Comments) 07/24/2011   Phenergan [promethazine]  05/21/2020    Vitals: There were no vitals taken for this visit. Last Weight:  Wt Readings from Last 1 Encounters:  07/22/22 156 lb 12.8 oz (71.1 kg)   Last Height:   Ht Readings from Last 1 Encounters:  07/22/22 5\' 5"  (1.651 m)   Physical exam: Exam: Gen: NAD, conversant      CV: No palpitations or chest pain or SOB. VS: Breathing at a normal rate. Weight appears within normal limits. Not febrile. Eyes: Conjunctivae clear without exudates or hemorrhage  Neuro: Detailed Neurologic Exam  Speech:    Speech is normal; fluent and spontaneous with normal comprehension.  Cognition:    The patient is oriented to person, place, and time;     recent and remote memory intact;     language fluent;     normal attention, concentration, fund of knowledge Cranial Nerves:    The pupils are equal, round, and reactive to light. Visual fields are full Extraocular movements are intact.  The face is symmetric with normal sensation. The palate elevates in the midline. Hearing intact. Voice is normal. Shoulder shrug is normal. The tongue has normal motion without fasciculations.   Coordination: normal  Gait:    No abnormalities noted or reported  Motor Observation:   no involuntary movements noted. Tone:    Appears normal  Posture:    Posture is normal. normal erect    Strength:    Strength is anti-gravity and symmetric in the upper and lower limbs.      Sensation: intact to LT, no reports of numbness or tingling or paresthesias           Assessment/Plan:  32 year old with Chronic Migraines without and with  aura failed multiple classes of first-lime migraine preventative. At baseline She has daily headaches and easily 10 migraine days a month and they are moderately severe to severe and interfering to her   Still loves Emgality: she uses ondansetron as a preventative and that helps. In the new year I will prescribe Nurtec with copay card.  Tried ubrelvy and maxalt, imitrex and all with drowsiness. Nurtec was great. On Emgality she only has 4-5 migraine days a month and < 8 total headache days a month. In the new year I will prescribe Nurtec with copay card around Jan 15th. We got Emgality approved in June of 2024.    meds tried > 3 months : Lamictal, paxil, Topamax(gave her gout and kidney stones), amitriptyline, verapamil, tried other medications as well, maxalt, imitrex(side effects), ubelvy(side effects), nurtec worked great, Merchant navy officer worked Firefighter, Gaffer did not work at all. Ajovy, tylenol, elyxyb, effexor, desvenafexine, diclofenac  Discussed: There is increased risk for stroke in women  with migraine with aura and a contraindication for the combined contraceptive pill for use by women who have migraine with aura. The risk for women with migraine without aura is lower. However other risk factors like smoking are far more likely to increase stroke risk than migraine. There is a recommendation for no smoking and for the use of OCPs without estrogen such as progestogen only pills particularly for women with migraine with aura.[name] People who have migraine headaches with auras may be 3 times more likely to have a stroke caused by a blood clot, compared to migraine patients who don't see auras. Women who take hormone-replacement therapy may be 30 percent more likely to suffer a clot-based stroke than women not taking medication containing estrogen. Other risk factors like smoking and high blood pressure may be  much more important.  Meds ordered this encounter  Medications   ketorolac (TORADOL) 60 MG/2ML SOLN  injection    Sig: Inject 1-28ml (30-60mg ) intramuscularly at onset of migraine. May repeat in 6 hours. Max twice a day and 4 days per month.    Dispense:  10 mL    Refill:  11   ondansetron (ZOFRAN-ODT) 4 MG disintegrating tablet    Sig: Take 1 tablet (4 mg total) by mouth every 8 (eight) hours as needed for nausea. May take for migraine as well.    Dispense:  30 tablet    Refill:  11     Discussed: To prevent or relieve headaches, try the following: Cool Compress. Lie down and place a cool compress on your head.  Avoid headache triggers. If certain foods or odors seem to have triggered your migraines in the past, avoid them. A headache diary might help you identify triggers.  Include physical activity in your daily routine. Try a daily walk or other moderate aerobic exercise.  Manage stress. Find healthy ways to cope with the stressors, such as delegating tasks on your to-do list.  Practice relaxation techniques. Try deep breathing, yoga, massage and visualization.  Eat regularly. Eating regularly scheduled meals and maintaining a healthy diet might help prevent headaches. Also, drink plenty of fluids.  Follow a regular sleep schedule. Sleep deprivation might contribute to headaches Consider biofeedback. With this mind-body technique, you learn to control certain bodily functions -- such as muscle tension, heart rate and blood pressure -- to prevent headaches or reduce headache pain.    Proceed to emergency room if you experience new or worsening symptoms or symptoms do not resolve, if you have new neurologic symptoms or if headache is severe, or for any concerning symptom.   Provided education and documentation from American headache Society toolbox including articles on: chronic migraine medication overuse headache, chronic migraines, prevention of migraines, behavioral and other nonpharmacologic treatments for headache.    Cc: Panosh, [name], MD  Naomie Dean, MD  [facility]  [facility] [address] [place], [place] [address]  Phone [phone] Fax [phone]

## 2023-08-03 NOTE — Telephone Encounter (Signed)
Please call patient and schedule her for a 1 year follow up with dr Lucia Gaskins beginning of January first week of January earlier available can be video

## 2023-08-03 NOTE — Telephone Encounter (Signed)
Called pt and scheduled for 08/22/24 @ 3:00 pm with Dr. Lucia Gaskins.

## 2023-08-04 ENCOUNTER — Encounter: Payer: Self-pay | Admitting: Neurology

## 2023-08-09 ENCOUNTER — Other Ambulatory Visit: Payer: Self-pay | Admitting: Neurology

## 2023-08-09 NOTE — Telephone Encounter (Signed)
Rx refilled per last office visit note.

## 2023-08-22 DIAGNOSIS — F411 Generalized anxiety disorder: Secondary | ICD-10-CM | POA: Diagnosis not present

## 2023-08-22 DIAGNOSIS — F3130 Bipolar disorder, current episode depressed, mild or moderate severity, unspecified: Secondary | ICD-10-CM | POA: Diagnosis not present

## 2023-08-30 ENCOUNTER — Encounter: Payer: Self-pay | Admitting: Neurology

## 2023-08-30 ENCOUNTER — Other Ambulatory Visit: Payer: Self-pay | Admitting: Neurology

## 2023-08-30 DIAGNOSIS — G43001 Migraine without aura, not intractable, with status migrainosus: Secondary | ICD-10-CM

## 2023-08-30 DIAGNOSIS — F411 Generalized anxiety disorder: Secondary | ICD-10-CM | POA: Diagnosis not present

## 2023-08-30 DIAGNOSIS — F3130 Bipolar disorder, current episode depressed, mild or moderate severity, unspecified: Secondary | ICD-10-CM | POA: Diagnosis not present

## 2023-08-30 MED ORDER — NURTEC 75 MG PO TBDP: 75.0000 mg | ORAL_TABLET | Freq: Every day | ORAL | 11 refills | Status: AC | PRN

## 2023-09-06 DIAGNOSIS — F3130 Bipolar disorder, current episode depressed, mild or moderate severity, unspecified: Secondary | ICD-10-CM | POA: Diagnosis not present

## 2023-09-06 DIAGNOSIS — F411 Generalized anxiety disorder: Secondary | ICD-10-CM | POA: Diagnosis not present

## 2023-09-06 NOTE — Telephone Encounter (Signed)
-----   Message from Anson Fret sent at 08/03/2023 11:33 AM EST ----- Regarding: HAS NEW INSURANCE order emgality, sign up for new copay card. HAS NEW INSURANCE order emgality, sign up for new copay card. Try a 99-month supply? She has [employer] let the pharmacy know. Also for nurtec

## 2023-09-12 DIAGNOSIS — F3130 Bipolar disorder, current episode depressed, mild or moderate severity, unspecified: Secondary | ICD-10-CM | POA: Diagnosis not present

## 2023-09-12 DIAGNOSIS — F411 Generalized anxiety disorder: Secondary | ICD-10-CM | POA: Diagnosis not present

## 2023-09-19 DIAGNOSIS — F3130 Bipolar disorder, current episode depressed, mild or moderate severity, unspecified: Secondary | ICD-10-CM | POA: Diagnosis not present

## 2023-09-19 DIAGNOSIS — F411 Generalized anxiety disorder: Secondary | ICD-10-CM | POA: Diagnosis not present

## 2023-09-28 DIAGNOSIS — F411 Generalized anxiety disorder: Secondary | ICD-10-CM | POA: Diagnosis not present

## 2023-09-28 DIAGNOSIS — F3130 Bipolar disorder, current episode depressed, mild or moderate severity, unspecified: Secondary | ICD-10-CM | POA: Diagnosis not present

## 2023-09-28 MED ORDER — ELYXYB 120 MG/4.8ML PO SOLN: 120.0000 mg | Freq: Every day | ORAL | 4 refills | Status: AC | PRN

## 2023-09-28 NOTE — Addendum Note (Signed)
Addended by: Bertram Savin on: 09/28/2023 02:33 PM   Modules accepted: Orders

## 2023-09-29 ENCOUNTER — Other Ambulatory Visit (HOSPITAL_COMMUNITY): Payer: Self-pay

## 2023-09-29 ENCOUNTER — Telehealth: Payer: Self-pay

## 2023-09-29 ENCOUNTER — Telehealth: Payer: Self-pay | Admitting: Neurology

## 2023-09-29 DIAGNOSIS — F3174 Bipolar disorder, in full remission, most recent episode manic: Secondary | ICD-10-CM | POA: Diagnosis not present

## 2023-09-29 DIAGNOSIS — F410 Panic disorder [episodic paroxysmal anxiety] without agoraphobia: Secondary | ICD-10-CM | POA: Diagnosis not present

## 2023-09-29 DIAGNOSIS — F3176 Bipolar disorder, in full remission, most recent episode depressed: Secondary | ICD-10-CM | POA: Diagnosis not present

## 2023-09-29 NOTE — Telephone Encounter (Signed)
*  [facility]  Pharmacy Patient Advocate Encounter   Received notification from Pt Calls Messages that prior authorization for Elyxyb 120MG /4.8ML solution  is required/requested.   Insurance verification completed.   The patient is insured through [facility] .   Per test claim: PA required; PA submitted to above mentioned insurance via CoverMyMeds Key/confirmation #/EOC [id number] Status is pending

## 2023-09-29 NOTE — Telephone Encounter (Signed)
Fatima @ [facility] - [place], [place] - [address] reports that a PA is needed for Celecoxib (ELYXYB) 120 MG/4.8ML SOLN  & Rimegepant Sulfate (NURTEC) 75 MG TBDP

## 2023-09-30 ENCOUNTER — Telehealth: Payer: Self-pay

## 2023-09-30 ENCOUNTER — Other Ambulatory Visit (HOSPITAL_COMMUNITY): Payer: Self-pay

## 2023-09-30 NOTE — Telephone Encounter (Signed)
Pharmacy Patient Advocate Encounter  Received notification from [facility] that Prior Authorization for Elyxyb  has been DENIED.  Full denial letter will be uploaded to the media tab. See denial reason below.   PA #/Case ID/Reference #:

## 2023-09-30 NOTE — Telephone Encounter (Signed)
*  [facility]  Pharmacy Patient Advocate Encounter   Received notification from Fax that prior authorization for Emgality 120MG /ML auto-injectors (migraine)  is required/requested.   Insurance verification completed.   The patient is insured through [facility] .   Per test claim: PA required; PA submitted to above mentioned insurance via CoverMyMeds Key/confirmation #/EOC [id number] Status is pending

## 2023-10-04 ENCOUNTER — Other Ambulatory Visit (HOSPITAL_COMMUNITY): Payer: Self-pay

## 2023-10-04 ENCOUNTER — Telehealth: Payer: Self-pay

## 2023-10-04 DIAGNOSIS — F411 Generalized anxiety disorder: Secondary | ICD-10-CM | POA: Diagnosis not present

## 2023-10-04 DIAGNOSIS — F3130 Bipolar disorder, current episode depressed, mild or moderate severity, unspecified: Secondary | ICD-10-CM | POA: Diagnosis not present

## 2023-10-04 NOTE — Telephone Encounter (Signed)
 Pharmacy Patient Advocate Encounter   Received notification from Physician's Office that prior authorization for Nurtec 75MG  dispersible tablets is required/requested.   Insurance verification completed.   The patient is insured through [facility] .   Per test claim: PA required; PA submitted to above mentioned insurance via CoverMyMeds Key/confirmation #/EOC [id number] Status is pending

## 2023-10-04 NOTE — Telephone Encounter (Signed)
 thanks

## 2023-10-04 NOTE — Telephone Encounter (Signed)
 Fax request received and sent to plan via [phone], pending determination.

## 2023-10-05 ENCOUNTER — Other Ambulatory Visit (HOSPITAL_COMMUNITY): Payer: Self-pay

## 2023-10-05 NOTE — Telephone Encounter (Signed)
 Pharmacy Patient Advocate Encounter  Received notification from [facility] that Prior Authorization for Nurtec has been APPROVED from 10/02/2023 to 10/01/2024. Ran test claim, Copay is $45.24. This test claim was processed through [facility]- copay amounts may vary at other pharmacies due to pharmacy/plan contracts, or as the patient moves through the different stages of their insurance plan.

## 2023-10-10 DIAGNOSIS — F3130 Bipolar disorder, current episode depressed, mild or moderate severity, unspecified: Secondary | ICD-10-CM | POA: Diagnosis not present

## 2023-10-10 DIAGNOSIS — F411 Generalized anxiety disorder: Secondary | ICD-10-CM | POA: Diagnosis not present

## 2023-10-10 NOTE — Telephone Encounter (Signed)
 ELYXYB is covered only when pt is unable to take preferred product in a different dosage form. She has taken ibuprofen (caused gi bleed) in the past. She may be able to get approval for this. I called and LMVM for pt to return call about this medication, she ok to take?  Taken before?

## 2023-10-14 ENCOUNTER — Other Ambulatory Visit (HOSPITAL_COMMUNITY): Payer: Self-pay

## 2023-10-14 NOTE — Telephone Encounter (Signed)
 PRIOR AUTHORIZATION EXPIRES ON 09/29/24 NEXT AVAILABLE FILL DATE [id number] LAST FILL DT [id number] FILLED AT PHARMACY [facility]PHONE #[phone]

## 2023-10-17 DIAGNOSIS — F411 Generalized anxiety disorder: Secondary | ICD-10-CM | POA: Diagnosis not present

## 2023-10-17 DIAGNOSIS — F3130 Bipolar disorder, current episode depressed, mild or moderate severity, unspecified: Secondary | ICD-10-CM | POA: Diagnosis not present

## 2023-10-20 ENCOUNTER — Telehealth: Payer: Self-pay | Admitting: Pharmacist

## 2023-10-20 ENCOUNTER — Other Ambulatory Visit (HOSPITAL_COMMUNITY): Payer: Self-pay

## 2023-10-20 NOTE — Telephone Encounter (Signed)
 Appeal has been submitted for Elyxyb. Will advise when response is received, please be advised that most companies may take 30 days to make a decision. Appeal letter and supporting documentation have been faxed to [phone] on 10/20/2023 @3 :08 PM.  Thank you, Dellie Burns, PharmD Clinical Pharmacist  [facility]  Direct Dial: [phone]

## 2023-10-20 NOTE — Telephone Encounter (Signed)
 Information has been sent to clinical pharmacist for appeals review. It may take 5-7 days to prepare the necessary documentation to request the appeal from the insurance.

## 2023-10-24 DIAGNOSIS — F3130 Bipolar disorder, current episode depressed, mild or moderate severity, unspecified: Secondary | ICD-10-CM | POA: Diagnosis not present

## 2023-10-24 DIAGNOSIS — F411 Generalized anxiety disorder: Secondary | ICD-10-CM | POA: Diagnosis not present

## 2023-11-01 DIAGNOSIS — F3130 Bipolar disorder, current episode depressed, mild or moderate severity, unspecified: Secondary | ICD-10-CM | POA: Diagnosis not present

## 2023-11-01 DIAGNOSIS — F411 Generalized anxiety disorder: Secondary | ICD-10-CM | POA: Diagnosis not present

## 2023-11-07 DIAGNOSIS — F3130 Bipolar disorder, current episode depressed, mild or moderate severity, unspecified: Secondary | ICD-10-CM | POA: Diagnosis not present

## 2023-11-07 DIAGNOSIS — F411 Generalized anxiety disorder: Secondary | ICD-10-CM | POA: Diagnosis not present

## 2023-11-14 DIAGNOSIS — F411 Generalized anxiety disorder: Secondary | ICD-10-CM | POA: Diagnosis not present

## 2023-11-14 DIAGNOSIS — F3130 Bipolar disorder, current episode depressed, mild or moderate severity, unspecified: Secondary | ICD-10-CM | POA: Diagnosis not present

## 2023-11-21 DIAGNOSIS — F3130 Bipolar disorder, current episode depressed, mild or moderate severity, unspecified: Secondary | ICD-10-CM | POA: Diagnosis not present

## 2023-11-21 DIAGNOSIS — F411 Generalized anxiety disorder: Secondary | ICD-10-CM | POA: Diagnosis not present

## 2023-11-22 ENCOUNTER — Other Ambulatory Visit (HOSPITAL_COMMUNITY): Payer: Self-pay

## 2023-11-22 NOTE — Telephone Encounter (Signed)
 Per [employer], the appeal for Elyxyb has been approved for 12 months starting 11/09/2023.

## 2023-11-28 DIAGNOSIS — F411 Generalized anxiety disorder: Secondary | ICD-10-CM | POA: Diagnosis not present

## 2023-11-28 DIAGNOSIS — F3130 Bipolar disorder, current episode depressed, mild or moderate severity, unspecified: Secondary | ICD-10-CM | POA: Diagnosis not present

## 2023-12-08 DIAGNOSIS — F411 Generalized anxiety disorder: Secondary | ICD-10-CM | POA: Diagnosis not present

## 2023-12-08 DIAGNOSIS — F3130 Bipolar disorder, current episode depressed, mild or moderate severity, unspecified: Secondary | ICD-10-CM | POA: Diagnosis not present

## 2023-12-12 DIAGNOSIS — F411 Generalized anxiety disorder: Secondary | ICD-10-CM | POA: Diagnosis not present

## 2023-12-12 DIAGNOSIS — F3130 Bipolar disorder, current episode depressed, mild or moderate severity, unspecified: Secondary | ICD-10-CM | POA: Diagnosis not present

## 2023-12-19 DIAGNOSIS — F3130 Bipolar disorder, current episode depressed, mild or moderate severity, unspecified: Secondary | ICD-10-CM | POA: Diagnosis not present

## 2023-12-19 DIAGNOSIS — F411 Generalized anxiety disorder: Secondary | ICD-10-CM | POA: Diagnosis not present

## 2023-12-26 DIAGNOSIS — F411 Generalized anxiety disorder: Secondary | ICD-10-CM | POA: Diagnosis not present

## 2023-12-26 DIAGNOSIS — F3130 Bipolar disorder, current episode depressed, mild or moderate severity, unspecified: Secondary | ICD-10-CM | POA: Diagnosis not present

## 2024-01-02 DIAGNOSIS — F3130 Bipolar disorder, current episode depressed, mild or moderate severity, unspecified: Secondary | ICD-10-CM | POA: Diagnosis not present

## 2024-01-02 DIAGNOSIS — F411 Generalized anxiety disorder: Secondary | ICD-10-CM | POA: Diagnosis not present

## 2024-01-16 DIAGNOSIS — F3130 Bipolar disorder, current episode depressed, mild or moderate severity, unspecified: Secondary | ICD-10-CM | POA: Diagnosis not present

## 2024-01-16 DIAGNOSIS — F411 Generalized anxiety disorder: Secondary | ICD-10-CM | POA: Diagnosis not present

## 2024-01-18 ENCOUNTER — Other Ambulatory Visit (HOSPITAL_COMMUNITY): Payer: Self-pay

## 2024-01-18 ENCOUNTER — Telehealth: Payer: Self-pay

## 2024-01-18 NOTE — Telephone Encounter (Signed)
 Pharmacy Patient Advocate Encounter   Received notification from Fax that prior authorization for Emgality  120MG /ML auto-injectors (migraine) is required/requested.   Insurance verification completed.   The patient is insured through [facility] .   Per test claim: PA required; PA submitted to above mentioned insurance via CoverMyMeds Key/confirmation #/EOC [id number] Status is pending

## 2024-01-18 NOTE — Telephone Encounter (Signed)
 Pharmacy Patient Advocate Encounter  Received notification from [facility] that Prior Authorization for Emgality  120MG /ML auto-injectors (migraine) has been APPROVED from 01/18/2024 to 01/17/2025. Unable to obtain price due to refill too soon rejection, last fill date 01/09/2024 next available fill date6/18/2025   PA #/Case ID/Reference #: [id number]/A

## 2024-01-23 DIAGNOSIS — F3130 Bipolar disorder, current episode depressed, mild or moderate severity, unspecified: Secondary | ICD-10-CM | POA: Diagnosis not present

## 2024-01-23 DIAGNOSIS — F411 Generalized anxiety disorder: Secondary | ICD-10-CM | POA: Diagnosis not present

## 2024-01-30 DIAGNOSIS — F411 Generalized anxiety disorder: Secondary | ICD-10-CM | POA: Diagnosis not present

## 2024-01-30 DIAGNOSIS — F3130 Bipolar disorder, current episode depressed, mild or moderate severity, unspecified: Secondary | ICD-10-CM | POA: Diagnosis not present

## 2024-02-13 DIAGNOSIS — F411 Generalized anxiety disorder: Secondary | ICD-10-CM | POA: Diagnosis not present

## 2024-02-13 DIAGNOSIS — F3130 Bipolar disorder, current episode depressed, mild or moderate severity, unspecified: Secondary | ICD-10-CM | POA: Diagnosis not present

## 2024-02-20 DIAGNOSIS — F411 Generalized anxiety disorder: Secondary | ICD-10-CM | POA: Diagnosis not present

## 2024-02-20 DIAGNOSIS — F3130 Bipolar disorder, current episode depressed, mild or moderate severity, unspecified: Secondary | ICD-10-CM | POA: Diagnosis not present

## 2024-02-27 DIAGNOSIS — F3130 Bipolar disorder, current episode depressed, mild or moderate severity, unspecified: Secondary | ICD-10-CM | POA: Diagnosis not present

## 2024-02-27 DIAGNOSIS — F411 Generalized anxiety disorder: Secondary | ICD-10-CM | POA: Diagnosis not present

## 2024-02-28 DIAGNOSIS — E282 Polycystic ovarian syndrome: Secondary | ICD-10-CM | POA: Diagnosis not present

## 2024-02-28 DIAGNOSIS — Z6825 Body mass index (BMI) 25.0-25.9, adult: Secondary | ICD-10-CM | POA: Diagnosis not present

## 2024-02-28 DIAGNOSIS — Z3040 Encounter for surveillance of contraceptives, unspecified: Secondary | ICD-10-CM | POA: Diagnosis not present

## 2024-02-28 DIAGNOSIS — Z01419 Encounter for gynecological examination (general) (routine) without abnormal findings: Secondary | ICD-10-CM | POA: Diagnosis not present

## 2024-02-28 DIAGNOSIS — Z124 Encounter for screening for malignant neoplasm of cervix: Secondary | ICD-10-CM | POA: Diagnosis not present

## 2024-03-05 DIAGNOSIS — F411 Generalized anxiety disorder: Secondary | ICD-10-CM | POA: Diagnosis not present

## 2024-03-05 DIAGNOSIS — F3130 Bipolar disorder, current episode depressed, mild or moderate severity, unspecified: Secondary | ICD-10-CM | POA: Diagnosis not present

## 2024-03-08 DIAGNOSIS — M1711 Unilateral primary osteoarthritis, right knee: Secondary | ICD-10-CM | POA: Diagnosis not present

## 2024-03-09 ENCOUNTER — Other Ambulatory Visit: Payer: Self-pay | Admitting: Neurology

## 2024-03-09 NOTE — Telephone Encounter (Signed)
 Last seen on 08/03/23 Follow up scheduled on 08/22/24

## 2024-03-26 DIAGNOSIS — F411 Generalized anxiety disorder: Secondary | ICD-10-CM | POA: Diagnosis not present

## 2024-03-26 DIAGNOSIS — F3130 Bipolar disorder, current episode depressed, mild or moderate severity, unspecified: Secondary | ICD-10-CM | POA: Diagnosis not present

## 2024-04-02 DIAGNOSIS — F411 Generalized anxiety disorder: Secondary | ICD-10-CM | POA: Diagnosis not present

## 2024-04-02 DIAGNOSIS — F3130 Bipolar disorder, current episode depressed, mild or moderate severity, unspecified: Secondary | ICD-10-CM | POA: Diagnosis not present

## 2024-04-05 DIAGNOSIS — F411 Generalized anxiety disorder: Secondary | ICD-10-CM | POA: Diagnosis not present

## 2024-04-05 DIAGNOSIS — F3130 Bipolar disorder, current episode depressed, mild or moderate severity, unspecified: Secondary | ICD-10-CM | POA: Diagnosis not present

## 2024-04-12 DIAGNOSIS — F411 Generalized anxiety disorder: Secondary | ICD-10-CM | POA: Diagnosis not present

## 2024-04-12 DIAGNOSIS — F3130 Bipolar disorder, current episode depressed, mild or moderate severity, unspecified: Secondary | ICD-10-CM | POA: Diagnosis not present

## 2024-04-17 DIAGNOSIS — F3130 Bipolar disorder, current episode depressed, mild or moderate severity, unspecified: Secondary | ICD-10-CM | POA: Diagnosis not present

## 2024-04-17 DIAGNOSIS — F411 Generalized anxiety disorder: Secondary | ICD-10-CM | POA: Diagnosis not present

## 2024-04-23 DIAGNOSIS — F411 Generalized anxiety disorder: Secondary | ICD-10-CM | POA: Diagnosis not present

## 2024-04-23 DIAGNOSIS — F3130 Bipolar disorder, current episode depressed, mild or moderate severity, unspecified: Secondary | ICD-10-CM | POA: Diagnosis not present

## 2024-04-26 DIAGNOSIS — M2550 Pain in unspecified joint: Secondary | ICD-10-CM | POA: Diagnosis not present

## 2024-04-26 DIAGNOSIS — M1711 Unilateral primary osteoarthritis, right knee: Secondary | ICD-10-CM | POA: Diagnosis not present

## 2024-04-26 DIAGNOSIS — E063 Autoimmune thyroiditis: Secondary | ICD-10-CM | POA: Diagnosis not present

## 2024-04-26 DIAGNOSIS — L309 Dermatitis, unspecified: Secondary | ICD-10-CM | POA: Diagnosis not present

## 2024-04-30 DIAGNOSIS — F411 Generalized anxiety disorder: Secondary | ICD-10-CM | POA: Diagnosis not present

## 2024-04-30 DIAGNOSIS — F3130 Bipolar disorder, current episode depressed, mild or moderate severity, unspecified: Secondary | ICD-10-CM | POA: Diagnosis not present

## 2024-05-07 DIAGNOSIS — F3130 Bipolar disorder, current episode depressed, mild or moderate severity, unspecified: Secondary | ICD-10-CM | POA: Diagnosis not present

## 2024-05-07 DIAGNOSIS — F411 Generalized anxiety disorder: Secondary | ICD-10-CM | POA: Diagnosis not present

## 2024-05-14 DIAGNOSIS — F3130 Bipolar disorder, current episode depressed, mild or moderate severity, unspecified: Secondary | ICD-10-CM | POA: Diagnosis not present

## 2024-05-14 DIAGNOSIS — F411 Generalized anxiety disorder: Secondary | ICD-10-CM | POA: Diagnosis not present

## 2024-05-21 DIAGNOSIS — F411 Generalized anxiety disorder: Secondary | ICD-10-CM | POA: Diagnosis not present

## 2024-05-21 DIAGNOSIS — F3130 Bipolar disorder, current episode depressed, mild or moderate severity, unspecified: Secondary | ICD-10-CM | POA: Diagnosis not present

## 2024-05-28 DIAGNOSIS — F3130 Bipolar disorder, current episode depressed, mild or moderate severity, unspecified: Secondary | ICD-10-CM | POA: Diagnosis not present

## 2024-05-28 DIAGNOSIS — F411 Generalized anxiety disorder: Secondary | ICD-10-CM | POA: Diagnosis not present

## 2024-06-04 DIAGNOSIS — F3130 Bipolar disorder, current episode depressed, mild or moderate severity, unspecified: Secondary | ICD-10-CM | POA: Diagnosis not present

## 2024-06-04 DIAGNOSIS — F411 Generalized anxiety disorder: Secondary | ICD-10-CM | POA: Diagnosis not present

## 2024-06-11 DIAGNOSIS — F3130 Bipolar disorder, current episode depressed, mild or moderate severity, unspecified: Secondary | ICD-10-CM | POA: Diagnosis not present

## 2024-06-11 DIAGNOSIS — F411 Generalized anxiety disorder: Secondary | ICD-10-CM | POA: Diagnosis not present

## 2024-06-18 DIAGNOSIS — F411 Generalized anxiety disorder: Secondary | ICD-10-CM | POA: Diagnosis not present

## 2024-06-18 DIAGNOSIS — F3130 Bipolar disorder, current episode depressed, mild or moderate severity, unspecified: Secondary | ICD-10-CM | POA: Diagnosis not present

## 2024-06-25 DIAGNOSIS — F3130 Bipolar disorder, current episode depressed, mild or moderate severity, unspecified: Secondary | ICD-10-CM | POA: Diagnosis not present

## 2024-06-25 DIAGNOSIS — F411 Generalized anxiety disorder: Secondary | ICD-10-CM | POA: Diagnosis not present

## 2024-07-03 DIAGNOSIS — F411 Generalized anxiety disorder: Secondary | ICD-10-CM | POA: Diagnosis not present

## 2024-07-09 DIAGNOSIS — F3130 Bipolar disorder, current episode depressed, mild or moderate severity, unspecified: Secondary | ICD-10-CM | POA: Diagnosis not present

## 2024-07-09 DIAGNOSIS — F411 Generalized anxiety disorder: Secondary | ICD-10-CM | POA: Diagnosis not present

## 2024-07-19 DIAGNOSIS — F3130 Bipolar disorder, current episode depressed, mild or moderate severity, unspecified: Secondary | ICD-10-CM | POA: Diagnosis not present

## 2024-07-19 DIAGNOSIS — F411 Generalized anxiety disorder: Secondary | ICD-10-CM | POA: Diagnosis not present

## 2024-07-25 DIAGNOSIS — Z0000 Encounter for general adult medical examination without abnormal findings: Secondary | ICD-10-CM | POA: Diagnosis not present

## 2024-07-25 DIAGNOSIS — Z23 Encounter for immunization: Secondary | ICD-10-CM | POA: Diagnosis not present

## 2024-07-25 DIAGNOSIS — F3130 Bipolar disorder, current episode depressed, mild or moderate severity, unspecified: Secondary | ICD-10-CM | POA: Diagnosis not present

## 2024-07-25 DIAGNOSIS — M2550 Pain in unspecified joint: Secondary | ICD-10-CM | POA: Diagnosis not present

## 2024-07-25 DIAGNOSIS — J302 Other seasonal allergic rhinitis: Secondary | ICD-10-CM | POA: Diagnosis not present

## 2024-07-25 DIAGNOSIS — R7689 Other specified abnormal immunological findings in serum: Secondary | ICD-10-CM | POA: Diagnosis not present

## 2024-07-25 DIAGNOSIS — F411 Generalized anxiety disorder: Secondary | ICD-10-CM | POA: Diagnosis not present

## 2024-07-31 DIAGNOSIS — F411 Generalized anxiety disorder: Secondary | ICD-10-CM | POA: Diagnosis not present

## 2024-07-31 DIAGNOSIS — F3130 Bipolar disorder, current episode depressed, mild or moderate severity, unspecified: Secondary | ICD-10-CM | POA: Diagnosis not present

## 2024-08-21 ENCOUNTER — Ambulatory Visit: Admitting: Neurology

## 2024-08-22 ENCOUNTER — Telehealth: Payer: Commercial Managed Care - HMO | Admitting: Neurology

## 2024-09-12 ENCOUNTER — Other Ambulatory Visit: Payer: Self-pay

## 2024-09-12 DIAGNOSIS — G43001 Migraine without aura, not intractable, with status migrainosus: Secondary | ICD-10-CM

## 2024-09-12 MED ORDER — EMGALITY 120 MG/ML ~~LOC~~ SOAJ: 1.0000 mL | SUBCUTANEOUS | 5 refills | Status: AC

## 2024-09-12 NOTE — Progress Notes (Signed)
 Dr Sharion patient requesting Emgality  refill.

## 2024-09-12 NOTE — Telephone Encounter (Signed)
 Phone rep called pt, left vm asking pt to call office to r/s the cx appointment.

## 2024-09-14 NOTE — Telephone Encounter (Signed)
 [facility] called to request refill for PT MEDICATION Rimegepant Sulfate (NURTEC) 75 MG TBDP   Medication is to be sent to  [facility] [address] [place], [place] - [address] Phone: [phone]  Fax: [phone]

## 2024-09-19 NOTE — Telephone Encounter (Signed)
 Phone rep called pt to relay response from CMA and to assist in making an appointment with Dr Chalice, phone rep left a brief vm asking for a call to make needed appointment.
